# Patient Record
Sex: Female | Born: 1960 | Race: Black or African American | Hispanic: No | Marital: Married | Smoking: Never smoker
Health system: Southern US, Community
[De-identification: ages and names within clinical notes are randomized; demographics above are authoritative.]

## PROBLEM LIST (undated history)

## (undated) ENCOUNTER — Ambulatory Visit: Admission: EM | Payer: BC Managed Care – PPO | Source: Home / Self Care

## (undated) DIAGNOSIS — M25562 Pain in left knee: Secondary | ICD-10-CM

## (undated) DIAGNOSIS — J189 Pneumonia, unspecified organism: Secondary | ICD-10-CM

## (undated) DIAGNOSIS — M2550 Pain in unspecified joint: Secondary | ICD-10-CM

## (undated) DIAGNOSIS — J302 Other seasonal allergic rhinitis: Secondary | ICD-10-CM

## (undated) DIAGNOSIS — I10 Essential (primary) hypertension: Secondary | ICD-10-CM

## (undated) DIAGNOSIS — M1990 Unspecified osteoarthritis, unspecified site: Secondary | ICD-10-CM

## (undated) HISTORY — DX: Pain in unspecified joint: M25.50

## (undated) HISTORY — DX: Essential (primary) hypertension: I10

## (undated) HISTORY — DX: Pain in left knee: M25.562

---
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
## 2001-10-08 ENCOUNTER — Other Ambulatory Visit: Admission: RE | Admit: 2001-10-08 | Discharge: 2001-10-08 | Payer: Self-pay | Admitting: Obstetrics and Gynecology

## 2001-10-28 ENCOUNTER — Encounter: Payer: Self-pay | Admitting: Obstetrics and Gynecology

## 2001-10-28 ENCOUNTER — Ambulatory Visit (HOSPITAL_COMMUNITY): Admission: RE | Admit: 2001-10-28 | Discharge: 2001-10-28 | Payer: Self-pay | Admitting: Obstetrics and Gynecology

## 2002-09-23 ENCOUNTER — Other Ambulatory Visit: Admission: RE | Admit: 2002-09-23 | Discharge: 2002-09-23 | Payer: Self-pay | Admitting: Obstetrics and Gynecology

## 2002-11-17 ENCOUNTER — Ambulatory Visit (HOSPITAL_COMMUNITY): Admission: RE | Admit: 2002-11-17 | Discharge: 2002-11-17 | Payer: Self-pay | Admitting: Family Medicine

## 2002-11-17 ENCOUNTER — Encounter: Payer: Self-pay | Admitting: Family Medicine

## 2002-11-20 ENCOUNTER — Encounter: Payer: Self-pay | Admitting: Internal Medicine

## 2002-11-21 ENCOUNTER — Encounter: Payer: Self-pay | Admitting: Family Medicine

## 2002-11-21 ENCOUNTER — Inpatient Hospital Stay (HOSPITAL_COMMUNITY): Admission: EM | Admit: 2002-11-21 | Discharge: 2002-11-26 | Payer: Self-pay | Admitting: Emergency Medicine

## 2002-11-22 ENCOUNTER — Encounter: Payer: Self-pay | Admitting: Family Medicine

## 2002-11-23 ENCOUNTER — Encounter: Payer: Self-pay | Admitting: Family Medicine

## 2002-11-25 ENCOUNTER — Encounter: Payer: Self-pay | Admitting: Critical Care Medicine

## 2002-12-10 ENCOUNTER — Encounter: Payer: Self-pay | Admitting: Family Medicine

## 2002-12-10 ENCOUNTER — Ambulatory Visit (HOSPITAL_COMMUNITY): Admission: RE | Admit: 2002-12-10 | Discharge: 2002-12-10 | Payer: Self-pay | Admitting: Family Medicine

## 2003-09-15 ENCOUNTER — Ambulatory Visit (HOSPITAL_COMMUNITY): Admission: RE | Admit: 2003-09-15 | Discharge: 2003-09-15 | Payer: Self-pay | Admitting: Family Medicine

## 2003-09-15 ENCOUNTER — Encounter: Payer: Self-pay | Admitting: Family Medicine

## 2003-12-08 ENCOUNTER — Other Ambulatory Visit: Admission: RE | Admit: 2003-12-08 | Discharge: 2003-12-08 | Payer: Self-pay | Admitting: Obstetrics and Gynecology

## 2003-12-12 ENCOUNTER — Ambulatory Visit (HOSPITAL_COMMUNITY): Admission: RE | Admit: 2003-12-12 | Discharge: 2003-12-12 | Payer: Self-pay | Admitting: Family Medicine

## 2004-01-23 ENCOUNTER — Ambulatory Visit (HOSPITAL_COMMUNITY): Admission: RE | Admit: 2004-01-23 | Discharge: 2004-01-23 | Payer: Self-pay | Admitting: Family Medicine

## 2004-04-30 ENCOUNTER — Ambulatory Visit (HOSPITAL_COMMUNITY): Admission: RE | Admit: 2004-04-30 | Discharge: 2004-04-30 | Payer: Self-pay | Admitting: Family Medicine

## 2004-04-30 ENCOUNTER — Ambulatory Visit (HOSPITAL_COMMUNITY): Admission: RE | Admit: 2004-04-30 | Discharge: 2004-04-30 | Payer: Self-pay | Admitting: Otolaryngology

## 2004-05-25 ENCOUNTER — Ambulatory Visit (HOSPITAL_COMMUNITY): Admission: RE | Admit: 2004-05-25 | Discharge: 2004-05-25 | Payer: Self-pay | Admitting: Neurology

## 2004-12-11 ENCOUNTER — Ambulatory Visit: Payer: Self-pay | Admitting: Family Medicine

## 2004-12-11 ENCOUNTER — Ambulatory Visit (HOSPITAL_COMMUNITY): Admission: RE | Admit: 2004-12-11 | Discharge: 2004-12-11 | Payer: Self-pay | Admitting: Family Medicine

## 2004-12-24 ENCOUNTER — Ambulatory Visit (HOSPITAL_COMMUNITY): Admission: RE | Admit: 2004-12-24 | Discharge: 2004-12-24 | Payer: Self-pay | Admitting: Family Medicine

## 2005-03-07 ENCOUNTER — Ambulatory Visit (HOSPITAL_COMMUNITY): Admission: RE | Admit: 2005-03-07 | Discharge: 2005-03-07 | Payer: Self-pay | Admitting: Obstetrics and Gynecology

## 2005-03-07 ENCOUNTER — Encounter (INDEPENDENT_AMBULATORY_CARE_PROVIDER_SITE_OTHER): Payer: Self-pay | Admitting: *Deleted

## 2005-03-14 ENCOUNTER — Ambulatory Visit: Payer: Self-pay | Admitting: Family Medicine

## 2005-03-18 ENCOUNTER — Ambulatory Visit (HOSPITAL_COMMUNITY): Admission: RE | Admit: 2005-03-18 | Discharge: 2005-03-18 | Payer: Self-pay | Admitting: Family Medicine

## 2005-04-24 ENCOUNTER — Other Ambulatory Visit: Admission: RE | Admit: 2005-04-24 | Discharge: 2005-04-24 | Payer: Self-pay | Admitting: Obstetrics and Gynecology

## 2005-07-05 ENCOUNTER — Ambulatory Visit (HOSPITAL_COMMUNITY): Admission: RE | Admit: 2005-07-05 | Discharge: 2005-07-05 | Payer: Self-pay | Admitting: Family Medicine

## 2005-07-05 ENCOUNTER — Ambulatory Visit: Payer: Self-pay | Admitting: Family Medicine

## 2005-10-23 ENCOUNTER — Ambulatory Visit: Payer: Self-pay | Admitting: Family Medicine

## 2006-02-25 ENCOUNTER — Ambulatory Visit: Payer: Self-pay | Admitting: Family Medicine

## 2006-03-03 ENCOUNTER — Ambulatory Visit (HOSPITAL_COMMUNITY): Admission: RE | Admit: 2006-03-03 | Discharge: 2006-03-03 | Payer: Self-pay | Admitting: Family Medicine

## 2006-07-21 ENCOUNTER — Ambulatory Visit: Payer: Self-pay | Admitting: Family Medicine

## 2006-11-26 ENCOUNTER — Ambulatory Visit: Payer: Self-pay | Admitting: Family Medicine

## 2006-12-08 ENCOUNTER — Ambulatory Visit: Payer: Self-pay | Admitting: Orthopedic Surgery

## 2007-01-19 ENCOUNTER — Ambulatory Visit: Payer: Self-pay | Admitting: Orthopedic Surgery

## 2007-03-04 ENCOUNTER — Ambulatory Visit: Payer: Self-pay | Admitting: Family Medicine

## 2007-03-10 ENCOUNTER — Ambulatory Visit (HOSPITAL_COMMUNITY): Admission: RE | Admit: 2007-03-10 | Discharge: 2007-03-10 | Payer: Self-pay | Admitting: Family Medicine

## 2007-03-17 ENCOUNTER — Ambulatory Visit: Payer: Self-pay | Admitting: Orthopedic Surgery

## 2008-01-14 ENCOUNTER — Inpatient Hospital Stay (HOSPITAL_COMMUNITY): Admission: AD | Admit: 2008-01-14 | Discharge: 2008-01-14 | Payer: Self-pay | Admitting: Obstetrics and Gynecology

## 2008-01-17 ENCOUNTER — Inpatient Hospital Stay (HOSPITAL_COMMUNITY): Admission: AD | Admit: 2008-01-17 | Discharge: 2008-01-21 | Payer: Self-pay | Admitting: Obstetrics

## 2008-01-18 ENCOUNTER — Encounter (INDEPENDENT_AMBULATORY_CARE_PROVIDER_SITE_OTHER): Payer: Self-pay | Admitting: Obstetrics and Gynecology

## 2008-01-26 ENCOUNTER — Inpatient Hospital Stay (HOSPITAL_COMMUNITY): Admission: AD | Admit: 2008-01-26 | Discharge: 2008-01-28 | Payer: Self-pay | Admitting: Obstetrics

## 2008-02-01 ENCOUNTER — Ambulatory Visit: Payer: Self-pay | Admitting: Family Medicine

## 2008-04-06 DIAGNOSIS — K219 Gastro-esophageal reflux disease without esophagitis: Secondary | ICD-10-CM

## 2008-04-06 DIAGNOSIS — I10 Essential (primary) hypertension: Secondary | ICD-10-CM | POA: Insufficient documentation

## 2008-04-06 DIAGNOSIS — E669 Obesity, unspecified: Secondary | ICD-10-CM

## 2008-04-07 ENCOUNTER — Ambulatory Visit: Payer: Self-pay | Admitting: Family Medicine

## 2008-09-19 ENCOUNTER — Telehealth: Payer: Self-pay | Admitting: Family Medicine

## 2008-09-20 ENCOUNTER — Ambulatory Visit: Payer: Self-pay | Admitting: Family Medicine

## 2008-09-20 DIAGNOSIS — J111 Influenza due to unidentified influenza virus with other respiratory manifestations: Secondary | ICD-10-CM | POA: Insufficient documentation

## 2008-09-20 DIAGNOSIS — J209 Acute bronchitis, unspecified: Secondary | ICD-10-CM | POA: Insufficient documentation

## 2008-11-18 ENCOUNTER — Encounter: Payer: Self-pay | Admitting: Family Medicine

## 2008-11-21 ENCOUNTER — Telehealth: Payer: Self-pay | Admitting: Family Medicine

## 2008-12-20 ENCOUNTER — Ambulatory Visit: Payer: Self-pay | Admitting: Family Medicine

## 2008-12-20 DIAGNOSIS — E049 Nontoxic goiter, unspecified: Secondary | ICD-10-CM

## 2008-12-26 ENCOUNTER — Ambulatory Visit (HOSPITAL_COMMUNITY): Admission: RE | Admit: 2008-12-26 | Discharge: 2008-12-26 | Payer: Self-pay | Admitting: Family Medicine

## 2008-12-27 ENCOUNTER — Telehealth: Payer: Self-pay | Admitting: Family Medicine

## 2009-01-05 ENCOUNTER — Other Ambulatory Visit: Admission: RE | Admit: 2009-01-05 | Discharge: 2009-01-05 | Payer: Self-pay | Admitting: Otolaryngology

## 2009-01-05 ENCOUNTER — Encounter: Payer: Self-pay | Admitting: Family Medicine

## 2009-02-16 ENCOUNTER — Telehealth: Payer: Self-pay | Admitting: Family Medicine

## 2009-09-08 ENCOUNTER — Ambulatory Visit (HOSPITAL_COMMUNITY): Admission: RE | Admit: 2009-09-08 | Discharge: 2009-09-08 | Payer: Self-pay | Admitting: Otolaryngology

## 2009-09-11 ENCOUNTER — Encounter: Payer: Self-pay | Admitting: Family Medicine

## 2009-09-14 ENCOUNTER — Ambulatory Visit: Payer: Self-pay | Admitting: Family Medicine

## 2009-09-14 DIAGNOSIS — J0110 Acute frontal sinusitis, unspecified: Secondary | ICD-10-CM | POA: Insufficient documentation

## 2009-09-14 DIAGNOSIS — J019 Acute sinusitis, unspecified: Secondary | ICD-10-CM | POA: Insufficient documentation

## 2009-09-14 LAB — CONVERTED CEMR LAB
BUN: 21 mg/dL (ref 6–23)
CO2: 24 meq/L (ref 19–32)
Chloride: 107 meq/L (ref 96–112)
Eosinophils Relative: 2 % (ref 0–5)
Glucose, Bld: 93 mg/dL (ref 70–99)
HCT: 37.9 % (ref 36.0–46.0)
Hemoglobin: 11.8 g/dL — ABNORMAL LOW (ref 12.0–15.0)
LDL Cholesterol: 113 mg/dL — ABNORMAL HIGH (ref 0–99)
Lymphocytes Relative: 39 % (ref 12–46)
Lymphs Abs: 1.7 10*3/uL (ref 0.7–4.0)
Monocytes Absolute: 0.6 10*3/uL (ref 0.1–1.0)
Monocytes Relative: 13 % — ABNORMAL HIGH (ref 3–12)
Neutro Abs: 1.9 10*3/uL (ref 1.7–7.7)
Potassium: 4.5 meq/L (ref 3.5–5.3)
RBC: 3.91 M/uL (ref 3.87–5.11)
RDW: 13.1 % (ref 11.5–15.5)
Sodium: 144 meq/L (ref 135–145)
Total CHOL/HDL Ratio: 3.2
VLDL: 10 mg/dL (ref 0–40)
WBC: 4.3 10*3/uL (ref 4.0–10.5)

## 2009-09-15 LAB — CONVERTED CEMR LAB: Retic Ct Pct: 0.8 % (ref 0.4–3.1)

## 2009-09-18 ENCOUNTER — Encounter: Payer: Self-pay | Admitting: Family Medicine

## 2009-11-28 ENCOUNTER — Ambulatory Visit: Payer: Self-pay | Admitting: Family Medicine

## 2010-01-10 ENCOUNTER — Telehealth: Payer: Self-pay | Admitting: Family Medicine

## 2010-01-30 ENCOUNTER — Ambulatory Visit: Payer: Self-pay | Admitting: Family Medicine

## 2010-01-30 DIAGNOSIS — R51 Headache: Secondary | ICD-10-CM | POA: Insufficient documentation

## 2010-01-30 DIAGNOSIS — R519 Headache, unspecified: Secondary | ICD-10-CM | POA: Insufficient documentation

## 2010-01-31 ENCOUNTER — Telehealth: Payer: Self-pay | Admitting: Family Medicine

## 2010-02-01 ENCOUNTER — Telehealth: Payer: Self-pay | Admitting: Family Medicine

## 2010-02-01 ENCOUNTER — Telehealth (INDEPENDENT_AMBULATORY_CARE_PROVIDER_SITE_OTHER): Payer: Self-pay | Admitting: *Deleted

## 2010-02-02 DIAGNOSIS — J309 Allergic rhinitis, unspecified: Secondary | ICD-10-CM | POA: Insufficient documentation

## 2010-02-05 ENCOUNTER — Ambulatory Visit (HOSPITAL_COMMUNITY): Admission: RE | Admit: 2010-02-05 | Discharge: 2010-02-05 | Payer: Self-pay | Admitting: Family Medicine

## 2010-02-19 ENCOUNTER — Ambulatory Visit (HOSPITAL_COMMUNITY): Admission: RE | Admit: 2010-02-19 | Discharge: 2010-02-19 | Payer: Self-pay | Admitting: Family Medicine

## 2010-03-01 ENCOUNTER — Telehealth: Payer: Self-pay | Admitting: Family Medicine

## 2010-05-04 ENCOUNTER — Telehealth: Payer: Self-pay | Admitting: Family Medicine

## 2010-06-27 ENCOUNTER — Ambulatory Visit: Payer: Self-pay | Admitting: Family Medicine

## 2010-06-27 DIAGNOSIS — G4730 Sleep apnea, unspecified: Secondary | ICD-10-CM | POA: Insufficient documentation

## 2010-06-27 DIAGNOSIS — R238 Other skin changes: Secondary | ICD-10-CM | POA: Insufficient documentation

## 2010-06-29 ENCOUNTER — Encounter: Payer: Self-pay | Admitting: Family Medicine

## 2010-06-29 LAB — CONVERTED CEMR LAB
Basophils Absolute: 0 10*3/uL (ref 0.0–0.1)
CO2: 23 meq/L (ref 19–32)
Calcium: 9.7 mg/dL (ref 8.4–10.5)
Creatinine, Ser: 0.89 mg/dL (ref 0.40–1.20)
Eosinophils Absolute: 0.1 10*3/uL (ref 0.0–0.7)
Eosinophils Relative: 2 % (ref 0–5)
HDL: 47 mg/dL (ref >39)
Lymphocytes Relative: 45 % (ref 12–46)
Lymphs Abs: 1.9 10*3/uL (ref 0.7–4.0)
Neutrophils Relative %: 42 % — ABNORMAL LOW (ref 43–77)
Platelets: 345 10*3/uL (ref 150–400)
RDW: 13 % (ref 11.5–15.5)
Sodium: 142 meq/L (ref 135–145)
TSH: 2.718 microintl units/mL (ref 0.350–4.500)
Total CHOL/HDL Ratio: 3.7
Triglycerides: 55 mg/dL (ref <150)
WBC: 4.1 10*3/uL (ref 4.0–10.5)
aPTT: 29 s (ref 24–37)

## 2010-10-11 ENCOUNTER — Ambulatory Visit (HOSPITAL_COMMUNITY): Admission: RE | Admit: 2010-10-11 | Discharge: 2010-10-11 | Payer: Self-pay | Admitting: Otolaryngology

## 2010-10-29 ENCOUNTER — Ambulatory Visit: Payer: Self-pay | Admitting: Family Medicine

## 2010-10-29 DIAGNOSIS — J040 Acute laryngitis: Secondary | ICD-10-CM | POA: Insufficient documentation

## 2010-11-20 ENCOUNTER — Ambulatory Visit: Payer: Self-pay | Admitting: Family Medicine

## 2010-11-20 ENCOUNTER — Encounter: Payer: Self-pay | Admitting: Family Medicine

## 2010-11-27 ENCOUNTER — Ambulatory Visit: Payer: Self-pay | Admitting: Family Medicine

## 2010-12-30 ENCOUNTER — Encounter: Payer: Self-pay | Admitting: Neurology

## 2010-12-30 ENCOUNTER — Encounter: Payer: Self-pay | Admitting: Family Medicine

## 2011-01-08 NOTE — Progress Notes (Signed)
Summary: referral  Phone Note Call from Patient   Summary of Call: pt had to have percert for MRI of brain and number was a [phone] Initial call taken by: Rudene Anda,  February 01, 2010 10:09 AM

## 2011-01-08 NOTE — Letter (Signed)
Summary: Out of Work  [facility]  [address]   [place], [place] [address]   Phone: [phone]  Fax: [phone]    October 29, 2010   Employee:  ATLANTIS DELONG    To Whom It May Concern:   For Medical reasons, please excuse the above named employee from work for the following dates:  Start:   10/29/10  End:   10/31/10 to return with no restrictions  If you need additional information, please feel free to contact our office.         Sincerely,    Milus Mallick. Lodema Hong, MD

## 2011-01-08 NOTE — Progress Notes (Signed)
Summary: MEDS  Phone Note Call from Patient   Summary of Call: IS GOING OUT OF AND SHE FEELS HOT, COLD, CHILLS, AND ACHY WOULD YOU CALL IN SOME TAMFLU OR SOMETHING SO SHE WILL FEEL BETTER ON HER TRIP  SHE LEFT MESSAGE   CALL BACK AT [phone] Initial call taken by: Lind Guest,  January 10, 2010 10:17 AM  Follow-up for Phone Call        returned call, no answer Follow-up by: Worthy Keeler LPN,  January 10, 2010 10:46 AM  Additional Follow-up for Phone Call Additional follow up Details #1::        chills and body aches, not sure if having fever off and on cough is going out of town, needs somthing called in Additional Follow-up by: Worthy Keeler LPN,  January 10, 2010 10:52 AM    Additional Follow-up for Phone Call Additional follow up Details #2::    How long has she had these symptoms?  Is her cough productive or any sinus congestion, etc?   Follow-up by: Esperanza Sheets PA,  January 10, 2010 11:25 AM  Additional Follow-up for Phone Call Additional follow up Details #3:: Details for Additional Follow-up Action Taken: returned call, left message Additional Follow-up by: Worthy Keeler LPN,  January 11, 2010 10:05 AM  New/Updated Medications: ZITHROMAX Z-PAK 250 MG TABS (AZITHROMYCIN) uad Prescriptions: ZITHROMAX Z-PAK 250 MG TABS (AZITHROMYCIN) uad  #1 x 0   Entered by:   Worthy Keeler LPN   Authorized by:   Syliva Overman MD   Signed by:   Worthy Keeler LPN on 84/13/2440   Method used:   Electronically to        [facility] [address] [facility])       [address]       [place], [place]  [address]       Ph: [phone]       Fax: [phone]   RxID:   [id number]  symptoms since monday, clear nasal drainage, non productive cough, no sinus congestion  Advise pt that Tamiflu is for Influenza & has to be started within the 1st 48 hrs of symptoms.  I recommend that she use an OTC cough medication, increase fluids, & may use Tylenol or Ibuprofen for aches.   If she is already doing these things & needs something more for cough please advise me & I will Rx cough med for her.  Or if symptoms are worsening then she should have an appt.  Esperanza Sheets PA  January 11, 2010 11:04 AM since going out of town and hasa past h/o pneumonia requiring iCU admission erx z pack x 1 , let pt know if she actually starts tasking this and begins to worsen she needs to see an MD wherever she is   called patient, left message, Worthy Keeler LPN  January 11, 2010 2:11 PM  called patient, left message, Worthy Keeler LPN  January 12, 2010 11:27 AM  called patient, left message, Worthy Keeler LPN  January 15, 2010 8:37 AM

## 2011-01-08 NOTE — Assessment & Plan Note (Signed)
Summary: OV   Vital Signs:  Patient profile:   50 year old female Menstrual status:  regular Height:      66.5 inches Weight:      275 pounds BMI:     43.88 O2 Sat:      97 % Pulse rate:   94 / minute Pulse rhythm:   regular Resp:     16 per minute BP sitting:   120 / 80  (left arm) Cuff size:   xl  Vitals Entered By: Everitt Amber LPN (January 30, 2010 3:27 PM)  Nutrition Counseling: Patient's BMI is greater than 25 and therefore counseled on weight management options. CC: having pain,  starts in the top of her head, down into the bridge of her knows and into right ear. Lots of pain and pressure. Feels off balance   CC:  having pain, starts in the top of her head, and down into the bridge of her knows and into right ear. Lots of pain and pressure. Feels off balance.  History of Present Illness: pressure from thee crown of her head flowing down toward both sides of her nose, she has also had intermittent episodes of vertigo. symptoms started on the 10th feb returned on the 14th and has been internittent, mild sore throat, has had chills with this occasionally, no fever   Current Medications (verified): 1)  Flonase 50 Mcg/act Susp (Fluticasone Propionate) .... Two Puffs Each Nostril Bid 2)  Labetalol Hcl 200 Mg Tabs (Labetalol Hcl) .... Take 1 Tablet By Mouth Once A Day 3)  Fexofenadine Hcl 180 Mg Tabs (Fexofenadine Hcl) .... Take 1 Tablet By Mouth Two Times A Day 4)  Phentermine Hcl 37.5 Mg Tabs (Phentermine Hcl) .... Take 1 Tablet By Mouth Once A Day  Allergies (verified): No Known Drug Allergies  Review of Systems General:  Denies chills. Eyes:  Denies blurring and discharge. ENT:  Complains of nasal congestion, postnasal drainage, and sore throat; denies sinus pressure. CV:  Denies chest pain or discomfort, palpitations, and swelling of feet. Resp:  Denies cough and sputum productive. GI:  Denies abdominal pain, constipation, diarrhea, nausea, and vomiting. GU:  Denies  dysuria and urinary frequency. MS:  Denies joint pain, low back pain, mid back pain, and stiffness. Derm:  Denies itching and rash. Neuro:  Complains of headaches and sensation of room spinning; denies seizures and visual disturbances; new right sided headache in the past week, past h/o abn MRI brain ..question was raised in the past about possiblems , but tests were negative. Psych:  Denies anxiety and depression. Endo:  Denies cold intolerance, excessive hunger, excessive thirst, excessive urination, heat intolerance, polyuria, and weight change. Heme:  Denies abnormal bruising and bleeding. Allergy:  Complains of itching eyes, seasonal allergies, and sneezing; uncontrolled, pt non com[pliant with meds.  Physical Exam  General:  Well-developed,obese,in no acute distress; alert,appropriate and cooperative throughout examination HEENT: No facial asymmetry,  EOMI, No sinus tenderness, TM's Clear, oropharynx  pink and moist.erythema and edemaof nasal mucosa   Chest: Clear to auscultation bilaterally.  CVS: S1, S2, No murmurs, No S3.   Abd: Soft, Nontender.  MS: Adequate ROM spine, hips, shoulders and knees.  Ext: No edema.   CNS: CN 2-12 intact, power tone and sensation normal throughout.   Skin: Intact, no visible lesions or rashes.  Psych: Good eye contact, normal affect.  Memory intact, not anxious or depressed appearing.    Impression & Recommendations:  Problem # 1:  HEADACHE (ICD-784.0) Assessment Deteriorated  Her updated medication list for this problem includes:    Labetalol Hcl 200 Mg Tabs (Labetalol hcl) .Marland Kitchen... Take 1 tablet by mouth once a day  Future Orders: Radiology Referral (Radiology) ... 02/01/2010  Problem # 2:  OBESITY (ICD-278.00) Assessment: Improved  Ht: 66.5 (01/30/2010)   Wt: 275 (01/30/2010)   BMI: 43.88 (01/30/2010)  Problem # 3:  HYPERTENSION (ICD-401.9) Assessment: Unchanged  Her updated medication list for this problem includes:    Labetalol Hcl  200 Mg Tabs (Labetalol hcl) .Marland Kitchen... Take 1 tablet by mouth once a day  BP today: 120/80 Prior BP: 122/70 (11/28/2009)  Labs Reviewed: K+: 4.5 (09/11/2009) Creat: : 0.79 (09/11/2009)   Chol: 179 (09/11/2009)   HDL: 56 (09/11/2009)   LDL: 113 (09/11/2009)   TG: 52 (09/11/2009)  Problem # 4:  ALLERGIC RHINITIS (ICD-477.9) Assessment: Deteriorated  Her updated medication list for this problem includes:    Flonase 50 Mcg/act Susp (Fluticasone propionate) .Marland Kitchen..Marland Kitchen Two puffs each nostril bid    Fexofenadine Hcl 180 Mg Tabs (Fexofenadine hcl) .Marland Kitchen... Take 1 tablet by mouth once a day  Complete Medication List: 1)  Flonase 50 Mcg/act Susp (Fluticasone propionate) .... Two puffs each nostril bid 2)  Labetalol Hcl 200 Mg Tabs (Labetalol hcl) .... Take 1 tablet by mouth once a day 3)  Phentermine Hcl 37.5 Mg Tabs (Phentermine hcl) .... Take 1 tablet by mouth once a day 4)  Septra Ds 800-160 Mg Tabs (Sulfamethoxazole-trimethoprim) .... Take 1 tablet by mouth two times a day 5)  Fluconazole 150 Mg Tabs (Fluconazole) .... Take 1 tablet by mouth once a day 6)  Fexofenadine Hcl 180 Mg Tabs (Fexofenadine hcl) .... Take 1 tablet by mouth once a day  Other Orders: Future Orders: Radiology Referral (Radiology) ... 01/31/2010  Patient Instructions: 1)  Please schedule a follow-up appointment in 2 months. 2)  It is important that you exercise regularly at least 20 minutes 5 times a week. If you develop chest pain, have severe difficulty breathing, or feel very tired , stop exercising immediately and seek medical attention. 3)  You need to lose weight. Consider a lower calorie diet and regular exercise. Congrats on weight loss, keep it up pls 4)  you will  get a script of septra to use if needed  in the next week for sinuses. 5)  you will be referred for an mRI of the brain and mamo Prescriptions: FLUCONAZOLE 150 MG TABS (FLUCONAZOLE) Take 1 tablet by mouth once a day  #3 x 0   Entered by:   Everitt Amber LPN    Authorized by:   Syliva Overman MD   Signed by:   Adella Hare LPN on 32/44/0102   Method used:   Handwritten   RxID:   [id number] SEPTRA DS 800-160 MG TABS (SULFAMETHOXAZOLE-TRIMETHOPRIM) Take 1 tablet by mouth two times a day  #20 x 0   Entered by:   Everitt Amber LPN   Authorized by:   Syliva Overman MD   Signed by:   Adella Hare LPN on 56/38/7564   Method used:   Handwritten   RxID:   [id number] FLONASE 50 MCG/ACT SUSP (FLUTICASONE PROPIONATE) TWO PUFFS EACH NOSTRIL BID  #37MONTH x 0   Entered by:   Everitt Amber LPN   Authorized by:   Syliva Overman MD   Signed by:   Everitt Amber LPN on 16/12/930   Method used:   Electronically to        [facility]  [address]       [place], [place]  [address]       Ph: [phone]       Fax: [phone]   RxID:   [id number] PHENTERMINE HCL 37.5 MG TABS (PHENTERMINE HCL) Take 1 tablet by mouth once a day  #30 x 0   Entered and Authorized by:   Syliva Overman MD   Signed by:   Syliva Overman MD on 01/30/2010   Method used:   Printed then faxed to ...       [facility] [address] [facility] ([facility])       [address]       [place], [place]  [address]       Ph: [phone]       Fax: [phone]   RxID:   [id number] FEXOFENADINE HCL 180 MG TABS (FEXOFENADINE HCL) Take 1 tablet by mouth two times a day  #60 x 5   Entered and Authorized by:   Syliva Overman MD   Signed by:   Syliva Overman MD on 01/30/2010   Method used:   Printed then faxed to ...       [facility] [address] [facility] ([facility]ail)       [address]       [place], [place]  [address]       Ph: [phone]       Fax: [phone]   RxID:   [id number]

## 2011-01-08 NOTE — Progress Notes (Signed)
  Phone Note From Pharmacy   Caller: [facility]  [address] [id number] Summary of Call: fexofenidine dose of two times a day exceeds maximun dose, needs to be changed to once daily  Initial call taken by: Adella Hare LPN,  February 01, 2010 11:16 AM  Follow-up for Phone Call        dose changed to once daily. pls advise py Follow-up by: Syliva Overman MD,  February 01, 2010 11:46 AM  Additional Follow-up for Phone Call Additional follow up Details #1::        called patient, left message Additional Follow-up by: Adella Hare LPN,  February 01, 2010 1:47 PM    New/Updated Medications: FEXOFENADINE HCL 180 MG TABS (FEXOFENADINE HCL) Take 1 tablet by mouth once a day Prescriptions: FEXOFENADINE HCL 180 MG TABS (FEXOFENADINE HCL) Take 1 tablet by mouth once a day  #30 x 4   Entered and Authorized by:   Syliva Overman MD   Signed by:   Syliva Overman MD on 02/01/2010   Method used:   Electronically to        [facility] [address] [facility]       [address]       [place], [place]  [address]       Ph: [phone]       Fax: [phone]   RxID:   [id number]

## 2011-01-08 NOTE — Assessment & Plan Note (Signed)
Summary: F UP   Vital Signs:  Patient profile:   50 year old female Menstrual status:  regular Height:      66.5 inches Weight:      272.75 pounds BMI:     43.52 O2 Sat:      96 % on Room air Temp:     98.8 degrees F oral Pulse rate:   80 / minute Pulse rhythm:   regular Resp:     16 per minute BP sitting:   140 / 80  (left arm)  Vitals Entered By: Adella Hare LPN (October 29, 2010 4:37 PM)  Nutrition Counseling: Patient's BMI is greater than 25 and therefore counseled on weight management options.  O2 Flow:  Room air CC: follow up Is Patient Diabetic? No Pain Assessment Patient in pain? no      Comments complains of some sinus pressure   CC:  follow up.  History of Present Illness: 3 day h/o hoarseness and loss of voice , intermittent chill. Contact with sick inc walking pneumionia. Not experiencing any head congestion or nasal pressure. she has hadno fevr. she has not been invested in lifestyle cange to facilitate weight loss, and since running out of diet pills, she has gained weight which she had lost  Current Medications (verified): 1)  Labetalol Hcl 200 Mg Tabs (Labetalol Hcl) .... Take 1 Tablet By Mouth Once A Day 2)  Fexofenadine Hcl 180 Mg Tabs (Fexofenadine Hcl) .... Take 1 Tablet By Mouth Once A Day 3)  Flonase 50 Mcg/act Susp (Fluticasone Propionate) .... 2 Puffs in Each Nostril Two Times A Day  Allergies (verified): 1)  ! Septra   Impression & Recommendations:  Problem # 1:  LARYNGITIS, ACUTE (ICD-464.00) Assessment Comment Only voice rest , and fluids  Problem # 2:  ACUTE BRONCHITIS (ICD-466.0) Assessment: Comment Only  Her updated medication list for this problem includes:    Tessalon Perles 100 Mg Caps (Benzonatate) .Marland Kitchen... Take 1 capsule by mouth three times a day    Penicillin V Potassium 500 Mg Tabs (Penicillin v potassium) .Marland Kitchen... Take 1 tablet by mouth three times a day  Orders: Rocephin  250mg  (J5009) Admin of Therapeutic Inj   intramuscular or subcutaneous (38182)  Problem # 3:  OBESITY (ICD-278.00) Assessment: Unchanged  Ht: 66.5 (10/29/2010)   Wt: 272.75 (10/29/2010)   BMI: 43.52 (10/29/2010) therapeutic lifestyle change discussed and encouraged  pt to resume phentermine half daily  Problem # 4:  HYPERTENSION (ICD-401.9) Assessment: Deteriorated  Her updated medication list for this problem includes:    Labetalol Hcl 200 Mg Tabs (Labetalol hcl) .Marland Kitchen... Take 1 tablet by mouth once a day  BP today: 140/80 Prior BP: 120/82 (06/27/2010)  Labs Reviewed: K+: 4.5 (06/27/2010) Creat: : 0.89 (06/27/2010)   Chol: 173 (06/27/2010)   HDL: 47 (06/27/2010)   LDL: 115 (06/27/2010)   TG: 55 (06/27/2010)  Complete Medication List: 1)  Labetalol Hcl 200 Mg Tabs (Labetalol hcl) .... Take 1 tablet by mouth once a day 2)  Fexofenadine Hcl 180 Mg Tabs (Fexofenadine hcl) .... Take 1 tablet by mouth once a day 3)  Flonase 50 Mcg/act Susp (Fluticasone propionate) .... 2 puffs in each nostril two times a day 4)  Tessalon Perles 100 Mg Caps (Benzonatate) .... Take 1 capsule by mouth three times a day 5)  Penicillin V Potassium 500 Mg Tabs (Penicillin v potassium) .... Take 1 tablet by mouth three times a day 6)  Phentermine Hcl 37.5 Mg Tabs (Phentermine hcl) .... One  tab by mouth once daily  Patient Instructions: 1)  Please schedule a follow-up appointment in 4 months. 2)  It is important that you exercise regularly at least 20 minutes 5 times a week. If you develop chest pain, have severe difficulty breathing, or feel very tired , stop exercising immediately and seek medical attention. 3)  You need to lose weight. Consider a lower calorie diet and regular exercise. 4)  You are being treated for laryngitis and bronchitis, voice rest, salt water gargles 3 times daily, fluids and meds as discussed. 5)  injection today. 6)  Work excuse for tomorrow. 7)  Happy holidays Prescriptions: PHENTERMINE HCL 37.5 MG TABS (PHENTERMINE HCL)  one tab by mouth once daily  #30 x 1   Entered by:   Adella Hare LPN   Authorized by:   Syliva Overman MD   Signed by:   Adella Hare LPN on 16/09/9603   Method used:   Printed then faxed to ...       [facility] [address] [facility]       [address]       [place], [place]  [address]       Ph: [phone]       Fax: [phone]   RxID:   [id number] PENICILLIN V POTASSIUM 500 MG TABS (PENICILLIN V POTASSIUM) Take 1 tablet by mouth three times a day  #21 x 0   Entered and Authorized by:   Syliva Overman MD   Signed by:   Syliva Overman MD on 10/29/2010   Method used:   Electronically to        [facility] [address] [facility]       [address]       [place], [place]  [address]       Ph: [phone]       Fax: [phone]   RxID:   [id number] TESSALON PERLES 100 MG CAPS (BENZONATATE) Take 1 capsule by mouth three times a day  #21 x 0   Entered and Authorized by:   Syliva Overman MD   Signed by:   Syliva Overman MD on 10/29/2010   Method used:   Electronically to        [facility] [address] [facility]       [address]       [place], [place]  [address]       Ph: [phone]       Fax: [phone]   RxID:   [id number]    Medication Administration  Injection # 1:    Medication: Rocephin  250mg     Diagnosis: ACUTE BRONCHITIS (ICD-466.0)    Route: IM    Site: RUOQ gluteus    Exp Date: 05/14    Lot #: [id number]    Mfr: [email or web address]    Comments: rocephin 500mg  given    Patient tolerated injection without complications    Given by: Adella Hare LPN (October 29, 2010 5:08 PM)  Orders Added: 1)  Est. Patient Level IV [69485] 2)  Rocephin  250mg  [J0696] 3)  Admin of Therapeutic Inj  intramuscular or subcutaneous [96372]     Medication Administration  Injection # 1:    Medication: Rocephin  250mg     Diagnosis: ACUTE BRONCHITIS (ICD-466.0)    Route: IM    Site: RUOQ gluteus  Exp Date: 05/14    Lot  #: [id number]    Mfr: [email or web address]    Comments: rocephin 500mg  given    Patient tolerated injection without complications    Given by: Adella Hare LPN (October 29, 2010 5:08 PM)  Orders Added: 1)  Est. Patient Level IV [96295] 2)  Rocephin  250mg  [J0696] 3)  Admin of Therapeutic Inj  intramuscular or subcutaneous [28413]

## 2011-01-08 NOTE — Progress Notes (Signed)
Summary: BP MEDICINE  Phone Note Call from Patient   Summary of Call: NEEDS HER BP MEDICINE SENT TO [facility] [place] CALL BACK AT [phone] Initial call taken by: Lind Guest,  May 04, 2010 11:40 AM    Prescriptions: LABETALOL HCL 200 MG TABS (LABETALOL HCL) Take 1 tablet by mouth once a day  #30 Each x 3   Entered by:   Everitt Amber LPN   Authorized by:   Syliva Overman MD   Signed by:   Everitt Amber LPN on 19/14/7829   Method used:   Electronically to        [facility] [address] ([facility])       [address]       [place], [place]  [address]       Ph: [phone]       Fax: [phone]   RxID:   [id number]

## 2011-01-08 NOTE — Letter (Signed)
Summary: Letter  Letter   Imported By: Lind Guest 07/02/2010 12:55:15  _____________________________________________________________________  External Attachment:    Type:   Image     Comment:   External Document

## 2011-01-08 NOTE — Assessment & Plan Note (Signed)
Summary: office visit   Vital Signs:  Patient profile:   50 year old female Menstrual status:  regular Height:      66.5 inches Weight:      266.25 pounds BMI:     42.48 O2 Sat:      98 % Pulse rate:   84 / minute Pulse rhythm:   regular Resp:     16 per minute BP sitting:   120 / 82  (left arm)  Vitals Entered By: Everitt Amber LPN (June 27, 2010 9:04 AM)  Nutrition Counseling: Patient's BMI is greater than 25 and therefore counseled on weight management options. CC: Follow up chronic problems, has a bruise on left leg that she wants checked   CC:  Follow up chronic problems and has a bruise on left leg that she wants checked.  History of Present Illness: Reports  that she has been  doing well. she is now committed to regular exercise , which she states helps her weight more than anything else. She is also tolerating th phentermine well with good results.  Denies recent fever or chills. Denies sinus pressure, nasal congestion , ear pain or sore throat. Denies chest congestion, or cough productive of sputum. Denies chest pain, palpitations, PND, orthopnea or leg swelling. Denies abdominal pain, nausea, vomitting, diarrhea or constipation. Denies change in bowel movements or bloody stool. Denies dysuria , frequency, incontinence or hesitancy. Denies  joint pain, swelling, or reduced mobility. Denies headaches, vertigo, seizures. Denies depression, anxiety or insomnia. Denies  rash, lesions, or itch.     Current Medications (verified): 1)  Labetalol Hcl 200 Mg Tabs (Labetalol Hcl) .... Take 1 Tablet By Mouth Once A Day 2)  Phentermine Hcl 37.5 Mg Tabs (Phentermine Hcl) .... Take 1 Tablet By Mouth Once A Day 3)  Fexofenadine Hcl 180 Mg Tabs (Fexofenadine Hcl) .... Take 1 Tablet By Mouth Once A Day  Allergies (verified): 1)  ! Septra  Review of Systems      See HPI Eyes:  Denies blurring and discharge. Derm:  pt feels she has yeast infeection due to malodor post  exercise, under arm, and under the breasts, no rash has been seen. Endo:  Denies cold intolerance, excessive hunger, excessive thirst, excessive urination, heat intolerance, polyuria, and weight change. Heme:  Complains of abnormal bruising; left leg bruise x 3 weeks, a little discomfort initially, none at this tiome no fever or chills, no h/o direct trauma , but states she bruises easily. central area clear, my expressed opinion is that this represents an insect bite, eoith no infection currently. Allergy:  Complains of seasonal allergies; denies hives or rash and itching eyes.  Physical Exam  General:  Well-developed,obese,in no acute distress; alert,appropriate and cooperative throughout examination HEENT: No facial asymmetry,  EOMI, No sinus tenderness, TM's Clear, oropharynx  pink and moist.erythema and edemaof nasal mucosa   Chest: Clear to auscultation bilaterally.  CVS: S1, S2, No murmurs, No S3.   Abd: Soft, Nontender.  MS: Adequate ROM spine, hips, shoulders and knees.  Ext: No edema.   CNS: CN 2-12 intact, power tone and sensation normal throughout.   Skin: Intact, no  rashes. old bruise on leg, no sign of infection Psych: Good eye contact, normal affect.  Memory intact, not anxious or depressed appearing.    Impression & Recommendations:  Problem # 1:  ECCHYMOSES (ICD-782.9) Assessment Comment Only ptt to be checked  Problem # 2:  ALLERGIC RHINITIS (ICD-477.9) Assessment: Improved  The following medications were  removed from the medication list:    Flonase 50 Mcg/act Susp (Fluticasone propionate) .Marland Kitchen..Marland Kitchen Two puffs each nostril bid    Fexofenadine Hcl 180 Mg Tabs (Fexofenadine hcl) .Marland Kitchen... Take 1 tablet by mouth once a day Her updated medication list for this problem includes:    Fexofenadine Hcl 180 Mg Tabs (Fexofenadine hcl) .Marland Kitchen... Take 1 tablet by mouth once a day    Flonase 50 Mcg/act Susp (Fluticasone propionate) .Marland Kitchen... 2 puffs in each nostril two times a day  Problem #  3:  OBESITY (ICD-278.00) Assessment: Improved  Ht: 66.5 (06/27/2010)   Wt: 266.25 (06/27/2010)   BMI: 42.48 (06/27/2010)  Problem # 4:  HYPERTENSION (ICD-401.9) Assessment: Unchanged  Her updated medication list for this problem includes:    Labetalol Hcl 200 Mg Tabs (Labetalol hcl) .Marland Kitchen... Take 1 tablet by mouth once a day  Orders: T-Basic Metabolic Panel ([id number])  BP today: 120/82 Prior BP: 120/80 (01/30/2010)  Labs Reviewed: K+: 4.5 (09/11/2009) Creat: : 0.79 (09/11/2009)   Chol: 179 (09/11/2009)   HDL: 56 (09/11/2009)   LDL: 113 (09/11/2009)   TG: 52 (09/11/2009)  Problem # 5:  GERD (ICD-530.81)  Complete Medication List: 1)  Labetalol Hcl 200 Mg Tabs (Labetalol hcl) .... Take 1 tablet by mouth once a day 2)  Phentermine Hcl 37.5 Mg Tabs (Phentermine hcl) .... Take 1 tablet by mouth once a day 3)  Fexofenadine Hcl 180 Mg Tabs (Fexofenadine hcl) .... Take 1 tablet by mouth once a day 4)  Fluconazole 150 Mg Tabs (Fluconazole) .... Take 1 tablet by mouth once a day 5)  Flonase 50 Mcg/act Susp (Fluticasone propionate) .... 2 puffs in each nostril two times a day  Other Orders: T-Lipid Profile ([id number]) T-TSH [id number]) T-CBC w/Diff [phone] [id number]) T-Vitamin D (25-Hydroxy) (24401-02725) T-PTT ([id number])  Patient Instructions: 1)  Please schedule a follow-up appointment in 3.5 to 4 months. 2)  It is important that you exercise regularly at least 60 minutes 7 times a week. If you develop chest pain, have severe difficulty breathing, or feel very tired , stop exercising immediately and seek medical attention. 3)  You need to lose weight. Consider a lower calorie diet and regular exercise. goal is 12 pounds 4)  BMP prior to visit, ICD-9: 5)  Lipid Panel prior to visit, ICD-9: 6)  TSH prior to visit, ICD-9:   afsting today 7)  CBC w/ Diff prior to visit, ICD-9 8)  Vitamin D 9)  pt/pTT Prescriptions: FLONASE 50 MCG/ACT SUSP (FLUTICASONE PROPIONATE) 2 puffs in each  nostril two times a day  #14month x 4   Entered by:   Everitt Amber LPN   Authorized by:   Syliva Overman MD   Signed by:   Everitt Amber LPN on 25/95/6387   Method used:   Electronically to        [facility]  [address] (retail)       [address]       [place], [place]  [address]       Ph: [phone]       Fax: [phone]   RxID:   [id number] FEXOFENADINE HCL 180 MG TABS (FEXOFENADINE HCL) Take 1 tablet by mouth once a day  #30 x 3   Entered by:   Everitt Amber LPN   Authorized by:   Syliva Overman MD   Signed by:   Everitt Amber LPN on 62/37/6283   Method used:   Printed then faxed to .Marland KitchenMarland Kitchen  [facility]  [address] [address] (retail)       [address]       [place], [place]  [address]       Ph: [phone]       Fax: [phone]   RxID:   [id number] PHENTERMINE HCL 37.5 MG TABS (PHENTERMINE HCL) Take 1 tablet by mouth once a day  #30 x 1   Entered by:   Everitt Amber LPN   Authorized by:   Syliva Overman MD   Signed by:   Everitt Amber LPN on 44/12/270   Method used:   Printed then faxed to ...       [facility]  [address] [address] (retail)       [address]       [place], [place]  [address]       Ph: [phone]       Fax: [phone]   RxID:   [id number] FLUCONAZOLE 150 MG TABS (FLUCONAZOLE) Take 1 tablet by mouth once a day  #3 x 0   Entered and Authorized by:   Syliva Overman MD   Signed by:   Syliva Overman MD on 06/27/2010   Method used:   Electronically to        [facility]  [address] [address] (retail)       [address]       [place], [place]  [address]       Ph: [phone]       Fax: [phone]   RxID:   [id number]

## 2011-01-08 NOTE — Progress Notes (Signed)
Summary: antibotic not working  Phone Note Call from Patient   Summary of Call: pt is really congestioned and has a antibotic but not working. can dr. Lodema Hong call her in something else. [phone] Initial call taken by: Rudene Anda,  March 01, 2010 11:34 AM  Follow-up for Phone Call        states she started taking the septra prescribed last month, for sinus infection, septra doesnt agree with her, causes nausea, can you write somthing else?  Follow-up by: Adella Hare LPN,  March 01, 2010 1:15 PM  Additional Follow-up for Phone Call Additional follow up Details #1::        pls send in z pack x 1 no refills and let her know if this is not working needs ov, alsopadd to record adverse effect with septra...nausea Additional Follow-up by: Syliva Overman MD,  March 01, 2010 4:29 PM   New Allergies: ! SEPTRA Additional Follow-up for Phone Call Additional follow up Details #2::    patient aware Follow-up by: Adella Hare LPN,  March 01, 2010 4:41 PM  New/Updated Medications: ZITHROMAX Z-PAK 250 MG TABS (AZITHROMYCIN) uad New Allergies: ! SEPTRAPrescriptions: ZITHROMAX Z-PAK 250 MG TABS (AZITHROMYCIN) uad  #1 x 0   Entered by:   Adella Hare LPN   Authorized by:   Syliva Overman MD   Signed by:   Adella Hare LPN on 45/40/9811   Method used:   Electronically to        [facility]  [address] [place] [address] (retail)       [address]       [place], [place]  [address]       Ph: [phone]       Fax: [phone]   RxID:   [id number]

## 2011-01-08 NOTE — Progress Notes (Signed)
Summary: refill  Phone Note Call from Patient   Summary of Call: the pill for allergies she couldn't get. said they had to call doc back [phone] Initial call taken by: Rudene Anda,  January 31, 2010 10:05 AM  Follow-up for Phone Call        fexofenadine requires pa Follow-up by: Adella Hare LPN,  January 31, 2010 10:25 AM  Additional Follow-up for Phone Call Additional follow up Details #1::        let pt know and erx claritn or zyrtec if she agrees, explain she may have to 'fail thesebefore getting the fexofenadine Additional Follow-up by: Syliva Overman MD,  January 31, 2010 11:50 AM    Additional Follow-up for Phone Call Additional follow up Details #2::    called patient, left message Follow-up by: Everitt Amber LPN,  January 31, 2010 3:51 PM  Additional Follow-up for Phone Call Additional follow up Details #3:: Details for Additional Follow-up Action Taken: Patient states she will just pay out of pocket. RX sent to [facility]  Additional Follow-up by: Everitt Amber LPN,  January 31, 2010 4:00 PM  Prescriptions: FEXOFENADINE HCL 180 MG TABS (FEXOFENADINE HCL) Take 1 tablet by mouth two times a day  #60 x 5   Entered by:   Everitt Amber LPN   Authorized by:   Syliva Overman MD   Signed by:   Everitt Amber LPN on 56/21/3086   Method used:   Electronically to        [facility]  [address] [address] (retail)       [address]       [place], [place]  [address]       Ph: [phone]       Fax: [phone]   RxID:   [id number]

## 2011-01-10 NOTE — Assessment & Plan Note (Signed)
Summary: flu shot  Nurse Visit   Allergies: 1)  ! Septra  Immunizations Administered:  Influenza Vaccine # 1:    Vaccine Type: Fluvax Non-MCR    Site: right deltoid    Mfr: GlaxoSmithKline    Dose: 0.5 ml    Route: IM    Given by: Adella Hare LPN    Exp. Date: 06/08/2011    Lot #: [id number]    VIS given: 07/03/10 version given November 27, 2010.  Orders Added: 1)  Influenza Vaccine NON MCR [00028]

## 2011-01-10 NOTE — Assessment & Plan Note (Signed)
Summary: flu shot/slj  Nurse Visit   Allergies: 1)  ! Septra

## 2011-02-04 ENCOUNTER — Telehealth (INDEPENDENT_AMBULATORY_CARE_PROVIDER_SITE_OTHER): Payer: Self-pay | Admitting: *Deleted

## 2011-02-04 ENCOUNTER — Encounter: Payer: Self-pay | Admitting: Family Medicine

## 2011-02-04 ENCOUNTER — Ambulatory Visit (INDEPENDENT_AMBULATORY_CARE_PROVIDER_SITE_OTHER): Payer: BC Managed Care – PPO | Admitting: Family Medicine

## 2011-02-04 DIAGNOSIS — R7301 Impaired fasting glucose: Secondary | ICD-10-CM | POA: Insufficient documentation

## 2011-02-04 DIAGNOSIS — J019 Acute sinusitis, unspecified: Secondary | ICD-10-CM

## 2011-02-04 DIAGNOSIS — I10 Essential (primary) hypertension: Secondary | ICD-10-CM

## 2011-02-04 DIAGNOSIS — E669 Obesity, unspecified: Secondary | ICD-10-CM

## 2011-02-05 ENCOUNTER — Other Ambulatory Visit: Payer: Self-pay | Admitting: Family Medicine

## 2011-02-05 DIAGNOSIS — Z139 Encounter for screening, unspecified: Secondary | ICD-10-CM

## 2011-02-14 NOTE — Progress Notes (Signed)
Summary: sinus infection  Phone Note Call from Patient   Summary of Call: pt would like for doc to call her in a zpack. has a sinus infection and she feels bad and going out of town. [phone] Initial call taken by: Rudene Anda,  February 04, 2011 10:51 AM  Follow-up for Phone Call        needs to be seen,3;!5 TODAY IS OPEN PLS LET HER KNOW Follow-up by: Syliva Overman MD,  February 04, 2011 12:28 PM  Additional Follow-up for Phone Call Additional follow up Details #1::        left message on her cell # to come in at 3:15 Additional Follow-up by: Lind Guest,  February 04, 2011 1:33 PM    Additional Follow-up for Phone Call Additional follow up Details #2::    left  message Follow-up by: Lind Guest,  February 04, 2011 2:35 PM  Additional Follow-up for Phone Call Additional follow up Details #3:: Details for Additional Follow-up Action Taken: patient in the office now for a visit Additional Follow-up by: Lind Guest,  February 04, 2011 3:23 PM

## 2011-02-19 NOTE — Assessment & Plan Note (Signed)
Summary: per dr   Vital Signs:  Patient profile:   50 year old female Menstrual status:  regular Height:      66.5 inches Weight:      262 pounds BMI:     41.80 Temp:     98.7 degrees F Pulse rhythm:   regular Resp:     16 per minute BP sitting:   120 / 72  (left arm)  Vitals Entered By: Everitt Amber LPN (February 04, 2011 3:29 PM)  Nutrition Counseling: Patient's BMI is greater than 25 and therefore counseled on weight management options. CC: c/o sinus and chest congestion, chills, cough, not able to bring anything up x 4 days   CC:  c/o sinus and chest congestion, chills, cough, and not able to bring anything up x 4 days.  History of Present Illness: 1 week h/o heasd and chest congestion, nasal congestion, yellow , blooduy post nasal drainage.. body aches today, and chills, no documented fever. Reports  that prior to this she had been doing well. she is very busy between work and schookl, but has still managed to work at weight loss through lifestyle change, with some success, which is commendable.  Denies chest pain, palpitations, PND, orthopnea or leg swelling. Denies abdominal pain, nausea, vomitting, diarrhea or constipation. Denies change in bowel movements or bloody stool. Denies dysuria , frequency, incontinence or hesitancy. Denies  joint pain, swelling, or reduced mobility. Denies headaches, vertigo, seizures. Denies depression, anxiety or insomnia. Denies  rash, lesions, or itch.     Current Medications (verified): 1)  Labetalol Hcl 200 Mg Tabs (Labetalol Hcl) .... Take 1 Tablet By Mouth Once A Day 2)  Fexofenadine Hcl 180 Mg Tabs (Fexofenadine Hcl) .... Take 1 Tablet By Mouth Once A Day 3)  Phentermine Hcl 37.5 Mg Tabs (Phentermine Hcl) .... One Tab By Mouth Once Daily  Allergies (verified): 1)  ! Septra  Review of Systems      See HPI Eyes:  Denies blurring and discharge. Endo:  Denies excessive thirst and excessive urination. Heme:  Denies abnormal  bruising, bleeding, enlarge lymph nodes, and pallor. Allergy:  Complains of seasonal allergies; denies hives or rash and itching eyes.  Physical Exam  General:  Well-developedobese,in no acute distress; alert,appropriate and cooperative throughout examination HEENT: No facial asymmetry,  EOMI,right maxillary sinus tenderness, TM's Clear, oropharynx  pink and moist.   Chest: clear CVS: S1, S2, No murmurs, No S3.   Abd: Soft, Nontender.  MS: Adequate ROM spine, hips, shoulders and knees.  Ext: No edema.   CNS: CN 2-12 intact, power tone and sensation normal throughout.   Skin: Intact, no visible lesions or rashes.  Psych: Good eye contact, normal affect.  Memory intact, not anxious or depressed appearing.    Impression & Recommendations:  Problem # 1:  ACUTE SINUSITIS, UNSPECIFIED (ICD-461.9) Assessment Comment Only  The following medications were removed from the medication list:    Flonase 50 Mcg/act Susp (Fluticasone propionate) .Marland Kitchen... 2 puffs in each nostril two times a day    Tessalon Perles 100 Mg Caps (Benzonatate) .Marland Kitchen... Take 1 capsule by mouth three times a day    Penicillin V Potassium 500 Mg Tabs (Penicillin v potassium) .Marland Kitchen... Take 1 tablet by mouth three times a day Her updated medication list for this problem includes:    Zithromax 250 Mg Tabs (Azithromycin) .Marland Kitchen..Marland Kitchen Two tablets on day one , and one tablet daily for an additional 4 days    Tessalon Perles 100 Mg Caps (  Benzonatate) .Marland Kitchen... Take 1 capsule by mouth three times a day  Orders: Rocephin  250mg  (V4098) Admin of Therapeutic Inj  intramuscular or subcutaneous (11914)  Problem # 2:  HYPERTENSION (ICD-401.9) Assessment: Improved  BP today: 120/72 Prior BP: 140/80 (10/29/2010)  Labs Reviewed: K+: 4.5 (06/27/2010) Creat: : 0.89 (06/27/2010)   Chol: 173 (06/27/2010)   HDL: 47 (06/27/2010)   LDL: 115 (06/27/2010)   TG: 55 (06/27/2010)  Problem # 3:  OBESITY (ICD-278.00) Assessment: Improved  Ht: 66.5  (02/04/2011)   Wt: 262 (02/04/2011)   BMI: 41.80 (02/04/2011)  Complete Medication List: 1)  Labetalol Hcl 200 Mg Tabs (Labetalol hcl) .... Take 1 tablet by mouth once a day 2)  Fexofenadine Hcl 180 Mg Tabs (Fexofenadine hcl) .... Take 1 tablet by mouth once a day 3)  Zithromax 250 Mg Tabs (Azithromycin) .... Two tablets on day one , and one tablet daily for an additional 4 days 4)  Tessalon Perles 100 Mg Caps (Benzonatate) .... Take 1 capsule by mouth three times a day 5)  Phentermine Hcl 37.5 Mg Tabs (Phentermine hcl) .... Take 1 tablet by mouth once a day  Other Orders: T-Basic Metabolic Panel [id number]) T-Lipid Profile [phone]) T-CBC w/Diff [id number]) T- Hemoglobin A1C ([id number]) T-TSH ([id number]) Future Orders: Radiology Referral (Radiology) ... 02/05/2011  Patient Instructions: 1)  Please schedule a follow-up appointment in 4 months. 2)  It is important that you exercise regularly at least 20 minutes 5 times a week. If you develop chest pain, have severe difficulty breathing, or feel very tired , stop exercising immediately and seek medical attention. 3)  You need to lose weight. Consider a lower calorie diet and regular exercise.  4)  You are being treated for right maxillary sinusitis, you will get injection in the office Prescriptions: LABETALOL HCL 200 MG TABS (LABETALOL HCL) Take 1 tablet by mouth once a day  #30 Each x 4   Entered by:   Everitt Amber LPN   Authorized by:   Syliva Overman MD   Signed by:   Everitt Amber LPN on 56/38/7564   Method used:   Electronically to        [facility]  [address] (retail)       [address]       [place], [place]  [address]       Ph: [phone]       Fax: [phone]   RxID:   [id number] PHENTERMINE HCL 37.5 MG TABS (PHENTERMINE HCL) Take 1 tablet by mouth once a day  #30 x 3   Entered and Authorized by:   Syliva Overman MD   Signed by:   Syliva Overman MD on 02/04/2011   Method used:    Printed then faxed to ...       [facility]  [address] [address] (retail)       [address]       [place], [place]  [address]       Ph: [phone]       Fax: [phone]   RxID:   [id number] TESSALON PERLES 100 MG CAPS (BENZONATATE) Take 1 capsule by mouth three times a day  #30 x 0   Entered and Authorized by:   Syliva Overman MD   Signed by:   Syliva Overman MD on 02/04/2011   Method used:   Electronically to        [facility]  [address] [place] [address] (retail)       [address]       [place], [place]  [address]       Ph: [phone]       Fax: [phone]   RxID:   [id number] ZITHROMAX 250 MG TABS (AZITHROMYCIN) two tablets on day one , and one tablet daily for an additional 4 days  #6 x 0   Entered and Authorized by:   Syliva Overman MD   Signed by:   Syliva Overman MD on 02/04/2011   Method used:   Electronically to        [facility] [address] (retail)       [address]       [place], [place]  [address]       Ph: [phone]       Fax: [phone]   RxID:   [id number]    Medication Administration  Injection # 1:    Medication: Rocephin  250mg     Diagnosis: ACUTE SINUSITIS, UNSPECIFIED (ICD-461.9)    Route: IM    Site: LUOQ gluteus    Exp Date: 07/2013    Lot #: [id number]    Mfr: [email or web address]    Comments: 500mg  given     Patient tolerated injection without complications    Given by: Everitt Amber LPN (February 04, 2011 4:23 PM)  Orders Added: 1)  Est. Patient Level IV [99214] 2)  T-Basic Metabolic Panel [phone] 3)  T-Lipid Profile [80061-22930] 4)  T-CBC w/Diff [28315-17616] 5)  T- Hemoglobin A1C [83036-23375] 6)  T-TSH [[id number]] 7)  Rocephin  250mg  [J0696] 8)  Admin of Therapeutic Inj  intramuscular or subcutaneous [96372] 9)  Radiology Referral [Radiology]     Medication Administration  Injection # 1:    Medication: Rocephin  250mg     Diagnosis: ACUTE SINUSITIS, UNSPECIFIED (ICD-461.9)    Route:  IM    Site: LUOQ gluteus    Exp Date: 07/2013    Lot #: [id number]    Mfr: [email or web address]    Comments: 500mg  given     Patient tolerated injection without complications    Given by: Everitt Amber LPN (February 04, 2011 4:23 PM)  Orders Added: 1)  Est. Patient Level IV [99214] 2)  T-Basic Metabolic Panel [[id number]] 3)  T-Lipid Profile [80061-22930] 4)  T-CBC w/Diff [70350-09381] 5)  T- Hemoglobin A1C [83036-23375] 6)  T-TSH [82993-71696] 7)  Rocephin  250mg  [J0696] 8)  Admin of Therapeutic Inj  intramuscular or subcutaneous [96372] 9)  Radiology Referral [Radiology]

## 2011-02-25 ENCOUNTER — Ambulatory Visit (HOSPITAL_COMMUNITY): Payer: BC Managed Care – PPO

## 2011-03-04 ENCOUNTER — Ambulatory Visit: Payer: Self-pay | Admitting: Family Medicine

## 2011-04-23 NOTE — Op Note (Signed)
[name], [name]                ACCOUNT NO.:  [id number]   MEDICAL RECORD NO.:  [id number]          PATIENT TYPE:  INP   LOCATION:  9144                          FACILITY:  [facility]   PHYSICIAN:  Maxie Better, M.D.DATE OF BIRTH:  [birth year]   DATE OF PROCEDURE:  01/18/2008  DATE OF DISCHARGE:                               OPERATIVE REPORT   PREOPERATIVE DIAGNOSIS:  Arrest of dilatation, nonreassuring fetal  tracing, term gestation.   PROCEDURE:  Primary cesarean section, Kerr hysterotomy.   POSTOPERATIVE DIAGNOSIS:  Fibroid uterus, arrest of dilatation,  nonreassuring fetal heart tracing, term gestation, occiput posterior  presentation.   ANESTHESIA:  Epidural.   SURGEON:  Maxie Better, M.D.   ASSISTANT:  [name], C.N.M.   PROCEDURE IN DETAIL:  Under adequate epidural anesthesia, the patient is  placed in the supine position with a left lateral tilt.  An indwelling  Foley catheter was already in place and was draining concentrated urine.  The patient was prepped and draped in the usual fashion.  0.25% Marcaine  was injected in the planned Pfannenstiel skin incision site.  A  Pfannenstiel skin incision was then made and carried down to the rectus  fascia.  The rectus fascia was opened transversely.  The rectus fascia  was then bluntly and sharply dissected off the rectus muscle in a  superior inferior fashion. The rectus muscles were split in the midline,  the parietal peritoneum was entered bluntly and extended.  Copious fluid  was initially encountered.  The vesicouterine peritoneum was opened  transversely. The bladder was then bluntly dissected off the lower  uterine segment and displaced inferiorly.  A curvilinear low transverse  uterine incision was then made and extended with bandage scissors. On  entering the uterine cavity, the fetal head was encountered.  A live  female was delivered from right occiput posterior presentation.  The baby  was bulb  suctioned on the abdomen, the cord was clamped, the baby was  transferred to the awaiting pediatrician who assigned Apgars 6 and 9 at  1 and 5 minutes.  The placenta was removed and sent to pathology intact.  It had some calcification.  The uterine cavity was cleaned of debris.  The uterine incision had no extension.  It was closed in two layers, the  first layer 0 Monocryl running locked stitch, the second layer was  imbricated using 0 Monocryl suture.  Intermittent bleeding sites were  then hemostased with 0 Monocryl figure-of-eight sutures.  The abdomen  was then copiously irrigated and suctioned of debris.  Normal tubes and  ovaries were noted bilaterally.  There was a pedunculated about 3 cm  right anterior fundal fibroid noted and a  smaller subserosal fibroid  seen anteriorly.  There was a 6 cm posterior serosal fibroid in the mid  body of the uterus.  The paracolic gutters were cleaned of debris.  Good  hemostasis was noted along the incision site.  Small bleeders were  cauterized.  The parietoperitoneum was closed with 2-0 Vicryl in a  running stitch.  The rectus fascia was closed with 0 Vicryl x2.  The  subcutaneous was irrigated and small bleeders cauterized.  The skin was  approximated using Ethicon staples.   SPECIMENS:  Placenta sent to pathology.   ESTIMATED BLOOD LOSS:  1200 mL.   INTRAOPERATIVE FLUIDS:  2600 mL.   URINE OUTPUT:  100 mL.   COUNTS:  Sponge and instrument counts x2 was correct.   COMPLICATIONS:  None.   Weight of the baby was 6 pounds 11 ounces.  The patient tolerated the  procedure well and was transferred to recovery in stable condition.      Maxie Better, M.D.  Electronically Signed     [place]/MEDQ  D:  01/18/2008  T:  01/19/2008  Job:  [id number]

## 2011-04-26 NOTE — Group Therapy Note (Signed)
   [name], [name]                          ACCOUNT NO.:  [id number]   MEDICAL RECORD NO.:  [id number]                   PATIENT TYPE:  INP   LOCATION:  A219                                 FACILITY:  [facility]   PHYSICIAN:  Edward L. Juanetta Gosling, M.D.             DATE OF BIRTH:  10/21/1961   DATE OF PROCEDURE:  DATE OF DISCHARGE:                                   PROGRESS NOTE   SUBJECTIVE:  The patient says she is feeling better.  She has no new  complaints and says that she had a pretty good night last night.  She is  coughing up some sputum now.   OBJECTIVE:  Her examination today shows that her temperature is 99.5, pulse  85, respirations 20, blood pressure 120/59, O2 saturation 93%; this on mask  O2.  Her I&O +837.  Her chest shows bilateral rhonchi that is no worse than  it was.  Her heart is regular. Her abdomen is soft.   ASSESSMENT:  She does seem to be doing a little bit better.   PLAN:  To continue with her treatments.  She has lab work pending.                                               Edward L. Juanetta Gosling, M.D.    [facility]/MEDQ  D:  11/22/2002  T:  11/23/2002  Job:  [id number]

## 2011-04-26 NOTE — H&P (Signed)
[name], [name]                          ACCOUNT NO.:  [id number]   MEDICAL RECORD NO.:  [id number]                   PATIENT TYPE:  INP   LOCATION:  A219                                 FACILITY:  [facility]   PHYSICIAN:  Milus Mallick. Lodema Hong, M.D.           DATE OF BIRTH:  [birth year]   DATE OF ADMISSION:  11/20/2002  DATE OF DISCHARGE:                                HISTORY & PHYSICAL   CHIEF COMPLAINT:  This patient is a 50 year old African-American female who  presents with a 2-week history of generalized malaise, postnasal drainage,  neck pain, headache, and multiple aches. The patient reports having received  a flu shot earlier in this flu season. She was initially seen by her family  M.D., Dr. Lodema Hong.  Since her admission, she was started on Folex-D, as well  as Septra, one twice daily for sinus infection.  She again returned to the  office one week later, stating that she felt progressively worse with  periods of weakness, chills, and low-grade fever.  At that time, it was  decided to do x-rays of her chest and sinuses and some basic lab work.  Significant lab work showed a white cell count of 1.9.  This was thought to  be attributed to the Septra at that time, and she was started on amantidine  100 mg twice daily for presumed viral infection.  Of note, her chest x-ray  on December 10 showed clear lung fields, and her sinus films initially  showed chronic sinusitis.   PAST MEDICAL HISTORY:  The patient's past medical history is  noncontributory. She is an otherwise healthy female with no chronic medical  illnesses.   PAST SURGICAL HISTORY:  Significant for excision of a pilonidal sinus.   ALLERGIES:  No known drug allergies.   MEDICATIONS:  Folex-D one twice daily, Septra-DS one twice a day (took for  one week total, was then discontinued) and started on amantidine 100 mg  twice a daily.   SOCIAL HISTORY:  The patient is single.  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  She denies any tobacco or alcohol use.   PHYSICAL EXAMINATION:  GENERAL:  At the time of her admission, she was an  ill-appearing female, basically lying flat on a stretcher.  HEENT: Head is normocephalic.  Extraocular movements are intact.  Oropharynx  showed no cervical adenopathy.  CHEST:  Chest exam revealed bilateral crackles in the mid to lower lobes of  both lungs with __________  air entry in the bases.  CARDIOVASCULAR:  Heart sounds 1 and 2 are normal. No S3.  ABDOMEN:  Soft is no localized tenderness, guarding, or rebound.  EXTREMITIES:  Negative for significant edema or calf tenderness.   LABORATORY DATA:  On admission is as follows:  Urinalysis totally negative.  Her ABG on room air showed a pH of 7.42 with a CO2 of 31.6, pO2 of 51.2  with  87 percent sat,  On 3 liters her pH is 7.42, pCO2 of 43.2, pO2 of 67.8 with  92.8 percent saturation.  Lab data was, otherwise, within normal and is as  follows:  CBC: the patient's white cell count is within normal at 5.5 with a  hemoglobin of 12.3, platelet count of 212, and she has 78% neutrophils and  lymphocytes normal at 12.  Chemistries:  sodium 131, potassium 3.3, chloride  103, CO2 27, glucose 115, BUN 4, creatinine 0.8, total bilirubin 0.4,  alkaline phosphatase 55, SGOT mildly elevated at 46, SGPT mildly elevated at  50, total protein 7.2, albumin low at 3.1, and calcium 8.8.  A hepatitis  panel will be added though an elevation in her liver enzymes was likely due  to fatty liver.  Two sets of blood cultures have been sent, both of which  are negative.  A chest x-ray, which was done on presentation to the  emergency room, is still not read officially.  However, __________  patchy  infiltrates.   ASSESSMENT AND PLAN:  Acute hypoxia with symptoms of pneumonia in a patient  who has failed outpatient therapy.  She will have aggressive pulmonary  toilet with additional Mucomyst meds q.8h. for  72 hours and  albuterol and  Atrovent nebs q.8h.  She will require supplemental oxygen to maintain sats  within 92%.  She is being put on IV Unasyn 1.5 gm q.6h. and Zithromax 500 mg  daily, as well as Solu-Medrol IV 80 mg q.8h.  Use __________  for closure if  available.  She is to have a spiral CT of her chest to rule out a PE, and  she will be maintained on amantidine 100 mg twice daily. Dr. Juanetta Gosling has  been formally consulted, and he agrees with the management and recommends  that she have Legionella and mycoplasma titers added to her lab data.                                               Milus Mallick. Lodema Hong, M.D.    MES/MEDQ  D:  11/21/2002  T:  11/21/2002  Job:  [id number]

## 2011-04-26 NOTE — Consult Note (Signed)
NAMERYAN, PALERMO                          ACCOUNT NO.:  [id number]   MEDICAL RECORD NO.:  [id number]                   PATIENT TYPE:  INP   LOCATION:  A219                                 FACILITY:  [facility]   PHYSICIAN:  Edward L. Juanetta Gosling, M.D.             DATE OF BIRTH:  [birth year]   DATE OF CONSULTATION:  11/21/2002  DATE OF DISCHARGE:                                   CONSULTATION   INTERNAL MEDICINE CONSULTATION:   REASON FOR CONSULTATION:  Pneumonia.   SUBJECTIVE:  The patient is a 50 year old who had been previously healthy  and who had been feeling fairly well.  She had had the flu for about 2 weeks  and eventually had seen Dr. Lodema Hong, was treated initially for what appeared  to be a sinus infection, later for more of a flu-like syndrome.  She  developed fever and increasing shortness of breath over the next several  days and came to the emergency room where she was found to have bilateral  pneumonia and she was hypoxemic as well.  She says that she still feels  short of breath and just does not feel quite right.   PAST MEDICAL HISTORY:  Her past medical history is negative for any sort of  lung problems.  She has not had any problems with asthma, COPD, or any other  complaints.   CURRENT MEDICATIONS:  She takes no medications chronically.   FAMILY HISTORY:  Her family history is positive for diabetes and there is a  family member who has had severe COPD but was a long-standing cigarette  smoker.   REVIEW OF SYSTEMS:  Her review of systems except as mentioned is negative.  She has not had any myalgias.  She has had a severe headache.   PHYSICAL EXAMINATION:  CHEST:  Her chest has marked bilateral rhonchi and  some rales.  GENERAL:  She does not appear to be in acute distress but she does look  sick.  VITAL SIGNS:  Temperature to 101, blood pressure 100/70, pulse is 90.  HEENT:  Her mucous membranes are slightly dry.  Her tympanic membranes are  intact.  CARDIAC:  Her heart is regular without gallop.  ABDOMEN:  Her abdomen is soft.  EXTREMITIES:  She does not have any clubbing, cyanosis, or edema.   LABORATORY DATA:  Her laboratory work shows that she was hypoxemic with a  pO2 in the 50s on admission.  She is now not hypoxemic but is on  supplemental O2.  Chest x-ray shows diffuse bilateral infiltrates.   ASSESSMENT AND PLAN:  She is an otherwise healthy woman who now has diffuse  bilateral infiltrates and this is suggestive of one of the atypical  pneumonias.  Certainly, I would plan to use Zithromax for this.  I am going  to  go ahead and order new Mycoplasma and Legionella titers otherwise would plan  to  continue treatments as you have already done.  She is already on  __________  maximum medical therapy at this point.  Thank you very much for  allowing me to see her with you.                                               Edward L. Juanetta Gosling, M.D.    [facility]/MEDQ  D:  11/21/2002  T:  11/22/2002  Job:  [id number]

## 2011-04-26 NOTE — H&P (Signed)
[name], [name]                ACCOUNT NO.:  [id number]   MEDICAL RECORD NO.:  [id number]           PATIENT TYPE:   LOCATION:                                 FACILITY:   PHYSICIAN:  Naima A. Dillard, M.D. DATE OF BIRTH:  09-21-1961   DATE OF ADMISSION:  DATE OF DISCHARGE:                                HISTORY & PHYSICAL   CHIEF COMPLAINT:  First trimester bleeding.   HISTORY OF PRESENT ILLNESS:  The patient is a 50 year old African-American  female, gravida 1, para 0, whose last menstrual period was January 15, 2005,  who presented on February 25, 2005 with spotting.  The patient denied having  any severe abdominal pain and still denies it.  She thinks that she passed  some tissue on Saturday but still has spotting and desires a D&C.  On March  21 her beta was 2008.7.  On March 22 it was 1712.3, and on March 25 it was  1983.1.  She had an ultrasound which showed an irregular sac measuring five  weeks, two days but no fetal pole or yolk sac.  Uterus with multiple  fibroids and about a 3.1 cm right adnexal cyst versus a mass, could not rule  out ectopic pregnancy.  The patient denied having any severe abdominal pain.   PAST MEDICAL HISTORY:  Unremarkable.   PAST SURGICAL HISTORY:  Significant for a pilonidal cyst removed.   MEDICATIONS:  None.   SOCIAL HISTORY:  Negative for alcohol or tobacco or other illicit drug use.   ALLERGIES:  The patient has no known drug allergies.   PHYSICAL EXAMINATION:  VITAL SIGNS:  The patient's blood pressure is 126/66,  weight is 265.  HEENT:  Pupils are equal, hearing is normal, throat is clear.  NECK:  Thyroid is not enlarged.  HEART:  Regular rate and rhythm.  CHEST:  Clear.  BACK:  No CVA tenderness bilaterally.  ABDOMEN:  Nontender without any mass or organomegaly.  EXTREMITIES:  No cyanosis, clubbing, or edema.  NEUROLOGIC:  Within normal limits.  PELVIC:  Vaginal exam within normal limits.  Cervix was nontender, no  lesions, long  and closed.  Uterus was about 10 weeks' size and regular.  There is some scant blood in the vault.  Adnexa:  There are no masses,  nontender on exam.   LABORATORY DATA:  Pertinent laboratory data is as above.   REVIEW OF SYSTEMS:  GENITOURINARY:  As above.  Musculoskeletal, endocrine,  psychiatric, gastrointestinal, cardiovascular are all unremarkable.   ASSESSMENT:  Missed abortion versus ectopic pregnancy.  The patient  understands this is an abnormal pregnancy and she desires a D&C.  The  patient was told that there is a chance that she has an ectopic pregnancy.  She was given the options of another repeat beta quant with her ultrasound  and observation as the betas were falling.  The second option was a D&C and  if no villi, a laparoscopy or D&C if no villi, methotrexate if her labs are  normal.  The patient has agreed to proceed with a D&C and methotrexate.  Risk of the procedure, bleeding, infection, damage to internal organs such  as bowel, bladder, major blood vessels, perforation of the uterus,  Asherman's syndrome were all reviewed with the patient and her husband, and  they agree with the plan of D&C, if no villi, methotrexate.  The patient's  blood type is AB positive and her hemoglobin was 11.3.      NAD/MEDQ  D:  03/06/2005  T:  03/06/2005  Job:  [id number]

## 2011-04-26 NOTE — Discharge Summary (Signed)
NAMEALEXSYS, ESKIN                          ACCOUNT NO.:  [id number]   MEDICAL RECORD NO.:  [id number]                   PATIENT TYPE:  INP   LOCATION:  A219                                 FACILITY:  [facility]   PHYSICIAN:  Milus Mallick. Lodema Hong, M.D.           DATE OF BIRTH:  05-29-1961   DATE OF ADMISSION:  11/20/2002  DATE OF DISCHARGE:  11/23/2002                                 DISCHARGE SUMMARY   DISCHARGE DIAGNOSES:  1. Severe bilateral pneumonia.  2. Obesity.   HISTORY OF PRESENT ILLNESS:  The patient is a 50 year old African American  female who presented with a two week history of generalized malaise,  postnasal drainage, neck pain, headache and multiple aches.  The patient had  received a flu shot earlier in this flu season.  She was initially seen by  her family M.D., and started on Septra and Prolex D  for some complaints of  sinus symptoms one week prior to her admission.  The following week she  again returned to the office stating that instead of feeling worse, she felt  better but was still having feelings of weakness, chills and low grade  fever.  At that time, we had x-rays of her chest and sinuses done and basic  lab work.  There were no significant lab data.  She had a white cell count  of 1.9 which I felt may have been due to the Septra.  I called the patient  about that.  Otherwise, her CBC and chemistry were within normal.  Of  interest, her chest x-ray at that time was totally clear.  This was done on  November 17, 2002.  Her sinus films showed nonchronic sinusitis.  The  patient then presented to the emergency room on November 20, 2002, saying  that she felt significantly weak.  At that time, she still did not have much  of a cough and she denied any significant shortness of breath.  Lab data in  the emergency room showed diffuse bilateral pneumonia with room air oxygen  of 52, and she was admitted for management of acute pneumonia.   PAST MEDICAL HISTORY:   Negative.  The patient is otherwise healthy and only  suffers from morbid obesity.   PAST SURGICAL HISTORY:  Significant for excision of a pilonidal cyst.   ALLERGIES:  No known drug allergies.   MEDICATIONS:  1. Medications that she had taken recently were Prolex D one twice daily and     Septra DS one twice daily.  She took these for one week.  2. When she presented to the emergency room she had been on Amantidine 100     mg twice daily for three days.   SOCIAL HISTORY:  She is single.  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  She denies tobacco or alcohol use.   PHYSICAL EXAMINATION:  GENERAL:  At the time  of admission, she was an ill-  appearing female lying flat on the stretcher.  VITAL SIGNS:  Her vital signs were within normal limits.  HEENT:  Extraocular muscles are intact.  No facial asymmetry.  Oropharynx  moist.  NECK:  She had no cervical adenopathy.  CHEST:  The chest exam revealed bilateral crackles in the mid to lower  lobes, lung fields.  There was marked __________ air entry in the bases  bilaterally.  CARDIOVASCULAR:  Heart sounds S1 and S2 heard.  No S3  is heard.  ABDOMEN:  Soft.  There is no localized tenderness, guarding or rebound.  EXTREMITIES:  Negative for edema.   LABORATORY DATA:  On admission, urinalysis negative.  Blood gas on room air,  pH 7.42, CO2 31.6, pO2 51.2, with 87% sat. On three liters, her pH was 7.42,  pCO2 43.2, pO2 57.8 with 92.8% sat.  Lab data otherwise within normal  limits.  WBC 5.5, HB 12.3, platelet count 212, and 78% neutrophils.  Chemistries:  Sodium 131, potassium 3.3, chloride 103, CO2 27, BUN 4,  creatinine 0.8 and glucose 115.  Hepatic panel showed mild elevation of SGOT  of 46 and SGPT of 50.  Albumin was mildly decreased with a total of 3.1.  Presenting chest x-ray on the date of her admission showed extensive  bilateral pneumonia.  Spiral CT of the chest was done to rule out an embolus  and was  negative.   HOSPITAL COURSE:  The patient was admitted to the medical floor and she was  started on IV Unasyn and Zithromax. Continued on Amantidine orally.  She  also received steroids parenterally and aggressive pulmonary toilet with  albuterol, Atrovent, Mucomyst nebs three times daily.   Since her admission, her condition has deteriorated.  She is now requiring  oxygen at 50% face mask, and her blood gases on the 50% face mask revealed  pH 7.42, pCO2 39.4, pO2 67.4, with 93% saturation.  Her chemistry is sodium  140, potassium 3.8, chloride 103, CO2 26, BUN 13, creatinine 0.9, glucose  141.  CBC:  Her hemoglobin is 11.1 with neutrophils 86%.  White cell count  11.6, platelets 309,000.  Chest x-ray this morning shows progression of her  pneumonic process.   In summary, the patient is admitted with acute __________ bilateral  pneumonia which continues to worsen.  We have discussed with Dr. Delford Field at  [facility], and he has suggested that we add vancomycin to her medications  and at this time of this dictation a bed is being identified for her  transfer to [facility] as soon as possible on the pulmonary  service.   DISCHARGE MEDICATIONS:  1. Solu-Medrol 80 mg IV q.8h.  2. Protonix 40 mg p.o. q.d.  3. Robitussin 10 ml p.o. t.i.d.  4. Albuterol, Atrovent, Mucomyst nebulizers q.8h.  5. Amantidine 100 mg twice daily.  6. Ambien 5 mg at bedtime.  7. Xanax 1 mg at bedtime.  8. __________  500 mg daily.  9. Unasyn 1.5 g q.6h.  10.      She is on p.r.n. Tylenol and Phenergan.                                               Milus Mallick. Lodema Hong, M.D.    MES/MEDQ  D:  11/23/2002  T:  11/23/2002  Job:  [id number]  

## 2011-04-26 NOTE — Op Note (Signed)
NAMEJOHONNA, BINETTE                ACCOUNT NO.:  [id number]   MEDICAL RECORD NO.:  [id number]          PATIENT TYPE:  AMB   LOCATION:  SDC                           FACILITY:  [facility]   PHYSICIAN:  Naima A. Dillard, M.D. DATE OF BIRTH:  08-21-1961   DATE OF PROCEDURE:  03/07/2005  DATE OF DISCHARGE:                                 OPERATIVE REPORT   PREOPERATIVE DIAGNOSIS:  Missed abortion versus ectopic pregnancy.   POSTOPERATIVE DIAGNOSIS:  Missed abortion versus ectopic pregnancy.   OPERATION:  Dilation and evacuation.   SURGEON:  Naima A. Dillard, M.D.   ASSISTANT:  None.   ANESTHESIA:  MAC.   SPECIMENS:  Products of conception sent to pathology.   ESTIMATED BLOOD LOSS:  Minimal.   COMPLICATIONS:  None.   DISPOSITION:  Patient went to the PACU in stable condition.   DESCRIPTION OF PROCEDURE:  Before the procedure, in the PACU, the patient's  laboratory studies were reviewed and her beta quantitative was noted to be  2, which was a dramatic drop from her last beta quantitative, which was  1983.1.  Because of this, it was thought that she probably did pass tissue  over the weekend and that even if villi were not present, we would not give  methotrexate unless she plateaued her beta quantitative so she would have  another beta quantitative next week with me.  She would still be on pain and  bleeding precautions and come to the emergency room or call.  The patient  understands that she still may have an ectopic and there is still a risk of  rupture.  She still agreed to proceed with the D&E and if no villi,  methotrexate only of the beta quantitative plateaus or shows a dramatic  drop.   The patient was taken to the operating room, given MAC anesthesia, placed in  dorsal lithotomy position, prepped and draped in normal sterile fashion.  The bladder was drained.  The patient's uterus was about 10-week size and  irregular, no adnexal masses were palpated.  A bivalved speculum  was placed  into the vagina.  The anterior lip of the cervix was grasped with a single-  tooth tenaculum.  Then 20 mL of 1% lidocaine was used for cervical block.  The cervix was then dilated with Shawnie Pons dilators up to 21.  A size 7 suction  curette was placed into the uterine cavity.  It was noted to be irregular,  but suction was done until a gritty texture was noted.  Suction was removed  and a sharp curettage was done along the uterine cavity, all four walls.  There was some irregularity noted, which was probably consistent with  fibroids.  The sharp curette was removed.  The suction curette was placed to  remove any blood.  All  instruments were removed from the vagina.  The tenaculum site was noted to  have some bleeding, which was made hemostatic with silver nitrate.  Sponge,  lap, and needle counts were correct x 2.  The patient was returned to the  recovery room in stable condition.  NAD/MEDQ  D:  03/07/2005  T:  03/07/2005  Job:  [id number]

## 2011-04-26 NOTE — Discharge Summary (Signed)
[name], [name]                          ACCOUNT NO.:  [id number]   MEDICAL RECORD NO.:  [id number]                   PATIENT TYPE:  INP   LOCATION:  5742                                 FACILITY:  [facility]   PHYSICIAN:  Oley Balm. Sung Amabile, M.D. [facility]          DATE OF BIRTH:  1961-10-20   DATE OF ADMISSION:  11/23/2002  DATE OF DISCHARGE:  11/26/2002                                 DISCHARGE SUMMARY   DISCHARGE DIAGNOSIS:  Acute respiratory distress with suspected community-  acquired pneumonia, no organism specified, versus acute viral syndrome.   HISTORY OF PRESENT ILLNESS:  The patient is a 50 year old African American  who has no chronic health problem, who had never smoked, who complained of a  sinus infection in late November and was treated with antibiotics, but  questionable as to which one.  On November 20, 2002, she was admitted [facility] with fever and shortness of breath, with some new  bilateral infiltrates on chest x-ray and was presumed to have pneumonia on  November 17, 2002.  She proved refractory to treatment a [facility]  and was transferred to [facility] to the intensive care ward and to  the care of the pulmonary critical care specialists.   LABORATORY DATA:  Legionella ________ was negative.  WBC was 11.8,  hemoglobin 11.3, hematocrit 32.6, platelets 347,000.  ESR was 80.  LDH was  293.  B-type natriuretic peptide was 79.  Legionella direct was negative.   RADIOGRAPHIC DATA:  Chest x-ray shows bilateral pulmonary opacities  consistent with pneumonia.   CARDIOLOGY DATA:  None.   HOSPITAL COURSE:  Problem 1.  ACUTE RESPIRATORY DISTRESS:  The patient was  admitted to [facility] and was continued on pharmaceutical  intervention of IV antibiotics and IV steroids, along with nebulized  bronchodilators and supplemental oxygen.  She responded very well to  treatment and was oxygen-free with an O2 saturation of 93% by  day of  discharge on November 26, 2002.  Her antibiotics had been changed to p.o.  Tequin and her steroids had been changed to p.o. prednisone on a taper.  There was some speculation whether this was an acute viral syndrome as the  instigator of this acute respiratory distress, but she will be treated as  with a community-acquired pneumonia for safety.  She is being discharged  home in improved condition, to follow up with the pulmonary critical care  specialist at [facility] x1 and then go back to her primary care  physician, Dr. Milus Benson. Simpson.   DISCHARGE MEDICATIONS:  1. Tequin 400 mg a day for the next seven days.  2. Prednisone 20 mg tablets -- three tabs for three days, two days for two     days, one tab for two days, a half a tab for two days and then stop.   FOLLOWUP:  She has a followup  appointment with Dr. [name] Eke, November 26, 2002, at 1 p.m.   DIET:  Her diet is no concentrated sweets.    DISPOSITION/CONDITION ON DISCHARGE:  She is no longer hypoxic and her  pulmonary status has improved.  She is no longer requiring supplemental  oxygen.  She is being discharged home in improved condition.     Brett Canales Minor, A.C.N.P. [facility]                 Oley Balm. Sung Amabile, M.D. [facility]    SM/MEDQ  D:  11/26/2002  T:  11/26/2002  Job:  [id number]   cc:   [name]. [name], M.D.  [address]  [place]  [place] [address]  Fax: [phone]

## 2011-04-26 NOTE — Group Therapy Note (Signed)
   NAMESHAWNELL, DYKES                          ACCOUNT NO.:  [id number]   MEDICAL RECORD NO.:  [id number]                   PATIENT TYPE:  INP   LOCATION:                                       FACILITY:   PHYSICIAN:  Edward L. Juanetta Gosling, M.D.             DATE OF BIRTH:  [birth year]   DATE OF PROCEDURE:  DATE OF DISCHARGE:  11/23/2002                                   PROGRESS NOTE   PROBLEMS:  Pneumonia, bilateral stiffness.   SUBJECTIVE:  The patient says she is feeling fairly well.  However, her O2  saturation this morning was very low and she ended up having to be put on  50% O2 by mask.  Her chest x-ray this morning really shows no change.  I  have discussed her situation with Dr. Lodema Hong who is her primary care  physician.  Dr. Lodema Hong is very concerned that the patient may be developing  an ARDS picture which certainly is a possibility.  After discussion with Dr.  Lodema Hong I am going to go ahead and discuss with the intensivist team from  [facility] and see if they would be willing to accept the patient in transfer.  Otherwise, will hold the course.  Continue with antibiotics, oxygen, etc.   PHYSICAL EXAMINATION:  VITAL SIGNS:  Temperature 99, pulse 86, respirations  20.  They do not appear to be labored.  Blood pressure 114/66.   LABORATORIES:  Her I&O +350.  Her blood gas on 50% shows a pO2 of 67, pCO2  39, pH 7.42.  White count is 11,600 this morning.  Hemoglobin is 11.1.  Her  electrolytes with the exception of glucose is normal.   PLAN:  Continue treatments and follow.  Will discuss with the [facility]  Critical Care Team.                                               Oneal Deputy. Juanetta Gosling, M.D.    [facility]/MEDQ  D:  11/23/2002  T:  11/24/2002  Job:  [id number]

## 2011-04-26 NOTE — Discharge Summary (Signed)
[name], [name]                ACCOUNT NO.:  [id number]   MEDICAL RECORD NO.:  [id number]          PATIENT TYPE:  INP   LOCATION:  9144                          FACILITY:  [facility]   PHYSICIAN:  Maxie Better, M.D.DATE OF BIRTH:  03-26-1961   DATE OF ADMISSION:  01/17/2008  DATE OF DISCHARGE:  01/21/2008                               DISCHARGE SUMMARY   ADMISSION DIAGNOSES:  1. Term gestation.  2. Advanced maternal age.  3. Fibroid uterus.   DISCHARGE DIAGNOSES:  1. Term gestation, delivered.  2. Advance maternal age.  3. Arrested dilatation.  4. Fibroid uterus.  5. Non-reassuring fetal tracing.   PROCEDURE:  Primary cesarean section, total hysterectomy.   HISTORY OF PRESENT ILLNESS:  A 50 year old gravida 2, para 0-0-1-0  female at 39-6/7 weeks admitted for induction of labor secondary to  advanced maternal age.  The patient's history is notable for previous  twin gestation at the onset of this pregnancy with subsequently fetal  demise at the early stages of the pregnancy.  She had marked peripheral  edema.  Blood pressure is 159/84.  PIH labs on several occasions were  normal.   HOSPITAL COURSE:  The patient was admitted to cervix as 1, longer-manage-  3, to the estimated fetal weight of 6 pounds 10 ounces.  The patient  underwent Cervidil for ripening.  She has had a 24-urine collection  pending to determine whether or not she would need magnesium sulfate.  She had epidural placement, this progressed to 4 cm, 100%, -1, and the  artificial rupture of membranes was performed.  Clear fluid noted.  Internal scalp electrode and internal pressure catheter were both  placed.  Low-dose Pitocin was started.  The patient placed in  exaggerated Sims position.  She progressed to 5 cm, cervix became  edematous.  She has repetitive late decelerations and contractions every  2 minutes, not responsive to scalp.  Pitocin was discontinued.  Scalp  stimulation was done, accelerations  of Pitocin; however, no marked  change in her cervix in early that morning.  A decision was made to  proceed with a primary cesarean section for non-reassuring tracing and  arrested dilatation.  The patient had her surgery done, live female,  occiput posterior compound presentation, Apgars were 6 and 9, 6 pounds  11 ounces, posterior 6 cm subserosal fibroids and several anterior  pedunculated fibroids were noted.  Blood loss was about 1200 mL.  Postoperatively, the patient did well.  Though her blood pressures were  mildly elevated, her PIH labs remained stable.  She did not require  magnesium sulfate.  She, however, requested some assistance with her  marked edema and hydrochlorothiazide was given on postop day #2.  Her  CBC on postop day #1 showed a hemoglobin of 11, hematocrit of 32.8,  white count of 20.7, and platelet count 213,000.  On postop day #3, the  patient had no evidence of any infection.  She had 3-4+ edema.  Her  blood pressure was 142/86.  She was otherwise asymptomatic.  She had a  bowel movement, and she was ready for home.  DISPOSITION:  Home.   CONDITION:  Stable.   DISCHARGE FOLLOWUP:  Monday for blood pressure check and to recheck her  edema as well as her staple removal.   DISCHARGE INSTRUCTIONS:  Otherwise per the postpartum booklet as well as  for pregnancy-induced hypertension, preeclampsia warning signs.   DISCHARGE MEDICATIONS:  Motrin 600 mg every 8 hours p.r.n. pain,  Percocet 1-2 tablets every 4-6 hours p.r.n. pain, hydrochlorothiazide 25  mg p.o. daily, prenatal vitamins 1 p.o. daily.      Maxie Better, M.D.  Electronically Signed     [place]/MEDQ  D:  04/27/2008  T:  04/27/2008  Job:  [id number]

## 2011-04-26 NOTE — Discharge Summary (Signed)
NAMEKENLEA, [name]                ACCOUNT NO.:  [id number]   MEDICAL RECORD NO.:  [id number]          PATIENT TYPE:  INP   LOCATION:  9304                          FACILITY:  [facility]   PHYSICIAN:  Lendon Colonel, MD   DATE OF BIRTH:  [birth year]   DATE OF ADMISSION:  01/26/2008  DATE OF DISCHARGE:  01/28/2008                               DISCHARGE SUMMARY   CHIEF COMPLAINT:  Hypertension.   HISTORY OF PRESENT ILLNESS:  This is a 50 year old G2, P1, 8 days  postoperative from a primary C-section for arrest.  The patient was  noticed to have pregnancy-induced hypertension in the third trimester  with no evidence of preeclampsia immediately postpartum while the  patient was still in-house.  The patient had some mild increase in blood  pressure also with some increased swelling and was started on  hydrochlorothiazide.  The patient had a blood pressure check in the  office the day prior to readmission and was started on nifedipine 30 mg  XL daily.  The patient had had 1 dose of nifedipine and noted a headache  earlier today for which she checked her blood pressure at an automatic  blood pressure cuff at a pharmacy and noted her blood pressure was  200/108.  Before that, the patient came in and she reports no nausea and  vomiting, no right upper quadrant pain, no scotomata.  She does note  lower extremity edema, but no shortness of breath or chest pain.  The  patient is breast feeding.   On admission, her blood pressure was 181/99, repeat 196/106.  The  remainder of her past medical, surgical, and gynecological history is as  per her admission H and P.  Of note, her medicines include  hydrochlorothiazide 25 mg daily and Procardia XL 30 mg daily with Motrin  for pain.  Her exam was also remarkable for 4+ pitting edema, 3+ deep  tendon reflexes in the upper extremities and no lower extremity clonus.  Her incision was clean, dry, and intact and her fundus was firm and  below the umbilicus.   The patient was admitted for monitoring and  magnesium sulfate and blood pressure control.  On hospital day #2, the  patient noted some wooziness from her magnesium sulfate but no shortness  of breath, no chest pain, and was diuresing well.  Her weight had  decreased 1 pound from the day of admission.  Her preeclamptic labs were  within normal limits.  She now had 2+ DTRs and though she still had 3+  edema, her swellings in her lower extremities was less intense than day  of admission.  The patient was continued on labetalol 200 mg p.o. q.8  hours and nifedipine 30 XL daily.  Her magnesium was stopped at about 24  hours.  On hospital day #3, the patient was requesting to go home.  Her  lungs were clear.  Her exam was much improved and her blood pressures  were well controlled on labetalol and hydrochlorothiazide.  The patient  was discharged to home with followup in the office for blood pressure  check.   DISCHARGE INSTRUCTIONS:  Call with headaches, scotomata, increase in  pain, worsening blood pressure.  Followup in the office in 2 days for a  blood pressure check.   DISCHARGE DIAGNOSIS:  Postpartum pregnancy-induced hypertension.   DISCHARGE CONDITIONS:  Stable.   DISCHARGE DISPOSITION:  To home.      Lendon Colonel, MD  Electronically Signed     [facility]/MEDQ  D:  03/02/2008  T:  03/03/2008  Job:  [id number]

## 2011-06-04 ENCOUNTER — Encounter: Payer: Self-pay | Admitting: Family Medicine

## 2011-06-05 ENCOUNTER — Ambulatory Visit: Payer: BC Managed Care – PPO | Admitting: Family Medicine

## 2011-06-05 ENCOUNTER — Encounter: Payer: Self-pay | Admitting: Family Medicine

## 2011-06-10 ENCOUNTER — Other Ambulatory Visit: Payer: Self-pay | Admitting: Family Medicine

## 2011-06-10 DIAGNOSIS — Z139 Encounter for screening, unspecified: Secondary | ICD-10-CM

## 2011-06-17 ENCOUNTER — Ambulatory Visit (HOSPITAL_COMMUNITY)
Admission: RE | Admit: 2011-06-17 | Discharge: 2011-06-17 | Disposition: A | Discharge status: Home / Self Care | Payer: BC Managed Care – PPO | Source: Ambulatory Visit | Attending: Family Medicine | Admitting: Family Medicine

## 2011-06-17 DIAGNOSIS — Z1231 Encounter for screening mammogram for malignant neoplasm of breast: Secondary | ICD-10-CM | POA: Insufficient documentation

## 2011-06-17 DIAGNOSIS — Z139 Encounter for screening, unspecified: Secondary | ICD-10-CM

## 2011-08-30 LAB — COMPREHENSIVE METABOLIC PANEL
ALT: 29
ALT: 30
AST: 34
Albumin: 2.4 — ABNORMAL LOW
Albumin: 3.1 — ABNORMAL LOW
Alkaline Phosphatase: 152 — ABNORMAL HIGH
Alkaline Phosphatase: 84
Alkaline Phosphatase: 96
BUN: 7
BUN: 7
BUN: 9
CO2: 26
CO2: 28
Calcium: 8.8
Chloride: 107
Chloride: 102
Creatinine, Ser: 0.7
GFR calc Af Amer: >60
GFR calc non Af Amer: >60
GFR calc non Af Amer: >60
Glucose, Bld: 82
Glucose, Bld: 112 — ABNORMAL HIGH
Potassium: 3.8
Potassium: 3.4 — ABNORMAL LOW
Potassium: 3.4 — ABNORMAL LOW
Sodium: 138
Total Bilirubin: 0.4
Total Bilirubin: 0.5
Total Protein: 5.8 — ABNORMAL LOW
Total Protein: 6
Total Protein: 7.1

## 2011-08-30 LAB — CBC
HCT: 32.8 — ABNORMAL LOW
HCT: 35.5 — ABNORMAL LOW
HCT: 33.3 — ABNORMAL LOW
HCT: 38.1
Hemoglobin: 12
Hemoglobin: 11.4 — ABNORMAL LOW
MCHC: 33.7
MCHC: 33.8
MCV: 95
MCV: 96
Platelets: 213
Platelets: 412 — ABNORMAL HIGH
RBC: 3.52 — ABNORMAL LOW
RBC: 3.74 — ABNORMAL LOW
RDW: 13.5
RDW: 13.8
RDW: 13.4
RDW: 13.6
WBC: 8

## 2011-08-30 LAB — LACTATE DEHYDROGENASE
LDH: 173
LDH: 247

## 2011-08-30 LAB — URIC ACID
Uric Acid, Serum: 4.1
Uric Acid, Serum: 5.2

## 2011-08-30 LAB — MAGNESIUM: Magnesium: 5.6 — ABNORMAL HIGH

## 2011-08-30 LAB — RPR: RPR Ser Ql: NONREACTIVE

## 2011-09-06 ENCOUNTER — Telehealth: Payer: Self-pay | Admitting: Family Medicine

## 2011-09-06 NOTE — Telephone Encounter (Signed)
Patient aware.

## 2011-09-06 NOTE — Telephone Encounter (Signed)
Needs to be evaluated by an mD, no prescription will be sent in pls let her know she likely will need to go to urgent care

## 2011-09-09 ENCOUNTER — Ambulatory Visit (INDEPENDENT_AMBULATORY_CARE_PROVIDER_SITE_OTHER): Payer: BC Managed Care – PPO | Admitting: Family Medicine

## 2011-09-09 ENCOUNTER — Encounter: Payer: Self-pay | Admitting: Family Medicine

## 2011-09-09 VITALS — BP 134/80 | Temp 98.4°F | Ht 66.0 in | Wt 280.0 lb

## 2011-09-09 DIAGNOSIS — J019 Acute sinusitis, unspecified: Secondary | ICD-10-CM

## 2011-09-09 DIAGNOSIS — J040 Acute laryngitis: Secondary | ICD-10-CM

## 2011-09-09 MED ORDER — CEFTRIAXONE SODIUM 1 G IJ SOLR
500.0000 mg | Freq: Once | INTRAMUSCULAR | Status: AC
  Administered 2011-09-09: 500 mg via INTRAMUSCULAR

## 2011-09-09 MED ORDER — FLUTICASONE PROPIONATE 50 MCG/ACT NA SUSP: 1.0000 | Freq: Two times a day (BID) | NASAL | Status: DC

## 2011-09-09 MED ORDER — AMOXICILLIN-POT CLAVULANATE 875-125 MG PO TABS: 1.0000 | ORAL_TABLET | Freq: Two times a day (BID) | ORAL | Status: AC

## 2011-09-09 NOTE — Patient Instructions (Addendum)
Start the antibiotics tomorrow You have been given a shot of Rocephin  Use the flonase Plenty of fluids for your voice If you are not improved over the next week come back for a recheck, if you have high fever >101F that persist, difficulty breathing then call Schedule a follow-up with Dr. Lodema Hong for a physical exam  Get your flu shot

## 2011-09-09 NOTE — Progress Notes (Signed)
  Subjective:    Patient ID: [name], female    DOB: Apr 19, 1961, 50 y.o.   MRN: [id number]  HPI Nasal congestion and sinus pressure since last Monday, low grade fever this week, +post nasal drip, history of pneumonia, +chils,+fatigue, no body aches, toelarting food- appetite down   Review of Systems -per above     Objective:   Physical Exam GEN- NAD, alert and oriented x3 HEENT- PERRL, EOMI, non injected sclera, pink conjunctiva, MMM, oropharynx injected, TM clear bilat, enlarged turbinates, hoarse voice, rhinorrhea noted in nares, +maxillary sinus pressure Neck- Supple, no thryomegaly Nodes- no cervical or submandibular nodes CVS- RRR, no murmur RESP-CTAB EXT- No edema Pulses- Radial, DP- 2+        Assessment & Plan:

## 2011-09-11 NOTE — Assessment & Plan Note (Signed)
Plenty of fluids and voice rest, secondary to acute illness

## 2011-09-11 NOTE — Assessment & Plan Note (Signed)
Antibiotics and nasal spray

## 2011-11-15 ENCOUNTER — Other Ambulatory Visit: Payer: Self-pay | Admitting: Family Medicine

## 2011-11-18 ENCOUNTER — Encounter: Payer: Self-pay | Admitting: Family Medicine

## 2011-11-19 ENCOUNTER — Encounter: Payer: Self-pay | Admitting: Family Medicine

## 2011-11-19 ENCOUNTER — Ambulatory Visit (INDEPENDENT_AMBULATORY_CARE_PROVIDER_SITE_OTHER): Payer: BC Managed Care – PPO | Admitting: Family Medicine

## 2011-11-19 VITALS — BP 106/68 | HR 70 | Resp 18 | Ht 66.0 in | Wt 283.0 lb

## 2011-11-19 DIAGNOSIS — I10 Essential (primary) hypertension: Secondary | ICD-10-CM

## 2011-11-19 DIAGNOSIS — R5383 Other fatigue: Secondary | ICD-10-CM

## 2011-11-19 DIAGNOSIS — E669 Obesity, unspecified: Secondary | ICD-10-CM

## 2011-11-19 DIAGNOSIS — M899 Disorder of bone, unspecified: Secondary | ICD-10-CM

## 2011-11-19 DIAGNOSIS — J209 Acute bronchitis, unspecified: Secondary | ICD-10-CM

## 2011-11-19 DIAGNOSIS — J019 Acute sinusitis, unspecified: Secondary | ICD-10-CM

## 2011-11-19 DIAGNOSIS — R5381 Other malaise: Secondary | ICD-10-CM

## 2011-11-19 DIAGNOSIS — R7301 Impaired fasting glucose: Secondary | ICD-10-CM

## 2011-11-19 MED ORDER — BENZONATATE 100 MG PO CAPS: 100.0000 mg | ORAL_CAPSULE | Freq: Four times a day (QID) | ORAL | Status: DC | PRN

## 2011-11-19 MED ORDER — CEFTRIAXONE SODIUM 500 MG IJ SOLR
500.0000 mg | Freq: Once | INTRAMUSCULAR | Status: AC
  Administered 2011-11-19: 500 mg via INTRAMUSCULAR

## 2011-11-19 MED ORDER — METHYLPREDNISOLONE ACETATE PF 80 MG/ML IJ SUSP
80.0000 mg | Freq: Once | INTRAMUSCULAR | Status: AC
  Administered 2011-11-19: 80 mg via INTRAMUSCULAR

## 2011-11-19 MED ORDER — PHENTERMINE HCL 37.5 MG PO TABS: 37.5000 mg | ORAL_TABLET | Freq: Every day | ORAL | Status: DC

## 2011-11-19 MED ORDER — FLUCONAZOLE 150 MG PO TABS: ORAL_TABLET | ORAL | Status: DC

## 2011-11-19 MED ORDER — PREDNISONE (PAK) 5 MG PO TABS: 5.0000 mg | ORAL_TABLET | ORAL | Status: DC

## 2011-11-19 MED ORDER — PENICILLIN V POTASSIUM 500 MG PO TABS: 500.0000 mg | ORAL_TABLET | Freq: Three times a day (TID) | ORAL | Status: AC

## 2011-11-19 NOTE — Patient Instructions (Addendum)
F/u in 2 months.  You will get depomedrol 80mg  Im in the office for allergy symptoms, and Rocephin 500mg  im for bronchitis. Penicillin, tessalon perles, prednisone and fluconazole are prescribed.  Pls log in and use "My fitness pal": ,to help with control of calories.  We will provide sample 1500 cal diet sheet  CBC, hBA1C, chem 7 , lipids, TSH vit D fasting in 2 month  Weight loss goal of 4 to 6 pounds per month

## 2011-11-19 NOTE — Progress Notes (Signed)
  Subjective:    Patient ID: [name], female    DOB: [birth year], 50 y.o.   MRN: [id number]  HPI 1 week h/o increased sinus pressure and congestion, chest congestion with productive cough with yellow sputum. 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 No progress with weight loss, wants to resume phentermine, needs to commit to exercise and change in eating habits also, counselled for approx 10 mins again!   Review of Systems See HPI  Denies chest pains, palpitations and leg swelling Denies abdominal pain, nausea, vomiting,diarrhea or constipation.   Denies dysuria, frequency, hesitancy or incontinence. Denies joint pain, swelling and limitation in mobility. Denies headaches, seizures, numbness, or tingling. Denies depression, anxiety or insomnia. Denies skin break down or rash.        Objective:   Physical Exam  Patient alert and oriented and in no cardiopulmonary distress.  HEENT: No facial asymmetry, EOMI, maxillary sinus tenderness,  oropharynx pink and moist.  Neck supple no adenopathy.  Chest: Clear to auscultation decreased air entry scattered crackles, no wheezes.  CVS: S1, S2 no murmurs, no S3.  ABD: Soft non tender. Bowel sounds normal.  Ext: No edema  MS: Adequate ROM spine, shoulders, hips and knees.  Skin: Intact, no ulcerations or rash noted.  Psych: Good eye contact, normal affect. Memory intact not anxious or depressed appearing.  CNS: CN 2-12 intact, power, tone and sensation normal throughout.       Assessment & Plan:

## 2011-11-20 ENCOUNTER — Other Ambulatory Visit: Payer: Self-pay | Admitting: Family Medicine

## 2011-11-20 ENCOUNTER — Telehealth: Payer: Self-pay | Admitting: Family Medicine

## 2011-11-20 DIAGNOSIS — E669 Obesity, unspecified: Secondary | ICD-10-CM

## 2011-11-20 MED ORDER — PHENTERMINE HCL 37.5 MG PO TABS: 37.5000 mg | ORAL_TABLET | Freq: Every day | ORAL | Status: DC

## 2011-11-20 NOTE — Assessment & Plan Note (Signed)
Deteriorated. Patient re-educated about  the importance of commitment to a  minimum of 150 minutes of exercise per week. The importance of healthy food choices with portion control discussed. Encouraged to start a food diary, count calories and to consider  joining a support group. Sample diet sheets offered. Goals set by the patient for the next several months.    

## 2011-11-20 NOTE — Telephone Encounter (Signed)
Resent

## 2011-11-20 NOTE — Assessment & Plan Note (Signed)
Antibiotic prescribed and prednisone and depomedrol for allergy symptoms

## 2011-11-20 NOTE — Assessment & Plan Note (Signed)
Acute episode, antibiotic administered in office as well as prescribed

## 2012-01-20 ENCOUNTER — Ambulatory Visit: Payer: BC Managed Care – PPO | Admitting: Family Medicine

## 2012-01-27 ENCOUNTER — Other Ambulatory Visit: Payer: Self-pay | Admitting: Family Medicine

## 2012-01-31 ENCOUNTER — Telehealth: Payer: Self-pay | Admitting: Family Medicine

## 2012-01-31 NOTE — Telephone Encounter (Signed)
I called [facility]ice pharmacy and they told me at [facility] that they have had Janice labetolol for 4 days. I called [name] back and made her aware of what they told me and that I was sorry and I didn't know why they have been telling her all week that they didn't have it

## 2012-03-02 ENCOUNTER — Other Ambulatory Visit: Payer: Self-pay | Admitting: Family Medicine

## 2012-04-09 ENCOUNTER — Ambulatory Visit: Payer: BC Managed Care – PPO | Admitting: Family Medicine

## 2012-04-20 ENCOUNTER — Ambulatory Visit: Payer: BC Managed Care – PPO | Admitting: Family Medicine

## 2012-05-11 ENCOUNTER — Ambulatory Visit: Payer: BC Managed Care – PPO | Admitting: Family Medicine

## 2012-05-18 ENCOUNTER — Encounter: Payer: Self-pay | Admitting: Family Medicine

## 2012-05-18 ENCOUNTER — Ambulatory Visit (INDEPENDENT_AMBULATORY_CARE_PROVIDER_SITE_OTHER): Payer: BC Managed Care – PPO | Admitting: Family Medicine

## 2012-05-18 VITALS — BP 120/72 | HR 75 | Resp 18 | Ht 66.0 in | Wt 277.0 lb

## 2012-05-18 DIAGNOSIS — L259 Unspecified contact dermatitis, unspecified cause: Secondary | ICD-10-CM

## 2012-05-18 DIAGNOSIS — R0789 Other chest pain: Secondary | ICD-10-CM

## 2012-05-18 DIAGNOSIS — L309 Dermatitis, unspecified: Secondary | ICD-10-CM

## 2012-05-18 DIAGNOSIS — I10 Essential (primary) hypertension: Secondary | ICD-10-CM

## 2012-05-18 DIAGNOSIS — Z23 Encounter for immunization: Secondary | ICD-10-CM

## 2012-05-18 DIAGNOSIS — K219 Gastro-esophageal reflux disease without esophagitis: Secondary | ICD-10-CM

## 2012-05-18 DIAGNOSIS — J309 Allergic rhinitis, unspecified: Secondary | ICD-10-CM

## 2012-05-18 DIAGNOSIS — M62838 Other muscle spasm: Secondary | ICD-10-CM | POA: Insufficient documentation

## 2012-05-18 DIAGNOSIS — E669 Obesity, unspecified: Secondary | ICD-10-CM

## 2012-05-18 MED ORDER — CYCLOBENZAPRINE HCL 10 MG PO TABS: ORAL_TABLET | ORAL | Status: DC

## 2012-05-18 MED ORDER — FLUTICASONE PROPIONATE 50 MCG/ACT NA SUSP: 1.0000 | Freq: Two times a day (BID) | NASAL | Status: DC

## 2012-05-18 MED ORDER — BETAMETHASONE VALERATE 0.1 % EX OINT: TOPICAL_OINTMENT | Freq: Two times a day (BID) | CUTANEOUS | Status: AC

## 2012-05-18 NOTE — Patient Instructions (Addendum)
F/u in 4.5 month  Congrats on 6 pound weight loss.  Goal of 2 to 4 pounds per month of weight loss  Please commit to 30 minutes of exercise at least 5 days per week  Please try and restrict calories to 1500 calories daily.  Substituting 1 to 2 meals per day with low calorie counted entrees eg Lean cuisine, healthy choice or smart ones helps to keep a count on your calories  Cream is prescribed for the dark areas on your hands, use sparingly twice daily for 1 week then as needed   Call if you remain concerned for referral to a dermatologist.  Muscle relaxant is prescribed  For neck spasm   EKG is normal  No changes in current medication

## 2012-05-18 NOTE — Progress Notes (Signed)
  Subjective:    Patient ID: [name], female    DOB: Aug 08, 1961, 51 y.o.   MRN: [id number]  HPI The PT is here for follow up and re-evaluation of chronic medical conditions, medication management and review of any available recent lab and radiology data.  Preventive health is updated, specifically  Cancer screening and Immunization.   Questions or concerns regarding consultations or procedures which the PT has had in the interim are  addressed. The PT denies any adverse reactions to current medications since the last visit.  1 year h/o dark rough spots at bases of both index fingers. 2 dark areas on left upper arm x 3 month  5 day h/o numbness, warmth and tightness in left shoulder, under excessive stress, poor sleep . Concerned about the possibility of occult heart disease, has intermittent chest discomfort, not related to activity, position or food, no associated nausea , light headedness or diaphoresis. Has worked on altering her diet with modest weight loss success, intends to start walking regulalrly     Review of Systems See HPI Denies recent fever or chills. Denies sinus pressure, nasal congestion, ear pain or sore throat.Does have increased nasal drainage which is clear and increased sneezing, both expected at this season Denies chest congestion, productive cough or wheezing. Denies  palpitations and leg swelling Denies abdominal pain, nausea, vomiting,diarrhea or constipation.   Denies dysuria, frequency, hesitancy or incontinence.  Denies headaches, seizures, numbness, or tingling. Denies depression, anxiety or insomnia.      Objective:   Physical Exam Patient alert and oriented and in no cardiopulmonary distress.  HEENT: No facial asymmetry, EOMI, no sinus tenderness,  oropharynx pink and moist.  Neck decreased ROM left neck with spasm no adenopathy.erythema and edema of nasal mucosa  Chest: Clear to auscultation bilaterally.No chest wall tenderness  CVS: S1, S2  no murmurs, no S3. EKG: Normal sinus rythm, no cardiomegaly, no ischemia  ABD: Soft non tender. Bowel sounds normal.  Ext: No edema  MS: Adequate ROM spine, shoulders, hips and knees.  Skin: Intact,hyperpigmentation in areas of hands, skin appears dry and rough Psych: Good eye contact, normal affect. Memory intact not anxious or depressed appearing.  CNS: CN 2-12 intact, power, tone and sensation normal throughout.        Assessment & Plan:

## 2012-05-28 DIAGNOSIS — R0789 Other chest pain: Secondary | ICD-10-CM | POA: Insufficient documentation

## 2012-05-28 DIAGNOSIS — I10 Essential (primary) hypertension: Secondary | ICD-10-CM | POA: Insufficient documentation

## 2012-05-28 NOTE — Assessment & Plan Note (Signed)
Intermittent neck spasm, muscle relaxant to be used as needed

## 2012-05-28 NOTE — Assessment & Plan Note (Signed)
Increased symptoms during this season, nasal spray to be refilled

## 2012-05-28 NOTE — Assessment & Plan Note (Signed)
Improved. Pt applauded on succesful weight loss through lifestyle change, and encouraged to continue same. Weight loss goal set for the next several months.  

## 2012-05-28 NOTE — Assessment & Plan Note (Signed)
Office EKG is normal pt reassured no evidence of CV disease, she will work on behavior modification aggressively

## 2012-05-28 NOTE — Assessment & Plan Note (Signed)
Steroid cream to be applied sparingly to hyperpigmented areas as needed

## 2012-05-28 NOTE — Assessment & Plan Note (Signed)
Controlled, no change in medication  

## 2012-05-28 NOTE — Assessment & Plan Note (Signed)
Asymptomatic, requiring no chronic medication at this time

## 2012-06-08 ENCOUNTER — Encounter: Payer: Self-pay | Admitting: Family Medicine

## 2012-06-20 ENCOUNTER — Other Ambulatory Visit: Payer: Self-pay | Admitting: Family Medicine

## 2012-06-22 ENCOUNTER — Other Ambulatory Visit: Payer: Self-pay

## 2012-06-22 MED ORDER — LABETALOL HCL 200 MG PO TABS: 200.0000 mg | ORAL_TABLET | Freq: Every day | ORAL | Status: DC

## 2012-06-23 ENCOUNTER — Other Ambulatory Visit: Payer: Self-pay | Admitting: Family Medicine

## 2012-06-23 DIAGNOSIS — Z139 Encounter for screening, unspecified: Secondary | ICD-10-CM

## 2012-06-30 ENCOUNTER — Ambulatory Visit (HOSPITAL_COMMUNITY)
Admission: RE | Admit: 2012-06-30 | Discharge: 2012-06-30 | Disposition: A | Discharge status: Home / Self Care | Payer: BC Managed Care – PPO | Source: Ambulatory Visit | Attending: Family Medicine | Admitting: Family Medicine

## 2012-06-30 DIAGNOSIS — Z139 Encounter for screening, unspecified: Secondary | ICD-10-CM

## 2012-06-30 DIAGNOSIS — Z1231 Encounter for screening mammogram for malignant neoplasm of breast: Secondary | ICD-10-CM | POA: Insufficient documentation

## 2012-10-26 ENCOUNTER — Ambulatory Visit (INDEPENDENT_AMBULATORY_CARE_PROVIDER_SITE_OTHER): Payer: BC Managed Care – PPO | Admitting: Family Medicine

## 2012-10-26 ENCOUNTER — Encounter: Payer: Self-pay | Admitting: Family Medicine

## 2012-10-26 VITALS — BP 130/80 | HR 73 | Temp 99.2°F | Resp 15 | Ht 66.0 in | Wt 287.0 lb

## 2012-10-26 DIAGNOSIS — E669 Obesity, unspecified: Secondary | ICD-10-CM

## 2012-10-26 DIAGNOSIS — R5381 Other malaise: Secondary | ICD-10-CM

## 2012-10-26 DIAGNOSIS — R5383 Other fatigue: Secondary | ICD-10-CM

## 2012-10-26 DIAGNOSIS — R7301 Impaired fasting glucose: Secondary | ICD-10-CM

## 2012-10-26 DIAGNOSIS — I10 Essential (primary) hypertension: Secondary | ICD-10-CM

## 2012-10-26 DIAGNOSIS — J309 Allergic rhinitis, unspecified: Secondary | ICD-10-CM

## 2012-10-26 MED ORDER — PREDNISONE (PAK) 5 MG PO TABS: 5.0000 mg | ORAL_TABLET | ORAL | Status: DC

## 2012-10-26 MED ORDER — AZITHROMYCIN 250 MG PO TABS: ORAL_TABLET | ORAL | Status: AC

## 2012-10-26 MED ORDER — FLUTICASONE PROPIONATE 50 MCG/ACT NA SUSP: 1.0000 | Freq: Two times a day (BID) | NASAL | Status: DC

## 2012-10-26 MED ORDER — FLUCONAZOLE 150 MG PO TABS: ORAL_TABLET | ORAL | Status: AC

## 2012-10-26 NOTE — Assessment & Plan Note (Signed)
Controlled, no change in medication DASH diet and commitment to daily physical activity for a minimum of 30 minutes discussed and encouraged, as a part of hypertension management. The importance of attaining a healthy weight is also discussed.  

## 2012-10-26 NOTE — Assessment & Plan Note (Addendum)
Uncontrolled, and nasal congestion aggravated by excessive use of decongestant  Depo medrol and prednisone, also saline sprays flonase refilled also

## 2012-10-26 NOTE — Assessment & Plan Note (Signed)
Deteriorated. Patient re-educated about  the importance of commitment to a  minimum of 150 minutes of exercise per week. The importance of healthy food choices with portion control discussed. Encouraged to start a food diary, count calories and to consider  joining a support group. Sample diet sheets offered. Goals set by the patient for the next several months.    

## 2012-10-26 NOTE — Progress Notes (Signed)
  Subjective:    Patient ID: [name], female    DOB: 1961/02/15, 51 y.o.   MRN: [id number]  HPI 2 week h/o head congestion , nasal pressure and swelling, has been using oTC decongestant nasal spray continually. Has had some chills, denies frank nasal drainage, sore throat  Or productive cough. Concerned about ongoing weightgain and radily admits that hse is inconsistent in lifestyle changes to facilitate weight loss.    Review of Systems See HPI  Denies chest pains, palpitations and leg swelling Denies abdominal pain, nausea, vomiting,diarrhea or constipation.   Denies dysuria, frequency, hesitancy or incontinence. Denies joint pain, swelling and limitation in mobility. Denies headaches, seizures, numbness, or tingling. Denies depression, anxiety or insomnia. Denies skin break down or rash.        Objective:   Physical Exam Patient alert and oriented and in no cardiopulmonary distress.  HEENT: No facial asymmetry, EOMI, mild frontal sinus tenderness,  oropharynx pink and moist.  Neck supple no adenopathy.Nasal mucosa erythematous  And edematous  Chest: Clear to auscultation bilaterally.  CVS: S1, S2 no murmurs, no S3.  ABD: Soft non tender. Bowel sounds normal.  Ext: No edema  MS: Adequate ROM spine, shoulders, hips and knees.  Skin: Intact, no ulcerations or rash noted.  Psych: Good eye contact, normal affect. Memory intact not anxious or depressed appearing.  CNS: CN 2-12 intact, power, tone and sensation normal throughout.        Assessment & Plan:

## 2012-10-26 NOTE — Assessment & Plan Note (Signed)
Patient educated about the importance of limiting  Carbohydrate intake , the need to commit to daily physical activity for a minimum of 30 minutes , and to commit weight loss. The fact that changes in all these areas will reduce or eliminate all together the development of diabetes is stressed.   Updated lab needed 

## 2012-10-26 NOTE — Patient Instructions (Addendum)
F/u in end February.Call if you need me before please  Weight loss goal of 4 pounds per month  Fasting cbc, HBA1C, lipid, TSH end December  It is important that you exercise regularly at least 30 minutes 5 times a week. If you develop chest pain, have severe difficulty breathing, or feel very tired, stop exercising immediately and seek medical attention   A healthy diet is rich in fruit, vegetables and whole grains. Poultry fish, nuts and beans are a healthy choice for protein rather then red meat. A low sodium diet and drinking 64 ounces of water daily is generally recommended. Oils and sweet should be limited. Carbohydrates especially for those who are diabetic or overweight, should be limited to 30-45 gram per meal. It is important to eat on a regular schedule, at least 3 times daily. Snacks should be primarily fruits, vegetables or nuts.  Please stop OTC nasal spray, this should be used for no more than 3 days in a stretch, and this is why your nostril is remaining swollen Depo medrol an prednisone today.Use flonase every day, we are refilling, also netty pot with normal saline rinse is fine  You need to contact the nutritionist for appointment, and we will provide info for you to look into bariatric surgery in [place], we do not refer, you go to the first session which is free on your own  Blood pressure is excellent  Keep script for z pack and fluconazole in the event that you worsen, develop fever, chills and green drainge in the next 1 to 2 weeks. Call with questions or concerns

## 2012-10-27 MED ORDER — METHYLPREDNISOLONE ACETATE 80 MG/ML IJ SUSP
80.0000 mg | Freq: Once | INTRAMUSCULAR | Status: AC
  Administered 2012-10-27: 80 mg via INTRAMUSCULAR

## 2012-10-27 NOTE — Addendum Note (Signed)
Addended by: Abner Greenspan on: 10/27/2012 08:00 AM   Modules accepted: Orders

## 2012-11-25 ENCOUNTER — Ambulatory Visit (INDEPENDENT_AMBULATORY_CARE_PROVIDER_SITE_OTHER): Payer: BC Managed Care – PPO | Admitting: Family Medicine

## 2012-11-25 ENCOUNTER — Encounter: Payer: Self-pay | Admitting: Family Medicine

## 2012-11-25 VITALS — BP 130/84 | HR 67 | Resp 16 | Ht 66.0 in | Wt 286.4 lb

## 2012-11-25 DIAGNOSIS — R7301 Impaired fasting glucose: Secondary | ICD-10-CM

## 2012-11-25 DIAGNOSIS — I10 Essential (primary) hypertension: Secondary | ICD-10-CM

## 2012-11-25 DIAGNOSIS — R0789 Other chest pain: Secondary | ICD-10-CM

## 2012-11-25 DIAGNOSIS — M779 Enthesopathy, unspecified: Secondary | ICD-10-CM

## 2012-11-25 DIAGNOSIS — E669 Obesity, unspecified: Secondary | ICD-10-CM

## 2012-11-25 MED ORDER — METHYLPREDNISOLONE ACETATE 80 MG/ML IJ SUSP
80.0000 mg | Freq: Once | INTRAMUSCULAR | Status: AC
  Administered 2012-11-25: 80 mg via INTRAMUSCULAR

## 2012-11-25 MED ORDER — KETOROLAC TROMETHAMINE 60 MG/2ML IM SOLN
60.0000 mg | Freq: Once | INTRAMUSCULAR | Status: AC
  Administered 2012-11-25: 60 mg via INTRAMUSCULAR

## 2012-11-25 MED ORDER — IBUPROFEN 800 MG PO TABS: 800.0000 mg | ORAL_TABLET | Freq: Three times a day (TID) | ORAL | Status: DC | PRN

## 2012-11-25 NOTE — Patient Instructions (Addendum)
F/u as before. Your EKG is normal, no sign of heart damage  Pain in upper chest and back aggravated by upper body movement is from tendonitis, inflammation of tendons. Please limit upper extremity movement as well as stretching and weigtht lifting for the next 2 to 3 weeks  Toradol 60mg  and depo medrol 80mg  IM administered in office and ibuprofen sent to your pharmacy, please take entire course

## 2012-11-25 NOTE — Assessment & Plan Note (Signed)
Office EKG NSR, no ischemia

## 2012-11-25 NOTE — Progress Notes (Signed)
  Subjective:    Patient ID: [name], female    DOB: [birth year], 51 y.o.   MRN: [id number]  HPI C/o 1 week h/o right shoulder pain radiating to shoulder blade, aggravated by upper body movement. No direct trauma to area in recent times. Also c/o intermittent left chest discomfort x 1 week, not specifically related to activity, non radiating , and with no associated nausea, light headedness and diaphoresis, she is concerned about the possibility of heart disease. No consistent effort at weight loss as of yet , but still comited mentally to the cause. Denies cough, or chest pain with breathing, no recent chills or fever    Review of Systems See HPI Denies recent fever or chills. Denies sinus pressure, nasal congestion, ear pain or sore throat. Denies chest congestion, productive cough or wheezing. Denies pND, orthopnea, palpitations and leg swelling Denies abdominal pain, nausea, vomiting,diarrhea or constipation.   Denies dysuria, frequency, hesitancy or incontinence.  Denies headaches, seizures, numbness, or tingling. Denies depression, anxiety or insomnia. Denies skin break down or rash.        Objective:   Physical Exam Patient alert and oriented and in no cardiopulmonary distress.  HEENT: No facial asymmetry, EOMI, no sinus tenderness,  oropharynx pink and moist.  Neck supple no adenopathy.  Chest: Clear to auscultation bilaterally.No reproducible chest wall tenderness  CVS: S1, S2 no murmurs, no S3. EKG; NSR, no ischemia ABD: Soft non tender. Bowel sounds normal.  Ext: No edema  MS: Adequate ROM spine,  hips and knees.Decreased ROM right shoulder  Skin: Intact, no ulcerations or rash noted.  Psych: Good eye contact, normal affect. Memory intact not anxious or depressed appearing.  CNS: CN 2-12 intact, power, tone and sensation normal throughout.        Assessment & Plan:

## 2012-12-11 ENCOUNTER — Encounter: Payer: Self-pay | Admitting: Family Medicine

## 2012-12-13 NOTE — Assessment & Plan Note (Signed)
Patient educated about the importance of limiting  Carbohydrate intake , the need to commit to daily physical activity for a minimum of 30 minutes , and to commit weight loss. The fact that changes in all these areas will reduce or eliminate all together the development of diabetes is stressed.    

## 2012-12-13 NOTE — Assessment & Plan Note (Signed)
Controlled, no change in medication DASH diet and commitment to daily physical activity for a minimum of 30 minutes discussed and encouraged, as a part of hypertension management. The importance of attaining a healthy weight is also discussed.  

## 2012-12-13 NOTE — Assessment & Plan Note (Signed)
Anti inflammatories administered and prescribed

## 2012-12-13 NOTE — Assessment & Plan Note (Signed)
Deteriorated. Patient re-educated about  the importance of commitment to a  minimum of 150 minutes of exercise per week. The importance of healthy food choices with portion control discussed. Encouraged to start a food diary, count calories and to consider  joining a support group. Sample diet sheets offered. Goals set by the patient for the next several months.    

## 2013-02-04 ENCOUNTER — Other Ambulatory Visit: Payer: Self-pay | Admitting: Family Medicine

## 2013-02-04 ENCOUNTER — Ambulatory Visit (INDEPENDENT_AMBULATORY_CARE_PROVIDER_SITE_OTHER): Payer: BC Managed Care – PPO | Admitting: Family Medicine

## 2013-02-04 ENCOUNTER — Encounter: Payer: Self-pay | Admitting: Family Medicine

## 2013-02-04 VITALS — BP 118/80 | HR 85 | Resp 16 | Ht 66.0 in | Wt 291.0 lb

## 2013-02-04 DIAGNOSIS — I10 Essential (primary) hypertension: Secondary | ICD-10-CM

## 2013-02-04 DIAGNOSIS — R7301 Impaired fasting glucose: Secondary | ICD-10-CM

## 2013-02-04 DIAGNOSIS — R5381 Other malaise: Secondary | ICD-10-CM

## 2013-02-04 DIAGNOSIS — E669 Obesity, unspecified: Secondary | ICD-10-CM

## 2013-02-04 DIAGNOSIS — L02429 Furuncle of limb, unspecified: Secondary | ICD-10-CM | POA: Insufficient documentation

## 2013-02-04 DIAGNOSIS — L02439 Carbuncle of limb, unspecified: Secondary | ICD-10-CM

## 2013-02-04 DIAGNOSIS — E559 Vitamin D deficiency, unspecified: Secondary | ICD-10-CM

## 2013-02-04 MED ORDER — FLUCONAZOLE 150 MG PO TABS: ORAL_TABLET | ORAL | Status: AC

## 2013-02-04 MED ORDER — DOXYCYCLINE HYCLATE 50 MG PO CAPS: 50.0000 mg | ORAL_CAPSULE | Freq: Two times a day (BID) | ORAL | Status: DC

## 2013-02-04 NOTE — Progress Notes (Signed)
  Subjective:    Patient ID: [name], female    DOB: [birth year], 52 y.o.   MRN: [id number]  HPI 4 day h/o painful right axillary cyst, increasing in size, no drainage as yet, no fevr or chills   Review of Systems See HPI Denies recent fever or chills. Denies sinus pressure, nasal congestion, ear pain or sore throat. Denies chest congestion, productive cough or wheezing. Currently grieving recent unexpected loss of her Mom, hadling this well       Objective:   Physical Exam Patient alert and oriented and in no cardiopulmonary distress.  HEENT: No facial asymmetry, EOMI, no sinus tenderness,  oropharynx pink and moist.  Neck supple no adenopathy.  Chest: Clear to auscultation bilaterally.  CVS: S1, S2 no murmurs, no S3.  ABD: Soft non tender. Bowel sounds normal.  Ext: No edema  Skin: infected right axillary cyst, tender , red, warm, diameter max approx 4cm. Unable to extrude pus with pressure.        Assessment & Plan:

## 2013-02-04 NOTE — Patient Instructions (Addendum)
F/u in 4 month, OK to cancel sooner appointment  Please accept my condolence on the passing of  Your mother  Antibiotic and fluconazole are prescribed for the cyst under your right arm.  Call for surgical referral nnext Monday if still a problem.    Please do not shave under your arm, this increases the occurrence of this problem  Labs today, cbc, chem 7, hBa1C, tSH and Vit D

## 2013-02-05 LAB — CBC WITH DIFFERENTIAL/PLATELET
Eosinophils Absolute: 0.2 10*3/uL (ref 0.0–0.7)
Eosinophils Relative: 3 % (ref 0–5)
Hemoglobin: 11.6 g/dL — ABNORMAL LOW (ref 12.0–15.0)
Lymphocytes Relative: 34 % (ref 12–46)
Lymphs Abs: 1.9 10*3/uL (ref 0.7–4.0)
MCH: 29.7 pg (ref 26.0–34.0)
MCV: 88.5 fL (ref 78.0–100.0)
Monocytes Relative: 10 % (ref 3–12)
Neutrophils Relative %: 53 % (ref 43–77)
RBC: 3.9 MIL/uL (ref 3.87–5.11)
WBC: 5.7 10*3/uL (ref 4.0–10.5)

## 2013-02-05 LAB — VITAMIN D 25 HYDROXY (VIT D DEFICIENCY, FRACTURES): Vit D, 25-Hydroxy: 27 ng/mL — ABNORMAL LOW (ref 30–89)

## 2013-02-05 LAB — BASIC METABOLIC PANEL
CO2: 29 mEq/L (ref 19–32)
Calcium: 9.7 mg/dL (ref 8.4–10.5)
Chloride: 105 mEq/L (ref 96–112)
Glucose, Bld: 85 mg/dL (ref 70–99)
Sodium: 142 mEq/L (ref 135–145)

## 2013-02-07 NOTE — Assessment & Plan Note (Addendum)
Updated hBa1C needed Patient educated about the importance of limiting  Carbohydrate intake , the need to commit to daily physical activity for a minimum of 30 minutes , and to commit weight loss. The fact that changes in all these areas will reduce or eliminate all together the development of diabetes is stressed.

## 2013-02-07 NOTE — Assessment & Plan Note (Signed)
Controlled, no change in medication DASH diet and commitment to daily physical activity for a minimum of 30 minutes discussed and encouraged, as a part of hypertension management. The importance of attaining a healthy weight is also discussed.  

## 2013-02-07 NOTE — Assessment & Plan Note (Signed)
Deteriorated. Patient re-educated about  the importance of commitment to a  minimum of 150 minutes of exercise per week. The importance of healthy food choices with portion control discussed. Encouraged to start a food diary, count calories and to consider  joining a support group. Sample diet sheets offered. Goals set by the patient for the next several months.    

## 2013-02-07 NOTE — Assessment & Plan Note (Signed)
Acute infected cyst right axilla. Advised pt to not shave. Antibiotic course prescribed, she ios to call for surgical referral if worsens

## 2013-02-23 ENCOUNTER — Ambulatory Visit: Payer: BC Managed Care – PPO | Admitting: Family Medicine

## 2013-05-24 ENCOUNTER — Other Ambulatory Visit: Payer: Self-pay | Admitting: Family Medicine

## 2013-05-28 ENCOUNTER — Telehealth: Payer: Self-pay | Admitting: Family Medicine

## 2013-05-28 NOTE — Telephone Encounter (Signed)
pls let pt know that she is strongly encouraged to make appt to follow her chroinic health needs. I recently was aleed by her insurance company also, she has cancelled several appts Pls sched appt if she is willing to for July or August

## 2013-06-01 NOTE — Telephone Encounter (Signed)
Called and spoke with pt.  She is now scheduled for 7/14 @ 3:30 states that she may need to call back and change the date due to vacation.  Plans to call back on 6/25

## 2013-06-22 ENCOUNTER — Encounter: Payer: Self-pay | Admitting: Family Medicine

## 2013-06-22 ENCOUNTER — Ambulatory Visit: Payer: BC Managed Care – PPO | Admitting: Family Medicine

## 2013-06-28 ENCOUNTER — Ambulatory Visit (INDEPENDENT_AMBULATORY_CARE_PROVIDER_SITE_OTHER): Payer: BC Managed Care – PPO | Admitting: Family Medicine

## 2013-06-28 ENCOUNTER — Encounter: Payer: Self-pay | Admitting: Family Medicine

## 2013-06-28 VITALS — BP 120/74 | HR 74 | Resp 18 | Ht 66.0 in | Wt 283.0 lb

## 2013-06-28 DIAGNOSIS — J029 Acute pharyngitis, unspecified: Secondary | ICD-10-CM

## 2013-06-28 DIAGNOSIS — Z1211 Encounter for screening for malignant neoplasm of colon: Secondary | ICD-10-CM

## 2013-06-28 DIAGNOSIS — E8881 Metabolic syndrome: Secondary | ICD-10-CM

## 2013-06-28 DIAGNOSIS — E669 Obesity, unspecified: Secondary | ICD-10-CM | POA: Insufficient documentation

## 2013-06-28 DIAGNOSIS — R7301 Impaired fasting glucose: Secondary | ICD-10-CM

## 2013-06-28 DIAGNOSIS — E785 Hyperlipidemia, unspecified: Secondary | ICD-10-CM

## 2013-06-28 DIAGNOSIS — R5381 Other malaise: Secondary | ICD-10-CM

## 2013-06-28 DIAGNOSIS — I10 Essential (primary) hypertension: Secondary | ICD-10-CM

## 2013-06-28 LAB — POCT RAPID STREP A (OFFICE): Rapid Strep A Screen: NEGATIVE

## 2013-06-28 MED ORDER — PHENTERMINE HCL 37.5 MG PO TABS: 37.5000 mg | ORAL_TABLET | Freq: Every day | ORAL | Status: DC

## 2013-06-28 MED ORDER — FLUCONAZOLE 150 MG PO TABS: ORAL_TABLET | ORAL | Status: AC

## 2013-06-28 MED ORDER — PENICILLIN V POTASSIUM 500 MG PO TABS: 500.0000 mg | ORAL_TABLET | Freq: Three times a day (TID) | ORAL | Status: DC

## 2013-06-28 MED ORDER — CEFTRIAXONE SODIUM 500 MG IJ SOLR
500.0000 mg | Freq: Once | INTRAMUSCULAR | Status: AC
  Administered 2013-06-28: 500 mg via INTRAMUSCULAR

## 2013-06-28 NOTE — Patient Instructions (Addendum)
F/U in 4 month  You are being treated for acute pharyngitis, you do have pus on your right tonsil  Avoid close contact with people for 24 hours.  Rocephin 500mg  given in office and penicillin and fluconazole sent to your pharmacy  New is phentermine HALF daily, weight loss goal of 4 pounds per month   You are referred to Dr Karilyn Cota for screening colonoscopy, his office will call you  Fasting lipid, HBA1C  And chem 7 in 4 month

## 2013-06-28 NOTE — Progress Notes (Signed)
Patient in complaining of sore throat x 2 days.  Fever noted on 7/20 with chills.  Rash also noted to palms of hands.

## 2013-06-28 NOTE — Progress Notes (Signed)
  Subjective:    Patient ID: [name], female    DOB: 26-Dec-1960, 52 y.o.   MRN: [id number]  HPI 2 day h/o acute sore throat with fever and chills , no other symptoms. Specifically denies URI symptoms or productive cough or chest congestion. Has also noted a red palmar rash with symptom onsetPrior to this has been well, just no weight loss or conisitent lifestyle change   Review of Systems See HPI . Denies sinus pressure, nasal congestion, ear pain  Denies chest congestion, productive cough or wheezing. Denies chest pains, palpitations and leg swelling Denies abdominal pain, nausea, vomiting,diarrhea or constipation.   Denies dysuria, frequency, hesitancy or incontinence. Denies joint pain, swelling and limitation in mobility. Denies headaches, seizures, numbness, or tingling. Denies depression, anxiety or insomnia.         Objective:   Physical Exam  Patient alert and oriented and in no cardiopulmonary distress.  HEENT: No facial asymmetry, EOMI, no sinus tenderness,  oropharynx purulent exudate right oropharygeal area.  Neck supple tender right anterior cervical  adenopathy.  Chest: Clear to auscultation bilaterally.  CVS: S1, S2 no murmurs, no S3.  ABD: Soft non tender. Bowel sounds normal.  Ext: No edema  MS: Adequate ROM spine, shoulders, hips and knees.  Skin: Intact, no ulcerations or rash noted.  Psych: Good eye contact, normal affect. Memory intact not anxious or depressed appearing.  CNS: CN 2-12 intact, power, tone and sensation normal throughout.       Assessment & Plan:

## 2013-06-29 ENCOUNTER — Telehealth: Payer: Self-pay | Admitting: Family Medicine

## 2013-06-29 ENCOUNTER — Other Ambulatory Visit: Payer: Self-pay | Admitting: Family Medicine

## 2013-06-29 DIAGNOSIS — R21 Rash and other nonspecific skin eruption: Secondary | ICD-10-CM

## 2013-06-29 NOTE — Telephone Encounter (Signed)
Spoke with pt, says she has a lot of bumps on her fingers and palm which burn, more than at visit yesterday. Will arrange for derm eval asap, she agrees  Pls refer , this has been entered

## 2013-06-29 NOTE — Telephone Encounter (Signed)
Please advise 

## 2013-07-02 ENCOUNTER — Encounter (INDEPENDENT_AMBULATORY_CARE_PROVIDER_SITE_OTHER): Payer: Self-pay | Admitting: *Deleted

## 2013-07-05 ENCOUNTER — Encounter: Payer: Self-pay | Admitting: Family Medicine

## 2013-07-05 DIAGNOSIS — E785 Hyperlipidemia, unspecified: Secondary | ICD-10-CM | POA: Insufficient documentation

## 2013-07-05 DIAGNOSIS — E8881 Metabolic syndrome: Secondary | ICD-10-CM | POA: Insufficient documentation

## 2013-07-05 NOTE — Assessment & Plan Note (Signed)
Negative rapisd strep but history and exam are c/w strep infection, treated as such

## 2013-07-05 NOTE — Assessment & Plan Note (Signed)
Controlled, no change in medication DASH diet and commitment to daily physical activity for a minimum of 30 minutes discussed and encouraged, as a part of hypertension management. The importance of attaining a healthy weight is also discussed.  

## 2013-07-05 NOTE — Assessment & Plan Note (Signed)
Hyperlipidemia:Low fat diet discussed and encouraged.  Updated lab next visit 

## 2013-07-05 NOTE — Assessment & Plan Note (Signed)
Pt at increased CVD risk, importance of weight loss and healthy lifestyle stressed to reverse this

## 2013-07-05 NOTE — Assessment & Plan Note (Signed)
Improved. Pt applauded on succesful weight loss through lifestyle change, and encouraged to continue same. Weight loss goal set for the next several months. Start half [phentermine daily to help with appetite suppression

## 2013-07-06 ENCOUNTER — Other Ambulatory Visit: Payer: Self-pay | Admitting: Family Medicine

## 2013-07-06 DIAGNOSIS — Z139 Encounter for screening, unspecified: Secondary | ICD-10-CM

## 2013-07-08 ENCOUNTER — Telehealth: Payer: Self-pay | Admitting: Family Medicine

## 2013-07-08 ENCOUNTER — Ambulatory Visit (HOSPITAL_COMMUNITY)
Admission: RE | Admit: 2013-07-08 | Discharge: 2013-07-08 | Disposition: A | Discharge status: Home / Self Care | Payer: BC Managed Care – PPO | Source: Ambulatory Visit | Attending: Family Medicine | Admitting: Family Medicine

## 2013-07-08 DIAGNOSIS — Z1231 Encounter for screening mammogram for malignant neoplasm of breast: Secondary | ICD-10-CM | POA: Insufficient documentation

## 2013-07-08 DIAGNOSIS — Z139 Encounter for screening, unspecified: Secondary | ICD-10-CM

## 2013-07-08 NOTE — Telephone Encounter (Signed)
The tried several times to explain to patient and then the patient told Dewayne Hatch to call her when she has opening that time of day and Dewayne Hatch can not do that or remember that  Also Dewayne Hatch told her nicely that there were two other Drs in [place] that she could see maybe they could get her in before school starts and she stated to Dewayne Hatch that she would call Dr. Lodema Hong just heads up

## 2013-07-09 ENCOUNTER — Encounter (INDEPENDENT_AMBULATORY_CARE_PROVIDER_SITE_OTHER): Payer: Self-pay | Admitting: *Deleted

## 2013-07-09 ENCOUNTER — Telehealth (INDEPENDENT_AMBULATORY_CARE_PROVIDER_SITE_OTHER): Payer: Self-pay | Admitting: *Deleted

## 2013-07-09 ENCOUNTER — Other Ambulatory Visit (INDEPENDENT_AMBULATORY_CARE_PROVIDER_SITE_OTHER): Payer: Self-pay | Admitting: *Deleted

## 2013-07-09 DIAGNOSIS — Z1211 Encounter for screening for malignant neoplasm of colon: Secondary | ICD-10-CM

## 2013-07-09 NOTE — Telephone Encounter (Signed)
  Procedure: tcs  Reason/Indication:  screening  Has patient had this procedure before?  no  If so, when, by whom and where?    Is there a family history of colon cancer?  no  Who?  What age when diagnosed?    Is patient diabetic?   no      Does patient have prosthetic heart valve?  no  Do you have a pacemaker?  no  Has patient ever had endocarditis? no  Has patient had joint replacement within last 12 months?  no  Is patient on Coumadin, Plavix and/or Aspirin? yes  Medications: see EPIC  Allergies: see EPIC  Medication Adjustment: asa 2 days  Procedure date & time: 07/29/13 at 325

## 2013-07-09 NOTE — Telephone Encounter (Signed)
noted 

## 2013-07-12 ENCOUNTER — Encounter (HOSPITAL_COMMUNITY): Payer: Self-pay | Admitting: Pharmacy Technician

## 2013-07-12 MED ORDER — PEG 3350-KCL-NA BICARB-NACL 420 G PO SOLR: 4000.0000 mL | Freq: Once | ORAL | Status: DC

## 2013-07-12 NOTE — Telephone Encounter (Signed)
agree

## 2013-07-29 ENCOUNTER — Encounter (HOSPITAL_COMMUNITY): Admission: RE | Payer: Self-pay | Source: Ambulatory Visit

## 2013-07-29 ENCOUNTER — Ambulatory Visit (HOSPITAL_COMMUNITY)
Admission: RE | Admit: 2013-07-29 | Payer: BC Managed Care – PPO | Source: Ambulatory Visit | Admitting: Internal Medicine

## 2013-07-29 SURGERY — COLONOSCOPY
Anesthesia: Moderate Sedation

## 2013-10-14 ENCOUNTER — Other Ambulatory Visit: Payer: Self-pay

## 2013-10-15 ENCOUNTER — Other Ambulatory Visit: Payer: Self-pay | Admitting: Family Medicine

## 2013-10-27 ENCOUNTER — Ambulatory Visit: Payer: BC Managed Care – PPO | Admitting: Family Medicine

## 2013-11-22 ENCOUNTER — Other Ambulatory Visit: Payer: Self-pay | Admitting: Family Medicine

## 2013-12-08 ENCOUNTER — Ambulatory Visit (INDEPENDENT_AMBULATORY_CARE_PROVIDER_SITE_OTHER): Payer: BC Managed Care – PPO

## 2013-12-08 ENCOUNTER — Encounter: Payer: Self-pay | Admitting: Podiatry

## 2013-12-08 ENCOUNTER — Ambulatory Visit (INDEPENDENT_AMBULATORY_CARE_PROVIDER_SITE_OTHER): Payer: BC Managed Care – PPO | Admitting: Podiatry

## 2013-12-08 VITALS — BP 131/79 | HR 71 | Resp 16 | Ht 66.0 in | Wt 240.0 lb

## 2013-12-08 DIAGNOSIS — M7661 Achilles tendinitis, right leg: Secondary | ICD-10-CM

## 2013-12-08 DIAGNOSIS — M7660 Achilles tendinitis, unspecified leg: Secondary | ICD-10-CM

## 2013-12-08 DIAGNOSIS — M7731 Calcaneal spur, right foot: Secondary | ICD-10-CM

## 2013-12-08 DIAGNOSIS — M2010 Hallux valgus (acquired), unspecified foot: Secondary | ICD-10-CM

## 2013-12-08 DIAGNOSIS — M7730 Calcaneal spur, unspecified foot: Secondary | ICD-10-CM

## 2013-12-08 MED ORDER — TRIAMCINOLONE ACETONIDE 10 MG/ML IJ SUSP
10.0000 mg | Freq: Once | INTRAMUSCULAR | Status: AC
  Administered 2013-12-08: 10 mg

## 2013-12-08 MED ORDER — DICLOFENAC SODIUM 75 MG PO TBEC: 75.0000 mg | DELAYED_RELEASE_TABLET | Freq: Two times a day (BID) | ORAL | Status: DC

## 2013-12-08 NOTE — Progress Notes (Signed)
   Subjective:    Patient ID: [name], female    DOB: 1961-03-22, 52 y.o.   MRN: [id number]  HPI Comments: "I think I have a heel spur"  Pt states her posterior heel has been aching for about 1 month. It swells a lot and occasionally she has morning pain. She has tried elevating and asper cream rub, icing and Aleve. Some relief with treatment tried. Flat shoes makes it worse. Shoes with a heel actually makes it feel better.  Foot Pain Associated symptoms include arthralgias.      Review of Systems  Constitutional: Positive for unexpected weight change.  Musculoskeletal: Positive for arthralgias and gait problem.  All other systems reviewed and are negative.       Objective:   Physical Exam        Assessment & Plan:

## 2013-12-08 NOTE — Patient Instructions (Signed)

## 2013-12-10 NOTE — Progress Notes (Signed)
Subjective:     Patient ID: [name], female   DOB: 10/18/1961, 53 y.o.   MRN: [id number]  Foot Pain   patient points to the back of the right heel stating it has been bothering me for several months and making shoe gear ambulation and activity difficult. Does not remember specific injury or reason why this started   Review of Systems  All other systems reviewed and are negative.       Objective:   Physical Exam  Nursing note and vitals reviewed. Constitutional: She is oriented to person, place, and time.  Cardiovascular: Intact distal pulses.   Musculoskeletal: Normal range of motion.  Neurological: She is oriented to person, place, and time.  Skin: Skin is warm.   neurovascular status intact with health history unchanged and no muscle strength loss with mild equinus condition noted of both feet. Exquisite discomfort noted in the posterior heel region medial side and into the central area with the lateral side not tender at the current time. Enlargement of the bone structure noted posterior heel both feet     Assessment:     Achilles tendinitis right foot with acute inflammation and probable bone spur formation    Plan:     H&P and x-ray reviewed. Explained condition and at this time did a careful medial injection 3 mg dexamethasone Kenalog and Xylocaine 5 mg. Before initiating injection did explained risk associated with steroid injection around the Achilles tendon and patient was aware. Dispensed air fracture walker to immobilize the posterior heel and reappoint in 3 weeks earlier if any issues should occur

## 2014-01-03 ENCOUNTER — Other Ambulatory Visit: Payer: Self-pay | Admitting: Family Medicine

## 2014-01-06 ENCOUNTER — Ambulatory Visit: Payer: BC Managed Care – PPO | Admitting: Podiatry

## 2014-01-17 ENCOUNTER — Ambulatory Visit (INDEPENDENT_AMBULATORY_CARE_PROVIDER_SITE_OTHER): Payer: BC Managed Care – PPO | Admitting: Podiatry

## 2014-01-17 ENCOUNTER — Encounter: Payer: Self-pay | Admitting: Podiatry

## 2014-01-17 VITALS — BP 124/74 | HR 86 | Resp 12

## 2014-01-17 DIAGNOSIS — M7660 Achilles tendinitis, unspecified leg: Secondary | ICD-10-CM

## 2014-01-18 NOTE — Progress Notes (Signed)
Subjective:     Patient ID: [name], female   DOB: [birth year], 53 y.o.   MRN: [id number]  HPI patient points to the posterior aspect right heel stating it is improved but still tender it time   Review of Systems     Objective:   Physical Exam Neurovascular status intact with no health history changes noted and posterior aspect heel that has improved but is still tender when pressed    Assessment:     Achilles tendinitis which is showing improvement right    Plan:     Advised on physical therapy and dispensed heel lifts to wear in order to reduce the stress on the posterior heel region reappoint if symptoms continue

## 2014-02-14 ENCOUNTER — Ambulatory Visit: Payer: BC Managed Care – PPO | Admitting: Podiatry

## 2014-03-03 ENCOUNTER — Ambulatory Visit: Payer: BC Managed Care – PPO | Admitting: Podiatry

## 2014-04-12 ENCOUNTER — Ambulatory Visit (INDEPENDENT_AMBULATORY_CARE_PROVIDER_SITE_OTHER): Payer: BC Managed Care – PPO | Admitting: Family Medicine

## 2014-04-12 ENCOUNTER — Encounter: Payer: Self-pay | Admitting: Family Medicine

## 2014-04-12 ENCOUNTER — Encounter (INDEPENDENT_AMBULATORY_CARE_PROVIDER_SITE_OTHER): Payer: Self-pay

## 2014-04-12 ENCOUNTER — Ambulatory Visit (HOSPITAL_COMMUNITY)
Admission: RE | Admit: 2014-04-12 | Discharge: 2014-04-12 | Disposition: A | Discharge status: Home / Self Care | Payer: BC Managed Care – PPO | Source: Ambulatory Visit | Attending: Family Medicine | Admitting: Family Medicine

## 2014-04-12 VITALS — BP 130/82 | HR 88 | Temp 99.1°F | Resp 16 | Ht 66.0 in | Wt 280.1 lb

## 2014-04-12 DIAGNOSIS — E049 Nontoxic goiter, unspecified: Secondary | ICD-10-CM

## 2014-04-12 DIAGNOSIS — J209 Acute bronchitis, unspecified: Secondary | ICD-10-CM

## 2014-04-12 DIAGNOSIS — J0100 Acute maxillary sinusitis, unspecified: Secondary | ICD-10-CM | POA: Insufficient documentation

## 2014-04-12 DIAGNOSIS — E785 Hyperlipidemia, unspecified: Secondary | ICD-10-CM

## 2014-04-12 DIAGNOSIS — E8881 Metabolic syndrome: Secondary | ICD-10-CM

## 2014-04-12 DIAGNOSIS — J309 Allergic rhinitis, unspecified: Secondary | ICD-10-CM

## 2014-04-12 DIAGNOSIS — R05 Cough: Secondary | ICD-10-CM | POA: Insufficient documentation

## 2014-04-12 DIAGNOSIS — J019 Acute sinusitis, unspecified: Secondary | ICD-10-CM

## 2014-04-12 DIAGNOSIS — I10 Essential (primary) hypertension: Secondary | ICD-10-CM

## 2014-04-12 DIAGNOSIS — J029 Acute pharyngitis, unspecified: Secondary | ICD-10-CM

## 2014-04-12 DIAGNOSIS — J9819 Other pulmonary collapse: Secondary | ICD-10-CM | POA: Insufficient documentation

## 2014-04-12 DIAGNOSIS — R059 Cough, unspecified: Secondary | ICD-10-CM | POA: Insufficient documentation

## 2014-04-12 DIAGNOSIS — J3489 Other specified disorders of nose and nasal sinuses: Secondary | ICD-10-CM | POA: Insufficient documentation

## 2014-04-12 DIAGNOSIS — R0989 Other specified symptoms and signs involving the circulatory and respiratory systems: Secondary | ICD-10-CM | POA: Insufficient documentation

## 2014-04-12 LAB — POCT RAPID STREP A (OFFICE): RAPID STREP A SCREEN: NEGATIVE

## 2014-04-12 MED ORDER — METHYLPREDNISOLONE ACETATE 80 MG/ML IJ SUSP
80.0000 mg | Freq: Once | INTRAMUSCULAR | Status: AC
  Administered 2014-04-12: 80 mg via INTRAMUSCULAR

## 2014-04-12 MED ORDER — CEFTRIAXONE SODIUM 1 G IJ SOLR
500.0000 mg | Freq: Once | INTRAMUSCULAR | Status: AC
  Administered 2014-04-12: 500 mg via INTRAMUSCULAR

## 2014-04-12 MED ORDER — BENZONATATE 100 MG PO CAPS
100.0000 mg | ORAL_CAPSULE | Freq: Three times a day (TID) | ORAL | Status: DC
Start: 2014-04-12 — End: 2014-11-07

## 2014-04-12 MED ORDER — LEVOFLOXACIN 750 MG PO TABS: 750.0000 mg | ORAL_TABLET | Freq: Every day | ORAL | Status: DC

## 2014-04-12 MED ORDER — FLUCONAZOLE 150 MG PO TABS: ORAL_TABLET | ORAL | Status: DC

## 2014-04-12 MED ORDER — PREDNISONE 5 MG PO TABS: 5.0000 mg | ORAL_TABLET | Freq: Two times a day (BID) | ORAL | Status: AC

## 2014-04-12 NOTE — Progress Notes (Signed)
   Subjective:    Patient ID: [name], female    DOB: 1961-05-24, 53 y.o.   MRN: [id number]  HPI 1 week h/o worsening head and chest congestion, associated with fever and chills intermittently. Nasal drainage has thickened , and is yellowish green, and at times bloody. Sputum is thick and yellow. C/o bilateral ear pressure, denies hearing loss and she does c/o  sore throat. Increasing fatigue , poor appetitie and sleep disturbed by cough. No improvement with OTC medication as well as medication prescribed at Urgent care Recent labs, cancxer screening and immunization is reviewed and updated also    Review of Systems See HPI Denies chest pains, palpitations and leg swelling Denies abdominal pain, nausea, vomiting,diarrhea or constipation.   Denies dysuria, frequency, hesitancy or incontinence. Denies joint pain, swelling and limitation in mobility. Denies headaches, seizures, numbness, or tingling. Denies depression, anxiety or insomnia. Denies skin break down or rash.        Objective:   Physical Exam BP 130/82 mmHg  Pulse 88  Temp(Src) 99.1 F (37.3 C) (Oral)  Resp 16  Ht 5\' 6"  (1.676 m)  Wt 280 lb 1.9 oz (127.062 kg)  BMI 45.23 kg/m2  SpO2 97%  LMP 10/19/2011 Patient alert and oriented and in no cardiopulmonary distress.Ill appearing  HEENT: No facial asymmetry, EOMI,   oropharynx pink and moist.  Neck supple no JVD, anterior cerrvical adenitis. Maxillary sinuses tender, nasal mucosa erythematous and edematous  Chest: decreased though adequate air entry, scattered crackles , no wheezes  CVS: S1, S2 no murmurs, no S3.Regular rate.  ABD: Soft non tender.   Ext: No edema  MS: Adequate ROM spine, shoulders, hips and knees.  Skin: Intact, no ulcerations or rash noted.  Psych: Good eye contact, normal affect. Memory intact not anxious or depressed appearing.  CNS: CN 2-12 intact, power,  normal throughout.no focal deficits noted.        Assessment &  Plan:  Acute bronchitis Antibiotic and decongestants prescribed cXR  Today Depo medrol and rocephin administered at visit  Sinusitis, acute Antibiotic prescribed, imaging study obtained  Morbid obesity Deteriorated. Patient re-educated about  the importance of commitment to a  minimum of 150 minutes of exercise per week. The importance of healthy food choices with portion control discussed. Encouraged to start a food diary, count calories and to consider  joining a support group. Sample diet sheets offered. Goals set by the patient for the next several months.     Metabolic syndrome X The increased risk of cardiovascular disease associated with this diagnosis, and the need to consistently work on lifestyle to change this is discussed. Following  a  heart healthy diet ,commitment to 30 minutes of exercise at least 5 days per week, as well as control of blood sugar and cholesterol , and achieving a healthy weight are all the areas to be addressed .   Dyslipidemia Hyperlipidemia:Low fat diet discussed and encouraged.  Updated lab needed at/ before next visit.   HTN (hypertension) Controlled, no change in medication DASH diet and commitment to daily physical activity for a minimum of 30 minutes discussed and encouraged, as a part of hypertension management. The importance of attaining a healthy weight is also discussed.   Sore throat Rapid strep test at visit is negative for strep infection, no exudate present on exam and pt afebrile

## 2014-04-12 NOTE — Patient Instructions (Addendum)
F/u in 2 month, call if you need me before  You are treated for acute sinusitis and bronchitis, rocephin and depo medrol are administered inoffce Rapid strep test is negative  Xrays are ordered of your sinuses and chest  Take all penicillin prescribed, and new additional antibiotic is levaquin for 5 days  New is prednisone, tesssalon perles, continue daily n saline flushes and start using your flonase  daily also  Fluconazole is prescribed for as needed use if you start having vaginal iotch due to antibiotic use  CBc, fasting lipid, chem 7, hBA1C and TSH in 2 month, before next visit  You need a colonoscopy for colon ca screening please let me know who you wish to see about this

## 2014-04-20 ENCOUNTER — Other Ambulatory Visit: Payer: Self-pay | Admitting: Family Medicine

## 2014-06-28 ENCOUNTER — Other Ambulatory Visit: Payer: Self-pay | Admitting: Family Medicine

## 2014-06-28 DIAGNOSIS — Z1231 Encounter for screening mammogram for malignant neoplasm of breast: Secondary | ICD-10-CM

## 2014-06-30 ENCOUNTER — Ambulatory Visit (HOSPITAL_COMMUNITY): Payer: BC Managed Care – PPO

## 2014-07-04 ENCOUNTER — Other Ambulatory Visit (INDEPENDENT_AMBULATORY_CARE_PROVIDER_SITE_OTHER): Payer: Self-pay | Admitting: *Deleted

## 2014-07-04 DIAGNOSIS — Z1211 Encounter for screening for malignant neoplasm of colon: Secondary | ICD-10-CM

## 2014-07-18 ENCOUNTER — Ambulatory Visit (HOSPITAL_COMMUNITY)
Admission: RE | Admit: 2014-07-18 | Discharge: 2014-07-18 | Disposition: A | Discharge status: Home / Self Care | Payer: BC Managed Care – PPO | Source: Ambulatory Visit | Attending: Family Medicine | Admitting: Family Medicine

## 2014-07-18 DIAGNOSIS — Z1231 Encounter for screening mammogram for malignant neoplasm of breast: Secondary | ICD-10-CM | POA: Insufficient documentation

## 2014-08-10 ENCOUNTER — Telehealth (INDEPENDENT_AMBULATORY_CARE_PROVIDER_SITE_OTHER): Payer: Self-pay | Admitting: *Deleted

## 2014-08-10 DIAGNOSIS — Z1211 Encounter for screening for malignant neoplasm of colon: Secondary | ICD-10-CM

## 2014-08-10 NOTE — Telephone Encounter (Signed)
Patient needs movi prep 

## 2014-08-12 MED ORDER — PEG-KCL-NACL-NASULF-NA ASC-C 100 G PO SOLR: 1.0000 | Freq: Once | ORAL | Status: DC

## 2014-08-24 ENCOUNTER — Telehealth (INDEPENDENT_AMBULATORY_CARE_PROVIDER_SITE_OTHER): Payer: Self-pay | Admitting: *Deleted

## 2014-08-24 NOTE — Telephone Encounter (Signed)
Agree. Patient is on Diclofenac BID. Can cause bleeding.

## 2014-08-24 NOTE — Telephone Encounter (Signed)
  Procedure: tcs  Reason/Indication:  screening  Has patient had this procedure before?  no  If so, when, by whom and where?    Is there a family history of colon cancer?  no  Who?  What age when diagnosed?    Is patient diabetic?   no      Does patient have prosthetic heart valve?  no  Do you have a pacemaker?  no  Has patient ever had endocarditis? no  Has patient had joint replacement within last 12 months?  no  Does patient tend to be constipated or take laxatives? no  Is patient on Coumadin, Plavix and/or Aspirin? yes  Medications: see epic  Allergies: see epic  Medication Adjustment: asa 2 days  Procedure date & time: 09/15/14 at 830

## 2014-09-06 ENCOUNTER — Other Ambulatory Visit: Payer: Self-pay | Admitting: Family Medicine

## 2014-09-15 ENCOUNTER — Encounter (HOSPITAL_COMMUNITY): Payer: Self-pay | Admitting: *Deleted

## 2014-09-15 ENCOUNTER — Ambulatory Visit (HOSPITAL_COMMUNITY)
Admission: RE | Admit: 2014-09-15 | Discharge: 2014-09-15 | Disposition: A | Discharge status: Home / Self Care | Payer: BC Managed Care – PPO | Source: Ambulatory Visit | Attending: Internal Medicine | Admitting: Internal Medicine

## 2014-09-15 ENCOUNTER — Encounter (HOSPITAL_COMMUNITY)
Admission: RE | Disposition: A | Discharge status: Home / Self Care | Payer: Self-pay | Source: Ambulatory Visit | Attending: Internal Medicine

## 2014-09-15 DIAGNOSIS — Z1211 Encounter for screening for malignant neoplasm of colon: Secondary | ICD-10-CM

## 2014-09-15 DIAGNOSIS — I10 Essential (primary) hypertension: Secondary | ICD-10-CM | POA: Insufficient documentation

## 2014-09-15 DIAGNOSIS — D122 Benign neoplasm of ascending colon: Secondary | ICD-10-CM | POA: Diagnosis not present

## 2014-09-15 DIAGNOSIS — Z882 Allergy status to sulfonamides status: Secondary | ICD-10-CM | POA: Diagnosis not present

## 2014-09-15 DIAGNOSIS — Z79899 Other long term (current) drug therapy: Secondary | ICD-10-CM | POA: Insufficient documentation

## 2014-09-15 DIAGNOSIS — K644 Residual hemorrhoidal skin tags: Secondary | ICD-10-CM | POA: Insufficient documentation

## 2014-09-15 DIAGNOSIS — K649 Unspecified hemorrhoids: Secondary | ICD-10-CM

## 2014-09-15 HISTORY — PX: COLONOSCOPY: SHX5424

## 2014-09-15 HISTORY — DX: Other seasonal allergic rhinitis: J30.2

## 2014-09-15 SURGERY — COLONOSCOPY
Anesthesia: Moderate Sedation

## 2014-09-15 MED ORDER — SODIUM CHLORIDE 0.9 % IV SOLN
INTRAVENOUS | Status: DC
  Administered 2014-09-15: 08:00:00 via INTRAVENOUS

## 2014-09-15 MED ORDER — MIDAZOLAM HCL 5 MG/5ML IJ SOLN
INTRAMUSCULAR | Status: DC
Start: 2014-09-15 — End: 2014-09-15
  Filled 2014-09-15: qty 5

## 2014-09-15 MED ORDER — STERILE WATER FOR IRRIGATION IR SOLN
Status: DC | PRN
  Administered 2014-09-15: 09:00:00

## 2014-09-15 MED ORDER — MEPERIDINE HCL 50 MG/ML IJ SOLN
INTRAMUSCULAR | Status: DC | PRN
Start: 2014-09-15 — End: 2014-09-15
  Administered 2014-09-15 (×2): 25 mg via INTRAVENOUS

## 2014-09-15 MED ORDER — MIDAZOLAM HCL 5 MG/5ML IJ SOLN
INTRAMUSCULAR | Status: AC
  Filled 2014-09-15: qty 10

## 2014-09-15 MED ORDER — MIDAZOLAM HCL 5 MG/5ML IJ SOLN
INTRAMUSCULAR | Status: DC | PRN
  Administered 2014-09-15 (×6): 2 mg via INTRAVENOUS

## 2014-09-15 MED ORDER — MEPERIDINE HCL 50 MG/ML IJ SOLN
INTRAMUSCULAR | Status: AC
  Filled 2014-09-15: qty 1

## 2014-09-15 NOTE — H&P (Addendum)
[name] is an 53 y.o. female.   Chief Complaint: Patient is here for colonoscopy. HPI: Patient is  53 year old [place] American female who is here for screening colonoscopy. She denies abdominal pain change in bowel habits or rectal bleeding.  Family history is negative for CRC.  Past Medical History  Diagnosis Date  . Hypertension   . Seasonal allergies     Past Surgical History  Procedure Laterality Date  . Cyst removed from buttock  1980's    Family History  Problem Relation Age of Onset  . Colon cancer Neg Hx    Social History:  reports that she has never smoked. She has never used smokeless tobacco. She reports that she does not drink alcohol or use illicit drugs.  Allergies:  Allergies  Allergen Reactions  . Sulfamethoxazole-Trimethoprim     REACTION: nausea    Medications Prior to Admission  Medication Sig Dispense Refill  . labetalol (NORMODYNE) 200 MG tablet TAKE ONE TABLET BY MOUTH ONCE DAILY  30 tablet  1  . peg 3350 powder (MOVIPREP) 100 G SOLR Take 1 kit (200 g total) by mouth once.  1 kit  0  . benzonatate (TESSALON) 100 MG capsule Take 1 capsule (100 mg total) by mouth 3 (three) times daily.  30 capsule  0  . cholecalciferol (VITAMIN D) 1000 UNITS tablet Take 1,000 Units by mouth daily.      . diclofenac (VOLTAREN) 75 MG EC tablet Take 1 tablet (75 mg total) by mouth 2 (two) times daily.  60 tablet  1  . fish oil-omega-3 fatty acids 1000 MG capsule Take 1 g by mouth daily.      . fluconazole (DIFLUCAN) 150 MG tablet One tablet once daily, as needed, for vaginal itch, due to antibiotic  3 tablet  0  . HYDROcodone-homatropine (HYCODAN) 5-1.5 MG/5ML syrup       . levofloxacin (LEVAQUIN) 750 MG tablet Take 1 tablet (750 mg total) by mouth daily.  5 tablet  0  . Multiple Vitamin (MULTIVITAMIN) tablet Take 1 tablet by mouth daily.        . vitamin C (ASCORBIC ACID) 500 MG tablet Take 500 mg by mouth daily.        No results found for this or any previous  visit (from the past 48 hour(s)). No results found.  ROS  Blood pressure 143/77, pulse 58, temperature 97.9 F (36.6 C), temperature source Oral, resp. rate 18, height 5' 6"  (1.676 m), weight 270 lb (122.471 kg), last menstrual period 10/19/2011, SpO2 100.00%. Physical Exam  Constitutional: She appears well-developed and well-nourished.  HENT:  Mouth/Throat: Oropharynx is clear and moist.  Eyes: Conjunctivae are normal. No scleral icterus.  Neck: No thyromegaly present.  Cardiovascular: Normal rate, regular rhythm and normal heart sounds.   No murmur heard. Respiratory: Effort normal and breath sounds normal.  GI: Soft. She exhibits no distension and no mass. There is no tenderness.  Musculoskeletal: She exhibits no edema.  Lymphadenopathy:    She has no cervical adenopathy.  Neurological: She is alert.  Skin: Skin is warm and dry.     Assessment/Plan Average risk screening colonoscopy.  REHMAN,NAJEEB U 09/15/2014, 8:34 AM

## 2014-09-15 NOTE — Op Note (Signed)
[facility] [address] [place], [address]   COLONOSCOPY PROCEDURE REPORT     EXAM DATE: 09-16-2014  PATIENT NAME:      [name], [name]           MR #:      [id number]  BIRTHDATE:       [birth year]      VISIT #:     [phone]  ATTENDING:     Hildred Laser, MD     STATUS:     outpatient REFERRING MD:      [name], M.D. ASA CLASS:        Class I  INDICATIONS:  The patient is a 53 yr old female here for a colonoscopy due to average risk for colorectal cancer. PROCEDURE PERFORMED:     Colonoscopy, screening and Colonoscopy with biopsy MEDICATIONS:     Cetacaine spray for oral pharyngeal topical anesthesia, Meperidine (Demerol) 50 mg IV, and Versed 12 mg IV  ESTIMATED BLOOD LOSS:     None  CONSENT: The patient understands the risks and benefits of the procedure and understands that these risks include, but are not limited to: sedation, allergic reaction, infection, perforation and/or bleeding. Alternative means of evaluation and treatment include, among others: physical exam, x-rays, and/or surgical intervention. The patient elects to proceed with this endoscopic procedure.  DESCRIPTION OF PROCEDURE: During intra-op preparation period all mechanical & medical equipment was checked for proper function. Hand hygiene and appropriate measures for infection prevention was taken. After the risks, benefits and alternatives of the procedure were thoroughly explained, Informed consent was verified, confirmed and timeout was successfully executed by the treatment team. A digital exam revealed no abnormalities of the rectum.      The [id number] ([id number]) endoscope was introduced through the anus and advanced to the terminal ileum which was intubated for a short distance. No adverse events experienced. The prep was adequate.. The instrument was then slowly withdrawn as the colon was fully examined.   COLON FINDINGS: The examined terminal ileum appeared  to be normal. A single polyp measuring 3 mm in size was found in the ascending colon.  Multiple biopsies were performed using cold forceps. Sample was obtained and sent to histology.   Small external hemorrhoids were found. Retroflexion was performed.  The scope was then completely withdrawn from the patient and the procedure terminated. WITHDRAWAL TIME: 11 minutes 0 seconds    ADVERSE EVENTS:      There were no immediate complications.  IMPRESSIONS:     1.  The examined terminal ileum appeared to be normal 2.  Single polyp was found in the ascending colon; multiple biopsies were performed using cold forceps 3.  Small external hemorrhoids  RECOMMENDATIONS:     Await biopsy results RECALL:  Hildred Laser, MD eSigned:  Hildred Laser, MD 09/16/2014 9:15 AM   cc:  CPT CODES: ICD CODES:  The ICD and CPT codes recommended by this software are interpretations from the data that the clinical staff has captured with the software.  The verification of the translation of this report to the ICD and CPT codes and modifiers is the sole responsibility of the health care institution and practicing physician where this report was generated.  [employer] will not be held responsible for the validity of the ICD and CPT codes included on this report.  AMA assumes no liability for data contained or not contained herein. CPT is a Designer, television/film set of the [employer].

## 2014-09-15 NOTE — Discharge Instructions (Signed)
Resume usual medications and diet. °No driving for 24 hours. °Physician will call with biopsy results. ° °Colonoscopy, Care After °Refer to this sheet in the next few weeks. These instructions provide you with information on caring for yourself after your procedure. Your health care provider may also give you more specific instructions. Your treatment has been planned according to current medical practices, but problems sometimes occur. Call your health care provider if you have any problems or questions after your procedure. °WHAT TO EXPECT AFTER THE PROCEDURE  °After your procedure, it is typical to have the following: °· A small amount of blood in your stool. °· Moderate amounts of gas and mild abdominal cramping or bloating. °HOME CARE INSTRUCTIONS °· Do not drive, operate machinery, or sign important documents for 24 hours. °· You may shower and resume your regular physical activities, but move at a slower pace for the first 24 hours. °· Take frequent rest periods for the first 24 hours. °· Walk around or put a warm pack on your abdomen to help reduce abdominal cramping and bloating. °· Drink enough fluids to keep your urine clear or pale yellow. °· You may resume your normal diet as instructed by your health care provider. Avoid heavy or fried foods that are hard to digest. °· Avoid drinking alcohol for 24 hours or as instructed by your health care provider. °· Only take over-the-counter or prescription medicines as directed by your health care provider. °· If a tissue sample (biopsy) was taken during your procedure: °¨ Do not take aspirin or blood thinners for 7 days, or as instructed by your health care provider. °¨ Do not drink alcohol for 7 days, or as instructed by your health care provider. °¨ Eat soft foods for the first 24 hours. °SEEK MEDICAL CARE IF: °You have persistent spotting of blood in your stool 2-3 days after the procedure. °SEEK IMMEDIATE MEDICAL CARE IF: °· You have more than a small  spotting of blood in your stool. °· You pass large blood clots in your stool. °· Your abdomen is swollen (distended). °· You have nausea or vomiting. °· You have a fever. °· You have increasing abdominal pain that is not relieved with medicine. °Document Released: 07/09/2004 Document Revised: 09/15/2013 Document Reviewed: 08/02/2013 °ExitCare® Patient Information ©2015 [employer]. This information is not intended to replace advice given to you by your health care provider. Make sure you discuss any questions you have with your health care provider. ° °

## 2014-09-19 ENCOUNTER — Encounter (HOSPITAL_COMMUNITY): Payer: Self-pay | Admitting: Internal Medicine

## 2014-10-05 ENCOUNTER — Encounter (INDEPENDENT_AMBULATORY_CARE_PROVIDER_SITE_OTHER): Payer: Self-pay | Admitting: *Deleted

## 2014-10-16 DIAGNOSIS — J029 Acute pharyngitis, unspecified: Secondary | ICD-10-CM | POA: Insufficient documentation

## 2014-10-16 NOTE — Assessment & Plan Note (Signed)
Antibiotic prescribed, imaging study obtained

## 2014-10-16 NOTE — Assessment & Plan Note (Signed)
Hyperlipidemia:Low fat diet discussed and encouraged.  Updated lab needed at/ before next visit.  

## 2014-10-16 NOTE — Assessment & Plan Note (Signed)
The increased risk of cardiovascular disease associated with this diagnosis, and the need to consistently work on lifestyle to change this is discussed. Following  a  heart healthy diet ,commitment to 30 minutes of exercise at least 5 days per week, as well as control of blood sugar and cholesterol , and achieving a healthy weight are all the areas to be addressed .  

## 2014-10-16 NOTE — Assessment & Plan Note (Signed)
Deteriorated. Patient re-educated about  the importance of commitment to a  minimum of 150 minutes of exercise per week. The importance of healthy food choices with portion control discussed. Encouraged to start a food diary, count calories and to consider  joining a support group. Sample diet sheets offered. Goals set by the patient for the next several months.    

## 2014-10-16 NOTE — Assessment & Plan Note (Addendum)
Antibiotic and decongestants prescribed cXR  Today Depo medrol and rocephin administered at visit

## 2014-10-16 NOTE — Assessment & Plan Note (Signed)
Controlled, no change in medication DASH diet and commitment to daily physical activity for a minimum of 30 minutes discussed and encouraged, as a part of hypertension management. The importance of attaining a healthy weight is also discussed.  

## 2014-10-16 NOTE — Assessment & Plan Note (Signed)
Rapid strep test at visit is negative for strep infection, no exudate present on exam and pt afebrile

## 2014-10-17 ENCOUNTER — Telehealth: Payer: Self-pay

## 2014-10-17 DIAGNOSIS — E785 Hyperlipidemia, unspecified: Secondary | ICD-10-CM

## 2014-10-17 DIAGNOSIS — I10 Essential (primary) hypertension: Secondary | ICD-10-CM

## 2014-10-17 DIAGNOSIS — E049 Nontoxic goiter, unspecified: Secondary | ICD-10-CM

## 2014-10-17 DIAGNOSIS — R7302 Impaired glucose tolerance (oral): Secondary | ICD-10-CM

## 2014-10-17 NOTE — Telephone Encounter (Signed)
-----   Message from Fayrene Helper, MD sent at 10/16/2014  9:25 PM EST ----- Regarding: needs oV and fasting labs  pls attempt to contact and sched appt for pt for f/u, will need fasting labs before visit  pls order cbc, fasting lipid, cmp, HBa1C , TSH if pt sched appt

## 2014-10-17 NOTE — Telephone Encounter (Signed)
1st available is in January.   Patient given appt and lab order mailed to patient.

## 2014-10-17 NOTE — Telephone Encounter (Signed)
Noted, thanks!

## 2014-10-19 ENCOUNTER — Telehealth: Payer: Self-pay | Admitting: Family Medicine

## 2014-10-19 NOTE — Telephone Encounter (Signed)
Lab order mailed to patient.

## 2014-10-31 ENCOUNTER — Ambulatory Visit: Payer: BC Managed Care – PPO | Admitting: Family Medicine

## 2014-11-01 ENCOUNTER — Ambulatory Visit: Payer: BC Managed Care – PPO | Admitting: Family Medicine

## 2014-11-07 ENCOUNTER — Ambulatory Visit (INDEPENDENT_AMBULATORY_CARE_PROVIDER_SITE_OTHER): Payer: BC Managed Care – PPO | Admitting: Family Medicine

## 2014-11-07 ENCOUNTER — Encounter: Payer: Self-pay | Admitting: Family Medicine

## 2014-11-07 VITALS — BP 122/82 | HR 89 | Resp 16 | Ht 66.0 in | Wt 292.0 lb

## 2014-11-07 DIAGNOSIS — M545 Low back pain, unspecified: Secondary | ICD-10-CM

## 2014-11-07 DIAGNOSIS — E785 Hyperlipidemia, unspecified: Secondary | ICD-10-CM

## 2014-11-07 DIAGNOSIS — M546 Pain in thoracic spine: Secondary | ICD-10-CM

## 2014-11-07 DIAGNOSIS — E8881 Metabolic syndrome: Secondary | ICD-10-CM

## 2014-11-07 DIAGNOSIS — M25561 Pain in right knee: Secondary | ICD-10-CM

## 2014-11-07 DIAGNOSIS — I10 Essential (primary) hypertension: Secondary | ICD-10-CM

## 2014-11-07 DIAGNOSIS — R7302 Impaired glucose tolerance (oral): Secondary | ICD-10-CM

## 2014-11-07 MED ORDER — IBUPROFEN 800 MG PO TABS: 800.0000 mg | ORAL_TABLET | Freq: Three times a day (TID) | ORAL | Status: DC

## 2014-11-07 MED ORDER — PREDNISONE (PAK) 5 MG PO TABS: 5.0000 mg | ORAL_TABLET | ORAL | Status: DC

## 2014-11-07 MED ORDER — LORCASERIN HCL 10 MG PO TABS: 1.0000 | ORAL_TABLET | Freq: Two times a day (BID) | ORAL | Status: DC

## 2014-11-07 NOTE — Patient Instructions (Signed)
F/u in 3 month, call if you  need me befpore  Fasting labs this week  Xrays today  Meds sent to pharmacy  Weight loss goal of 5 pounds per month  You are referred to Pt for chronic back pain  Xray of right knee today

## 2014-11-07 NOTE — Progress Notes (Signed)
Subjective:    Patient ID: [name], female    DOB: 1961-01-23, 53 y.o.   MRN: [id number]  HPI The PT is here for follow up and re-evaluation of chronic medical conditions, medication management and review of any available recent lab and radiology data.  Preventive health is updated, specifically  Cancer screening and Immunization.   The PT denies any adverse reactions to current medications since the last visit.  3 week h/o increased back pain radiating to the  extremity, without any new  weakness and numbness in the extremity. Denies incontinence of stool or urine, and there is  no recent history of any  inciting trauma. 1 month h/o nincrease left knee pain, swelling stiffness and instability at times, denies falls No weight loss success due to inconsistent effort on her part, she reports, plans to change this      Review of Systems See HPI Denies recent fever or chills. Denies sinus pressure, nasal congestion, ear pain or sore throat. Denies chest congestion, productive cough or wheezing. Denies chest pains, palpitations and leg swelling Denies abdominal pain, nausea, vomiting,diarrhea or constipation.   Denies dysuria, frequency, hesitancy or incontinence. Denies headaches, seizures, numbness, or tingling. Denies depression, anxiety or insomnia. Denies skin break down or rash.         Objective:   Physical Exam BP 122/82 mmHg  Pulse 89  Resp 16  Ht 5\' 6"  (1.676 m)  Wt 292 lb (132.45 kg)  BMI 47.15 kg/m2  SpO2 98%  LMP 10/19/2011 Patient alert and oriented and in no cardiopulmonary distress.  HEENT: No facial asymmetry, EOMI,   oropharynx pink and moist.  Neck supple no JVD, no mass.  Chest: Clear to auscultation bilaterally.  CVS: S1, S2 no murmurs, no S3.Regular rate.  ABD: Soft non tender.   Ext: No edema  MS: decreased ROM thoracic spine, adequate in shoulders, hips and reduced in left knee which has swelling and crepitus  Skin: Intact, no  ulcerations or rash noted.  Psych: Good eye contact, normal affect. Memory intact not anxious or depressed appearing.  CNS: CN 2-12 intact, power,  normal throughout.no focal deficits noted.        Assessment & Plan:  Thoracic spine pain Acute onset , uncontrolled Oral course of anti inflammatories, xray and pT   Lumbar spine pain Increased and uncontrolled x 3 weeks, oral ant inflammatories , pT and xrays   Knee pain, right Progressive pain and swelling with instability, x ray to help eval the extent of osteoarthritis Weight loss encouraged   Morbid obesity Deteriorated. Patient re-educated about  the importance of commitment to a  minimum of 150 minutes of exercise per week. The importance of healthy food choices with portion control discussed. Encouraged to start a food diary, count calories and to consider  joining a support group. Sample diet sheets offered. Goals set by the patient for the next several months.      Metabolic syndrome X The increased risk of cardiovascular disease associated with this diagnosis, and the need to consistently work on lifestyle to change this is discussed. Following  a  heart healthy diet ,commitment to 30 minutes of exercise at least 5 days per week, as well as control of blood sugar and cholesterol , and achieving a healthy weight are all the areas to be addressed .    Dyslipidemia Hyperlipidemia:Low fat diet discussed and encouraged.  Updated lab needed at/ before next visit.    HTN (hypertension) Controlled, no change  in medication DASH diet and commitment to daily physical activity for a minimum of 30 minutes discussed and encouraged, as a part of hypertension management. The importance of attaining a healthy weight is also discussed.

## 2014-11-09 ENCOUNTER — Other Ambulatory Visit: Payer: Self-pay | Admitting: Family Medicine

## 2014-11-10 ENCOUNTER — Ambulatory Visit (HOSPITAL_COMMUNITY)
Admission: RE | Admit: 2014-11-10 | Discharge: 2014-11-10 | Disposition: A | Discharge status: Home / Self Care | Payer: BC Managed Care – PPO | Source: Ambulatory Visit | Attending: Family Medicine | Admitting: Family Medicine

## 2014-11-10 DIAGNOSIS — M545 Low back pain, unspecified: Secondary | ICD-10-CM

## 2014-11-10 DIAGNOSIS — M25561 Pain in right knee: Secondary | ICD-10-CM | POA: Diagnosis not present

## 2014-11-10 DIAGNOSIS — M546 Pain in thoracic spine: Secondary | ICD-10-CM | POA: Insufficient documentation

## 2014-11-10 DIAGNOSIS — G8929 Other chronic pain: Secondary | ICD-10-CM | POA: Diagnosis not present

## 2014-11-12 ENCOUNTER — Other Ambulatory Visit: Payer: Self-pay | Admitting: Family Medicine

## 2014-11-13 LAB — COMPREHENSIVE METABOLIC PANEL
ALK PHOS: 72 U/L (ref 39–117)
ALT: 13 U/L (ref 0–35)
AST: 13 U/L (ref 0–37)
Albumin: 3.7 g/dL (ref 3.5–5.2)
BUN: 25 mg/dL — AB (ref 6–23)
CALCIUM: 9.1 mg/dL (ref 8.4–10.5)
CO2: 28 mEq/L (ref 19–32)
CREATININE: 0.8 mg/dL (ref 0.50–1.10)
Chloride: 105 mEq/L (ref 96–112)
Glucose, Bld: 78 mg/dL (ref 70–99)
POTASSIUM: 4.2 meq/L (ref 3.5–5.3)
Sodium: 143 mEq/L (ref 135–145)
Total Bilirubin: 0.3 mg/dL (ref 0.2–1.2)
Total Protein: 6.5 g/dL (ref 6.0–8.3)

## 2014-11-13 LAB — LIPID PANEL
CHOL/HDL RATIO: 3 ratio
Cholesterol: 174 mg/dL (ref 0–200)
HDL: 58 mg/dL (ref >39)
LDL Cholesterol: 107 mg/dL — ABNORMAL HIGH (ref 0–99)
TRIGLYCERIDES: 47 mg/dL (ref <150)
VLDL: 9 mg/dL (ref 0–40)

## 2014-11-13 LAB — CBC
HEMATOCRIT: 35 % — AB (ref 36.0–46.0)
Hemoglobin: 11.4 g/dL — ABNORMAL LOW (ref 12.0–15.0)
MCH: 29.1 pg (ref 26.0–34.0)
MCHC: 32.6 g/dL (ref 30.0–36.0)
MCV: 89.3 fL (ref 78.0–100.0)
MPV: 11.4 fL (ref 9.4–12.4)
PLATELETS: 319 10*3/uL (ref 150–400)
RBC: 3.92 MIL/uL (ref 3.87–5.11)
RDW: 13.1 % (ref 11.5–15.5)
WBC: 8.1 10*3/uL (ref 4.0–10.5)

## 2014-11-13 LAB — HEMOGLOBIN A1C
Hgb A1c MFr Bld: 5 % (ref <5.7)
Mean Plasma Glucose: 97 mg/dL (ref <117)

## 2014-11-13 LAB — TSH: TSH: 3.799 u[IU]/mL (ref 0.350–4.500)

## 2014-11-14 LAB — IRON: IRON: 62 ug/dL (ref 42–145)

## 2014-11-14 LAB — FERRITIN: FERRITIN: 122 ng/mL (ref 10–291)

## 2014-11-15 ENCOUNTER — Other Ambulatory Visit: Payer: Self-pay

## 2014-11-15 ENCOUNTER — Telehealth: Payer: Self-pay | Admitting: *Deleted

## 2014-11-15 MED ORDER — BENZONATATE 100 MG PO CAPS: 100.0000 mg | ORAL_CAPSULE | Freq: Three times a day (TID) | ORAL | Status: DC | PRN

## 2014-11-15 MED ORDER — LEVOFLOXACIN 500 MG PO TABS: 500.0000 mg | ORAL_TABLET | Freq: Every day | ORAL | Status: DC

## 2014-11-15 NOTE — Telephone Encounter (Signed)
Was here for visit recently Coughing- started Saturday. Coughing up yellow phlegm. Did have sinus congestion but flonase helped that. Just feels very drained with chills and productive cough. Wants something sent to [facility]

## 2014-11-15 NOTE — Telephone Encounter (Signed)
pls erx and let her know levaquin 500 mg daily # 7 only, tessalon perle 100 mg 3 times daily as needed, #30 , advise if worsening call fin , I will order sputum c/s and CXR then

## 2014-11-15 NOTE — Telephone Encounter (Signed)
Pt called stating she seen Dr. Moshe Cipro last week and she now has a cold pt said nothing otc is helping, pt is requesting Dr. Moshe Cipro call something in to [facility]. Pt said she is coughing up yellow stuff, Please advise [phone] if no answer LMOM

## 2014-11-15 NOTE — Telephone Encounter (Signed)
Pt aware and meds sent  

## 2014-12-20 ENCOUNTER — Ambulatory Visit: Payer: BC Managed Care – PPO | Admitting: Family Medicine

## 2014-12-28 ENCOUNTER — Ambulatory Visit: Payer: BC Managed Care – PPO | Admitting: Family Medicine

## 2015-01-12 ENCOUNTER — Ambulatory Visit: Payer: BC Managed Care – PPO | Admitting: Family Medicine

## 2015-01-26 ENCOUNTER — Ambulatory Visit: Payer: BC Managed Care – PPO | Admitting: Family Medicine

## 2015-02-04 NOTE — Assessment & Plan Note (Signed)
Deteriorated. Patient re-educated about  the importance of commitment to a  minimum of 150 minutes of exercise per week. The importance of healthy food choices with portion control discussed. Encouraged to start a food diary, count calories and to consider  joining a support group. Sample diet sheets offered. Goals set by the patient for the next several months.    

## 2015-02-04 NOTE — Assessment & Plan Note (Signed)
Controlled, no change in medication DASH diet and commitment to daily physical activity for a minimum of 30 minutes discussed and encouraged, as a part of hypertension management. The importance of attaining a healthy weight is also discussed.  

## 2015-02-04 NOTE — Assessment & Plan Note (Signed)
Acute onset , uncontrolled Oral course of anti inflammatories, xray and pT

## 2015-02-04 NOTE — Assessment & Plan Note (Signed)
The increased risk of cardiovascular disease associated with this diagnosis, and the need to consistently work on lifestyle to change this is discussed. Following  a  heart healthy diet ,commitment to 30 minutes of exercise at least 5 days per week, as well as control of blood sugar and cholesterol , and achieving a healthy weight are all the areas to be addressed .  

## 2015-02-04 NOTE — Assessment & Plan Note (Signed)
Increased and uncontrolled x 3 weeks, oral ant inflammatories , pT and xrays

## 2015-02-04 NOTE — Assessment & Plan Note (Signed)
Hyperlipidemia:Low fat diet discussed and encouraged.  Updated lab needed at/ before next visit.  

## 2015-02-04 NOTE — Assessment & Plan Note (Signed)
Progressive pain and swelling with instability, x ray to help eval the extent of osteoarthritis Weight loss encouraged

## 2015-03-28 ENCOUNTER — Other Ambulatory Visit: Payer: Self-pay | Admitting: Family Medicine

## 2015-04-04 ENCOUNTER — Ambulatory Visit: Payer: BC Managed Care – PPO | Admitting: Family Medicine

## 2015-06-09 ENCOUNTER — Other Ambulatory Visit: Payer: Self-pay | Admitting: Family Medicine

## 2015-06-09 DIAGNOSIS — Z1231 Encounter for screening mammogram for malignant neoplasm of breast: Secondary | ICD-10-CM

## 2015-07-20 ENCOUNTER — Ambulatory Visit (HOSPITAL_COMMUNITY): Payer: BC Managed Care – PPO

## 2015-07-24 ENCOUNTER — Ambulatory Visit (HOSPITAL_COMMUNITY)
Admission: RE | Admit: 2015-07-24 | Discharge: 2015-07-24 | Disposition: A | Discharge status: Home / Self Care | Payer: BC Managed Care – PPO | Source: Ambulatory Visit | Attending: Family Medicine | Admitting: Family Medicine

## 2015-07-24 DIAGNOSIS — Z1231 Encounter for screening mammogram for malignant neoplasm of breast: Secondary | ICD-10-CM | POA: Diagnosis present

## 2015-08-02 ENCOUNTER — Other Ambulatory Visit: Payer: Self-pay | Admitting: Family Medicine

## 2015-08-05 ENCOUNTER — Other Ambulatory Visit: Payer: Self-pay | Admitting: Family Medicine

## 2015-08-07 ENCOUNTER — Telehealth: Payer: Self-pay | Admitting: *Deleted

## 2015-08-07 NOTE — Telephone Encounter (Signed)
Pt called LMOM stating she needs her rx to be refilled pt said she took her last one Saturday, pt did not state the name of the medication pt said the pharmacy sent a request twice.

## 2015-08-07 NOTE — Telephone Encounter (Signed)
Called patient to find out what med she is in need of a refill for.  Left message.

## 2015-08-07 NOTE — Telephone Encounter (Signed)
Med refilled from electronic refill list.

## 2015-08-22 ENCOUNTER — Ambulatory Visit: Payer: BC Managed Care – PPO | Admitting: Family Medicine

## 2015-11-17 ENCOUNTER — Other Ambulatory Visit: Payer: Self-pay | Admitting: Family Medicine

## 2015-11-20 ENCOUNTER — Other Ambulatory Visit: Payer: Self-pay | Admitting: Family Medicine

## 2015-11-22 ENCOUNTER — Telehealth: Payer: Self-pay | Admitting: Family Medicine

## 2015-11-22 MED ORDER — LABETALOL HCL 200 MG PO TABS
200.0000 mg | ORAL_TABLET | Freq: Every day | ORAL | Status: DC
Start: 2015-11-22 — End: 2015-12-26

## 2015-11-22 NOTE — Telephone Encounter (Signed)
Patient is scheduled Wed Dec 21 at 2:00

## 2015-11-22 NOTE — Telephone Encounter (Signed)
Pt scheduled apt for dec 21st. 1 appt only given to last until visit

## 2015-11-22 NOTE — Telephone Encounter (Signed)
Patient is stating that the pharmacy informed her that her Blood Pressure Medication cant be filled until she sees Dr. Moshe Cipro, she states that due to her new job she cant come in right now and she has been out of her Lorcaserin HCl 10 MG TABS  For 2 days, please advise?

## 2015-11-29 ENCOUNTER — Encounter: Payer: Self-pay | Admitting: Family Medicine

## 2015-11-29 ENCOUNTER — Ambulatory Visit (INDEPENDENT_AMBULATORY_CARE_PROVIDER_SITE_OTHER): Payer: BC Managed Care – PPO | Admitting: Family Medicine

## 2015-11-29 VITALS — BP 126/72 | HR 62 | Resp 18 | Ht 66.0 in | Wt 295.0 lb

## 2015-11-29 DIAGNOSIS — Z1159 Encounter for screening for other viral diseases: Secondary | ICD-10-CM

## 2015-11-29 DIAGNOSIS — G478 Other sleep disorders: Secondary | ICD-10-CM | POA: Diagnosis not present

## 2015-11-29 DIAGNOSIS — G4730 Sleep apnea, unspecified: Secondary | ICD-10-CM

## 2015-11-29 DIAGNOSIS — R5382 Chronic fatigue, unspecified: Secondary | ICD-10-CM

## 2015-11-29 DIAGNOSIS — J309 Allergic rhinitis, unspecified: Secondary | ICD-10-CM

## 2015-11-29 DIAGNOSIS — Z114 Encounter for screening for human immunodeficiency virus [HIV]: Secondary | ICD-10-CM

## 2015-11-29 DIAGNOSIS — R0683 Snoring: Secondary | ICD-10-CM | POA: Diagnosis not present

## 2015-11-29 DIAGNOSIS — E559 Vitamin D deficiency, unspecified: Secondary | ICD-10-CM

## 2015-11-29 DIAGNOSIS — D229 Melanocytic nevi, unspecified: Secondary | ICD-10-CM

## 2015-11-29 DIAGNOSIS — E8881 Metabolic syndrome: Secondary | ICD-10-CM

## 2015-11-29 DIAGNOSIS — E785 Hyperlipidemia, unspecified: Secondary | ICD-10-CM

## 2015-11-29 DIAGNOSIS — I10 Essential (primary) hypertension: Secondary | ICD-10-CM

## 2015-11-29 MED ORDER — PHENTERMINE HCL 37.5 MG PO TBDP: ORAL_TABLET | ORAL | Status: DC

## 2015-11-29 NOTE — Patient Instructions (Signed)
F/u in 4 month, call if you need me before  Fasting labs this week  Info on bariatric surgery  Start disciplined eating  HALF phentermine daily  Weight loss target of 3 to 4 pounds every 4 weeks   Thanks for choosing [facility], we consider it a privelige to serve you.   All the best for 2017!

## 2015-11-29 NOTE — Assessment & Plan Note (Addendum)
Hyperpigmented mole on left hairline increasing in size, wants this removed, referred to dermatology

## 2015-11-29 NOTE — Progress Notes (Signed)
Subjective:    Patient ID: [name], female    DOB: 02-12-1961, 54 y.o.   MRN: [id number]  HPI   [name]     MRN: [id number]      DOB: [birth year]   HPI [name] is here for follow up and re-evaluation of chronic medical conditions, medication management and review of any available recent lab and radiology data.  Preventive health is updated, specifically  Cancer screening and Immunization.   Questions or concerns regarding consultations or procedures which the PT has had in the interim are  addressed. The PT denies any adverse reactions to current medications since the last visit.  There are no new concerns. She is concerned about continued weight gain and wants to reverse this C/o enlarging mole on left hairline  ROS Denies recent fever or chills. Denies sinus pressure, nasal congestion, ear pain or sore throat. Denies chest congestion, productive cough or wheezing. Denies chest pains, palpitations and leg swelling Denies abdominal pain, nausea, vomiting,diarrhea or constipation.   Denies dysuria, frequency, hesitancy or incontinence. Denies joint pain, swelling and limitation in mobility. Denies headaches, seizures, numbness, or tingling. Denies depression, anxiety or insomnia.  PE  BP 126/72 mmHg  Pulse 62  Resp 18  Ht 5\' 6"  (1.676 m)  Wt 295 lb (133.811 kg)  BMI 47.64 kg/m2  SpO2 98%  LMP 10/19/2011  Patient alert and oriented and in no cardiopulmonary distress.  HEENT: No facial asymmetry, EOMI,   oropharynx pink and moist.  Neck supple no JVD, no mass.  Chest: Clear to auscultation bilaterally.  CVS: S1, S2 no murmurs, no S3.Regular rate.  ABD: Soft non tender.   Ext: No edema  MS: Adequate ROM spine, shoulders, hips and knees.  Skin: Intact, no ulcerations or rash noted.Hyperpigmented on left side of face in area of hairline  Psych: Good eye contact, normal affect. Memory intact not anxious or depressed appearing.  CNS: CN 2-12 intact,  power,  normal throughout.no focal deficits noted.   Assessment & Plan  Benign mole Hyperpigmented mole on left hairline increasing in size, wants this removed, referred to dermatology   HTN (hypertension) Controlled, no change in medication DASH diet and commitment to daily physical activity for a minimum of 30 minutes discussed and encouraged, as a part of hypertension management. The importance of attaining a healthy weight is also discussed.  BP/Weight 11/29/2015 11/07/2014 09/15/2014 04/12/2014 01/17/2014 12/08/2013 0000000  Systolic BP 123XX123 123XX123 [id number]  Diastolic BP 72 82 70 82 74 79 74  Wt. (Lbs) 295 292 270 280.12 - 240 283  BMI 47.64 47.15 43.6 45.23 - 38.76 45.7        Allergic rhinitis Controlled, no change in medication   Morbid obesity Deteriorated. Patient re-educated about  the importance of commitment to a  minimum of 150 minutes of exercise per week.  The importance of healthy food choices with portion control discussed. Encouraged to start a food diary, count calories and to consider  joining a support group. Sample diet sheets offered. Goals set by the patient for the next several months.   Weight /BMI 11/29/2015 11/07/2014 09/15/2014  WEIGHT 295 lb 292 lb 270 lb  HEIGHT 5\' 6"  5\' 6"  5\' 6"   BMI 47.64 kg/m2 47.15 kg/m2 43.6 kg/m2    Current exercise per week 60 minutes. Start half phentermine daily Weight watchers recommended strongly and info on bariatric surgery also provided   Sleep-disordered breathing Snoring , excessive fatigue , morbid  obesity, large neck , and hypertension, needs sleep study, likely has sleep apnea. Weight loss encouraged also      Review of Systems     Objective:   Physical Exam        Assessment & Plan:

## 2015-12-07 ENCOUNTER — Other Ambulatory Visit: Payer: Self-pay | Admitting: Family Medicine

## 2015-12-07 LAB — CBC
HEMATOCRIT: 35.2 % — AB (ref 36.0–46.0)
HEMOGLOBIN: 11.6 g/dL — AB (ref 12.0–15.0)
MCH: 29.4 pg (ref 26.0–34.0)
MCHC: 33 g/dL (ref 30.0–36.0)
MCV: 89.1 fL (ref 78.0–100.0)
MPV: 10.8 fL (ref 8.6–12.4)
Platelets: 298 10*3/uL (ref 150–400)
RBC: 3.95 MIL/uL (ref 3.87–5.11)
RDW: 13.3 % (ref 11.5–15.5)
WBC: 5 10*3/uL (ref 4.0–10.5)

## 2015-12-08 LAB — COMPREHENSIVE METABOLIC PANEL
ALBUMIN: 3.9 g/dL (ref 3.6–5.1)
ALK PHOS: 76 U/L (ref 33–130)
ALT: 16 U/L (ref 6–29)
AST: 17 U/L (ref 10–35)
BILIRUBIN TOTAL: 0.3 mg/dL (ref 0.2–1.2)
BUN: 18 mg/dL (ref 7–25)
CALCIUM: 9.3 mg/dL (ref 8.6–10.4)
CO2: 30 mmol/L (ref 20–31)
Chloride: 105 mmol/L (ref 98–110)
Creat: 0.75 mg/dL (ref 0.50–1.05)
Glucose, Bld: 88 mg/dL (ref 65–99)
POTASSIUM: 4.3 mmol/L (ref 3.5–5.3)
Sodium: 143 mmol/L (ref 135–146)
TOTAL PROTEIN: 6.7 g/dL (ref 6.1–8.1)

## 2015-12-08 LAB — LIPID PANEL
CHOLESTEROL: 190 mg/dL (ref 125–200)
HDL: 60 mg/dL (ref >=46)
LDL Cholesterol: 116 mg/dL (ref <130)
TRIGLYCERIDES: 69 mg/dL (ref <150)
Total CHOL/HDL Ratio: 3.2 Ratio (ref <=5.0)
VLDL: 14 mg/dL (ref <30)

## 2015-12-08 LAB — HEMOGLOBIN A1C
HEMOGLOBIN A1C: 5.4 % (ref <5.7)
Mean Plasma Glucose: 108 mg/dL (ref <117)

## 2015-12-08 LAB — TSH: TSH: 2.85 u[IU]/mL (ref 0.350–4.500)

## 2015-12-08 LAB — VITAMIN D 25 HYDROXY (VIT D DEFICIENCY, FRACTURES): VIT D 25 HYDROXY: 26 ng/mL — AB (ref 30–100)

## 2015-12-08 LAB — HEPATITIS C ANTIBODY: HCV Ab: NEGATIVE

## 2015-12-08 LAB — HIV ANTIBODY (ROUTINE TESTING W REFLEX): HIV 1&2 Ab, 4th Generation: NONREACTIVE

## 2015-12-09 LAB — FERRITIN: Ferritin: 145 ng/mL (ref 10–291)

## 2015-12-09 LAB — IRON: Iron: 68 ug/dL (ref 45–160)

## 2015-12-12 ENCOUNTER — Other Ambulatory Visit: Payer: Self-pay

## 2015-12-12 ENCOUNTER — Encounter: Payer: Self-pay | Admitting: Family Medicine

## 2015-12-12 ENCOUNTER — Ambulatory Visit (INDEPENDENT_AMBULATORY_CARE_PROVIDER_SITE_OTHER): Payer: BC Managed Care – PPO | Admitting: Family Medicine

## 2015-12-12 VITALS — BP 146/78 | HR 74 | Resp 18 | Ht 66.0 in | Wt 293.0 lb

## 2015-12-12 DIAGNOSIS — I10 Essential (primary) hypertension: Secondary | ICD-10-CM

## 2015-12-12 DIAGNOSIS — M546 Pain in thoracic spine: Secondary | ICD-10-CM

## 2015-12-12 DIAGNOSIS — R002 Palpitations: Secondary | ICD-10-CM

## 2015-12-12 DIAGNOSIS — R079 Chest pain, unspecified: Secondary | ICD-10-CM

## 2015-12-12 DIAGNOSIS — R55 Syncope and collapse: Secondary | ICD-10-CM | POA: Diagnosis not present

## 2015-12-12 DIAGNOSIS — J3089 Other allergic rhinitis: Secondary | ICD-10-CM

## 2015-12-12 DIAGNOSIS — R42 Dizziness and giddiness: Secondary | ICD-10-CM

## 2015-12-12 DIAGNOSIS — E785 Hyperlipidemia, unspecified: Secondary | ICD-10-CM

## 2015-12-12 MED ORDER — CYCLOBENZAPRINE HCL 10 MG PO TABS: ORAL_TABLET | ORAL | Status: DC

## 2015-12-12 NOTE — Assessment & Plan Note (Signed)
Hyperlipidemia:Low fat diet discussed and encouraged.   Lipid Panel  Lab Results  Component Value Date   CHOL 190 12/07/2015   HDL 60 12/07/2015   LDLCALC 116 12/07/2015   TRIG 69 12/07/2015   CHOLHDL 3.2 12/07/2015

## 2015-12-12 NOTE — Patient Instructions (Signed)
F/u as before, call if you need me sooner  Symptoms of light headedness , are, I believe due to not eating regularly and not eating enough to keep your blood sugar  Within a normal range, THIS IS VERY IMPORTANT    The EKG today shows no sign of heart Damage  Blood pressure today is elevated, please continue diet richnin fruit and vegetable and low in salt, canned foods and sodas  You are referred to cardiologist due to new concerns   You are referred for nutrition education, call re cost if needed, or check Blue Crosss and contact us    Weight watchers is a GREAT support program   NO PHENTERMINE  For upper back pain, goood posture, limit amount of weight that you lift asand use ciorrect teschnique, massages even at home , tylenol 500 mg one to twio daily as needed, and muscle relaxant sent for times when you have  Upper back spasm

## 2015-12-12 NOTE — Assessment & Plan Note (Signed)
Deteriorated. Patient re-educated about  the importance of commitment to a  minimum of 150 minutes of exercise per week.  The importance of healthy food choices with portion control discussed. Encouraged to start a food diary, count calories and to consider  joining a support group. Sample diet sheets offered. Goals set by the patient for the next several months.   Weight /BMI 11/29/2015 11/07/2014 09/15/2014  WEIGHT 295 lb 292 lb 270 lb  HEIGHT 5\' 6"  5\' 6"  5\' 6"   BMI 47.64 kg/m2 47.15 kg/m2 43.6 kg/m2    Current exercise per week 60 minutes. Start half phentermine daily Weight watchers recommended strongly and info on bariatric surgery also provided

## 2015-12-12 NOTE — Assessment & Plan Note (Signed)
Deteriorated. Patient re-educated about  the importance of commitment to a  minimum of 150 minutes of exercise per week.  The importance of healthy food choices with portion control discussed. Encouraged to start a food diary, count calories and to consider  joining a support group. Sample diet sheets offered. Goals set by the patient for the next several months.   Weight /BMI 12/12/2015 11/29/2015 11/07/2014  WEIGHT 293 lb 295 lb 292 lb  HEIGHT 5\' 6"  5\' 6"  5\' 6"   BMI 47.31 kg/m2 47.64 kg/m2 47.15 kg/m2    Recent change in diet may be responsible for light headed episodes as pt is eating much less intentionally

## 2015-12-12 NOTE — Assessment & Plan Note (Signed)
Controlled, no change in medication  

## 2015-12-12 NOTE — Assessment & Plan Note (Signed)
Intermittent generalized chest pain radiating to back associted at times with light headedness twice in i past 2 weeks. EKG in office : NSR, no ischemia or LVH  Doubt cardiac etiology , however, pt has long standing h/o HTN , and severity of symptoms when they do occur are sufficient to warrant cardiology evaluation, referral entered on as soon as possible basis

## 2015-12-12 NOTE — Progress Notes (Signed)
Subjective:    Patient ID: [name], female    DOB: 1961/10/20, 55 y.o.   MRN: [id number]  HPI 2 episodes of felling of light headedness a as though she would pass out, lasting between 30 minutes to 1 hour, first was on 12/02/2015, most recent was last night.Denies feeling of vertigo. 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 Denies sinus pressure, nasal congestion, ear pain or sore throat. Denies chest congestion, productive cough or wheezing. Denies PND, orthopnea, palpitations and leg swelling Denies abdominal pain, nausea, vomiting,diarrhea or constipation.   Denies dysuria, frequency, hesitancy or incontinence. C/O upper back pain, aggravated by movemnt Denies headaches, seizures, numbness, or tingling. Denies depression,  Does have anxiety and c/o poor sleep last night . Denies skin break down or rash.        Objective:   Physical Exam BP 146/78 mmHg  Pulse 74  Resp 18  Ht 5\' 6"  (1.676 m)  Wt 293 lb (132.904 kg)  BMI 47.31 kg/m2  SpO2 100%  LMP 10/19/2011  Patient alert and oriented and in no cardiopulmonary distress.  HEENT: No facial asymmetry, EOMI,   oropharynx pink and moist.  Neck supple no JVD, no mass.  Chest: Clear to auscultation bilaterally.No reproducible chest wall tenderness  CVS: S1, S2 no murmurs, no S3.Regular rate.  ABD: Soft non tender.    Ext: No edema  MS: Adequate ROM spine, shoulders, hips and knees.Tender over thoracic spine  Skin: Intact, no ulcerations or rash noted.  Psych: Good eye contact, normal affect. Memory intact mildly anxious or depressed appearing.  CNS: CN 2-12 intact, power,  normal throughout.no focal deficits noted.         Assessment & Plan:  Light headedness C/o intermittent  light headedness on 2 separate occasions, associated with chest discomfort, and palpitations.  Was recently prescribed phentermine , but did not take this fortunately, and has been advised not to take the medication  Thoracic spine pain Unchanged, pt to use flexeril at bedtime as needed, also tylenol for upper back pain. X ray shows arthritis of the spine. Appropriate back hygiene is also discussed and info provided  Chest pain Intermittent generalized chest pain radiating to back associted at times with light headedness twice in i past 2 weeks. EKG in office : NSR, no ischemia or LVH  Doubt cardiac etiology , however, pt has long standing h/o HTN , and severity of symptoms when they do occur are sufficient to warrant cardiology evaluation, referral entered on as soon as possible basis  Morbid obesity Deteriorated. Patient re-educated about  the importance of commitment to a  minimum of 150 minutes of exercise per week.  The importance of healthy food choices with portion control discussed. Encouraged to start a food diary, count calories and to consider  joining a support group. Sample diet sheets offered. Goals set  by the patient for the next several months.   Weight /BMI 12/12/2015 11/29/2015 11/07/2014  WEIGHT 293 lb 295 lb 292 lb  HEIGHT 5\' 6"  5\' 6"  5\' 6"   BMI 47.31 kg/m2 47.64 kg/m2 47.15 kg/m2    Recent change in diet may be responsible for light headed episodes as pt is eating much less intentionally   HTN (hypertension) UnControlled, no change in medication DASH diet and commitment to daily  physical activity for a minimum of 30 minutes discussed and encouraged, as a part of hypertension management. The importance of attaining a healthy weight is also discussed.  BP/Weight 12/12/2015 11/29/2015 11/07/2014 09/15/2014 04/12/2014 01/17/2014 [id number]  Systolic BP 123456 [id number]  Diastolic BP 78 72 82 70 82 74 79  Wt. (Lbs) 293 295 292 270 280.12 - 240  BMI 47.31 47.64 47.15 43.6 45.23 - 38.76        Dyslipidemia Hyperlipidemia:Low fat diet discussed and encouraged.   Lipid Panel  Lab Results  Component Value Date   CHOL 190 12/07/2015   HDL 60 12/07/2015   LDLCALC 116 12/07/2015   TRIG 69 12/07/2015   CHOLHDL 3.2 12/07/2015         Allergic rhinitis Controlled, no change in medication

## 2015-12-12 NOTE — Assessment & Plan Note (Signed)
Snoring , excessive fatigue , morbid obesity, large neck , and hypertension, needs sleep study, likely has sleep apnea. Weight loss encouraged also

## 2015-12-12 NOTE — Assessment & Plan Note (Signed)
Controlled, no change in medication DASH diet and commitment to daily physical activity for a minimum of 30 minutes discussed and encouraged, as a part of hypertension management. The importance of attaining a healthy weight is also discussed.  BP/Weight 11/29/2015 11/07/2014 09/15/2014 04/12/2014 01/17/2014 12/08/2013 0000000  Systolic BP 123XX123 [id number] 123456  Diastolic BP 72 82 70 82 74 79 74  Wt. (Lbs) 295 292 270 280.12 - 240 283  BMI 47.64 47.15 43.6 45.23 - 38.76 45.7

## 2015-12-12 NOTE — Assessment & Plan Note (Addendum)
C/o intermittent  light headedness on 2 separate occasions, associated with chest discomfort, and palpitations.  Was recently prescribed phentermine , but did not take this fortunately, and has been advised not to take the medication

## 2015-12-12 NOTE — Assessment & Plan Note (Signed)
Unchanged, pt to use flexeril at bedtime as needed, also tylenol for upper back pain. X ray shows arthritis of the spine. Appropriate back hygiene is also discussed and info provided

## 2015-12-12 NOTE — Assessment & Plan Note (Addendum)
UnControlled, no change in medication DASH diet and commitment to daily physical activity for a minimum of 30 minutes discussed and encouraged, as a part of hypertension management. The importance of attaining a healthy weight is also discussed.  BP/Weight 12/12/2015 11/29/2015 11/07/2014 09/15/2014 04/12/2014 01/17/2014 [id number]  Systolic BP 123456 [id number]  Diastolic BP 78 72 82 70 82 74 79  Wt. (Lbs) 293 295 292 270 280.12 - 240  BMI 47.31 47.64 47.15 43.6 45.23 - 38.76

## 2015-12-26 ENCOUNTER — Other Ambulatory Visit: Payer: Self-pay | Admitting: Family Medicine

## 2015-12-27 ENCOUNTER — Encounter: Payer: BC Managed Care – PPO | Admitting: Cardiovascular Disease

## 2015-12-27 NOTE — Progress Notes (Signed)
This encounter was created in error - please disregard.

## 2015-12-28 ENCOUNTER — Encounter: Payer: Self-pay | Admitting: *Deleted

## 2016-02-26 ENCOUNTER — Encounter: Payer: Self-pay | Admitting: Family Medicine

## 2016-02-26 ENCOUNTER — Ambulatory Visit (INDEPENDENT_AMBULATORY_CARE_PROVIDER_SITE_OTHER): Payer: BC Managed Care – PPO | Admitting: Family Medicine

## 2016-02-26 VITALS — BP 144/88 | HR 76 | Resp 16 | Ht 66.0 in | Wt 296.0 lb

## 2016-02-26 DIAGNOSIS — I10 Essential (primary) hypertension: Secondary | ICD-10-CM | POA: Diagnosis not present

## 2016-02-26 DIAGNOSIS — J3089 Other allergic rhinitis: Secondary | ICD-10-CM | POA: Diagnosis not present

## 2016-02-26 DIAGNOSIS — J208 Acute bronchitis due to other specified organisms: Secondary | ICD-10-CM | POA: Diagnosis not present

## 2016-02-26 DIAGNOSIS — J209 Acute bronchitis, unspecified: Secondary | ICD-10-CM | POA: Insufficient documentation

## 2016-02-26 MED ORDER — METHYLPREDNISOLONE ACETATE 80 MG/ML IJ SUSP
80.0000 mg | Freq: Once | INTRAMUSCULAR | Status: AC
  Administered 2016-02-26: 80 mg via INTRAMUSCULAR

## 2016-02-26 MED ORDER — LEVOFLOXACIN 500 MG PO TABS
500.0000 mg | ORAL_TABLET | Freq: Every day | ORAL | Status: DC
Start: 2016-02-26 — End: 2016-07-26

## 2016-02-26 MED ORDER — CEFTRIAXONE SODIUM 500 MG IJ SOLR
500.0000 mg | Freq: Once | INTRAMUSCULAR | Status: AC
  Administered 2016-02-26: 500 mg via INTRAMUSCULAR

## 2016-02-26 MED ORDER — PREDNISONE 5 MG PO TABS: 5.0000 mg | ORAL_TABLET | Freq: Two times a day (BID) | ORAL | Status: AC

## 2016-02-26 NOTE — Assessment & Plan Note (Signed)
Rocephin in office followed by 1 week of levaquin

## 2016-02-26 NOTE — Assessment & Plan Note (Signed)
Uncontrolled, depo medrol in office followed by 5 days of prednisone Commit to daily steroid nasal spray and oral tab, netty pot as needed

## 2016-02-26 NOTE — Patient Instructions (Signed)
F/u in May as before, call if you need me before  You are treated for acute bronchitis and uncontrolled allergy symptoms  Rocephin and depo medrol administered in the office  Levaquin and prednisone sent to your pharmacy  Ensure you take your allergy tablet and use your allergy nasal spray every day also

## 2016-02-27 NOTE — Assessment & Plan Note (Signed)
Uncontrolled and elevated at [facility] visit, has been using an excessive amt of oTC products over past several days No med change DASH diet and commitment to daily physical activity for a minimum of 30 minutes discussed and encouraged, as a part of hypertension management. The importance of attaining a healthy weight is also discussed.  BP/Weight 02/26/2016 12/12/2015 11/29/2015 11/07/2014 09/15/2014 123XX123 [id number]  Systolic BP 123456 123456 [id number]  Diastolic BP 88 78 72 82 70 82 74  Wt. (Lbs) 296 293 295 292 270 280.12 -  BMI 47.8 47.31 47.64 47.15 43.6 45.23 -

## 2016-02-27 NOTE — Progress Notes (Signed)
   Subjective:    Patient ID: [name], female    DOB: Jul 08, 1961, 55 y.o.   MRN: [id number]  HPI  2 week h/o worsening head and chest congestion, associated with fever and chills intermittently. Sputum is thick, head congestion and sinus pressure have decreasd, and symptoms have "settled in her chest" C/o bilateral ear pressure, denies hearing loss and sore throat. Increasing fatigue , poor appetitie and sleep disturbed by cough. No improvement with OTC medication.   Review of Systems See HPI  Denies chest pains, palpitations and leg swelling Denies abdominal pain, nausea, vomiting,diarrhea or constipation.   Denies dysuria, frequency, hesitancy or incontinence. Denies joint pain, swelling and limitation in mobility. Denies headaches, seizures, numbness, or tingling. Denies depression, anxiety or insomnia. Denies skin break down or rash.        Objective:   Physical Exam  BP 144/88 mmHg  Pulse 76  Resp 16  Ht 5\' 6"  (1.676 m)  Wt 296 lb (134.265 kg)  BMI 47.80 kg/m2  SpO2 98%  LMP 10/19/2011 Patient alert and oriented and in no cardiopulmonary distress.  HEENT: No facial asymmetry, EOMI,   oropharynx pink and moist.  Neck supple no JVD, no mass. TM clear bilaterally, eryhtema and edema of nasal mucosa, no sinus tenderness Chest: adequate air entry, scattered crackles, no wheezesCVS: S1, S2 no murmurs, no S3.Regular rate.  ABD: Soft non tender.   Ext: No edema  MS: Adequate ROM spine, shoulders, hips and knees.  Skin: Intact, no ulcerations or rash noted.  Psych: Good eye contact, normal affect. Memory intact not anxious or depressed appearing.  CNS: CN 2-12 intact, power,  normal throughout.no focal deficits noted.       Assessment & Plan:  Acute bronchitis Rocephin in office followed by 1 week of levaquin  Allergic rhinitis Uncontrolled, depo medrol in office followed by 5 days of prednisone Commit to daily steroid nasal spray and oral tab, netty  pot as needed  HTN (hypertension) Uncontrolled and elevated at thsi visit, has been using an excessive amt of oTC products over past several days No med change DASH diet and commitment to daily physical activity for a minimum of 30 minutes discussed and encouraged, as a part of hypertension management. The importance of attaining a healthy weight is also discussed.  BP/Weight 02/26/2016 12/12/2015 11/29/2015 11/07/2014 09/15/2014 123XX123 [id number]  Systolic BP 123456 123456 [id number]  Diastolic BP 88 78 72 82 70 82 74  Wt. (Lbs) 296 293 295 292 270 280.12 -  BMI 47.8 47.31 47.64 47.15 43.6 45.23 -        Morbid obesity Deteriorated. Patient re-educated about  the importance of commitment to a  minimum of 150 minutes of exercise per week.  The importance of healthy food choices with portion control discussed. Encouraged to start a food diary, count calories and to consider  joining a support group. Sample diet sheets offered. Goals set by the patient for the next several months.   Weight /BMI 02/26/2016 12/12/2015 11/29/2015  WEIGHT 296 lb 293 lb 295 lb  HEIGHT 5\' 6"  5\' 6"  5\' 6"   BMI 47.8 kg/m2 47.31 kg/m2 47.64 kg/m2

## 2016-02-27 NOTE — Assessment & Plan Note (Signed)
Deteriorated. Patient re-educated about  the importance of commitment to a  minimum of 150 minutes of exercise per week.  The importance of healthy food choices with portion control discussed. Encouraged to start a food diary, count calories and to consider  joining a support group. Sample diet sheets offered. Goals set by the patient for the next several months.   Weight /BMI 02/26/2016 12/12/2015 11/29/2015  WEIGHT 296 lb 293 lb 295 lb  HEIGHT 5\' 6"  5\' 6"  5\' 6"   BMI 47.8 kg/m2 47.31 kg/m2 47.64 kg/m2

## 2016-03-08 ENCOUNTER — Ambulatory Visit: Payer: BC Managed Care – PPO | Admitting: Cardiovascular Disease

## 2016-03-29 ENCOUNTER — Other Ambulatory Visit: Payer: Self-pay | Admitting: Family Medicine

## 2016-04-05 ENCOUNTER — Ambulatory Visit: Payer: BC Managed Care – PPO | Admitting: Cardiovascular Disease

## 2016-04-09 ENCOUNTER — Ambulatory Visit: Payer: BC Managed Care – PPO | Admitting: Family Medicine

## 2016-05-20 ENCOUNTER — Ambulatory Visit: Payer: BC Managed Care – PPO | Admitting: Cardiovascular Disease

## 2016-05-20 ENCOUNTER — Encounter: Payer: Self-pay | Admitting: *Deleted

## 2016-06-05 ENCOUNTER — Other Ambulatory Visit: Payer: Self-pay | Admitting: Family Medicine

## 2016-06-28 ENCOUNTER — Other Ambulatory Visit: Payer: Self-pay | Admitting: Family Medicine

## 2016-06-28 DIAGNOSIS — Z1231 Encounter for screening mammogram for malignant neoplasm of breast: Secondary | ICD-10-CM

## 2016-07-22 ENCOUNTER — Ambulatory Visit (HOSPITAL_COMMUNITY)
Admission: RE | Admit: 2016-07-22 | Discharge: 2016-07-22 | Disposition: A | Discharge status: Home / Self Care | Payer: BC Managed Care – PPO | Source: Ambulatory Visit | Attending: Family Medicine | Admitting: Family Medicine

## 2016-07-22 ENCOUNTER — Ambulatory Visit (HOSPITAL_COMMUNITY): Payer: BC Managed Care – PPO

## 2016-07-22 DIAGNOSIS — Z1231 Encounter for screening mammogram for malignant neoplasm of breast: Secondary | ICD-10-CM | POA: Diagnosis not present

## 2016-07-26 ENCOUNTER — Other Ambulatory Visit: Payer: Self-pay | Admitting: Family Medicine

## 2016-08-05 ENCOUNTER — Ambulatory Visit: Payer: BC Managed Care – PPO | Admitting: Cardiovascular Disease

## 2016-09-27 ENCOUNTER — Other Ambulatory Visit: Payer: Self-pay

## 2016-09-27 MED ORDER — LABETALOL HCL 200 MG PO TABS: 200.0000 mg | ORAL_TABLET | Freq: Every day | ORAL | 0 refills | Status: DC

## 2016-10-08 ENCOUNTER — Ambulatory Visit: Payer: BC Managed Care – PPO | Admitting: Family Medicine

## 2016-10-16 ENCOUNTER — Ambulatory Visit: Payer: BC Managed Care – PPO | Admitting: Family Medicine

## 2016-10-21 ENCOUNTER — Ambulatory Visit (INDEPENDENT_AMBULATORY_CARE_PROVIDER_SITE_OTHER): Payer: BC Managed Care – PPO | Admitting: Family Medicine

## 2016-10-21 ENCOUNTER — Encounter: Payer: Self-pay | Admitting: Family Medicine

## 2016-10-21 VITALS — BP 126/80 | HR 68 | Resp 18 | Ht 66.0 in | Wt 302.0 lb

## 2016-10-21 DIAGNOSIS — E785 Hyperlipidemia, unspecified: Secondary | ICD-10-CM | POA: Diagnosis not present

## 2016-10-21 DIAGNOSIS — I10 Essential (primary) hypertension: Secondary | ICD-10-CM | POA: Diagnosis not present

## 2016-10-21 NOTE — Patient Instructions (Signed)
F/u in 5 month, call if you need me before  Fasting labs as soon as possible  Please consider bariatric surgery for weigt management  I will let you know about the referral if accepted  Thank you  for choosing  [facility]. We consider it a privelige to serve you.  Delivering excellent health care in a caring and  compassionate way is our goal.  Partnering with you,  so that together we can achieve this goal is our strategy.

## 2016-10-21 NOTE — Assessment & Plan Note (Signed)
Controlled, no change in medication DASH diet and commitment to daily physical activity for a minimum of 30 minutes discussed and encouraged, as a part of hypertension management. The importance of attaining a healthy weight is also discussed.  BP/Weight 10/21/2016 02/26/2016 12/12/2015 11/29/2015 11/07/2014 [id number]  Systolic BP 123XX123 123456 123456 [id number]  Diastolic BP 80 88 78 72 82 70 82  Wt. (Lbs) 302 296 293 295 292 270 280.12  BMI 48.74 47.8 47.31 47.64 47.15 43.6 45.23

## 2016-10-21 NOTE — Progress Notes (Signed)
   [name]     MRN: [id number]      DOB: July 14, 1961   HPI [name] is here for follow up and re-evaluation of chronic medical conditions, medication management and review of any available recent lab and radiology data.  Preventive health is updated, specifically  Cancer screening and Immunization.   Questions or concerns regarding consultations or procedures which the PT has had in the interim are  addressed. The PT denies any adverse reactions to current medications since the last visit.  There are no new concerns.  There are no specific complaints   ROS Denies recent fever or chills. Denies sinus pressure, nasal congestion, ear pain or sore throat. Denies chest congestion, productive cough or wheezing. Denies chest pains, palpitations and leg swelling Denies abdominal pain, nausea, vomiting,diarrhea or constipation.   Denies dysuria, frequency, hesitancy or incontinence. Denies joint pain, swelling and limitation in mobility. Denies headaches, seizures, numbness, or tingling. Denies depression, anxiety or insomnia. Denies skin break down or rash.   PE  BP 126/80   Pulse 68   Resp 18   Ht 5\' 6"  (1.676 m)   Wt (!) 302 lb (137 kg)   LMP 10/19/2011   SpO2 97%   BMI 48.74 kg/m   Patient alert and oriented and in no cardiopulmonary distress.  HEENT: No facial asymmetry, EOMI,   oropharynx pink and moist.  Neck supple no JVD, no mass.  Chest: Clear to auscultation bilaterally.  CVS: S1, S2 no murmurs, no S3.Regular rate.  ABD: Soft non tender.   Ext: No edema  MS: Adequate ROM spine, shoulders, hips and knees.  Skin: Intact, no ulcerations or rash noted.  Psych: Good eye contact, normal affect. Memory intact not anxious or depressed appearing.  CNS: CN 2-12 intact, power,  normal throughout.no focal deficits noted.   Assessment & Plan  HTN (hypertension) Controlled, no change in medication DASH diet and commitment to daily physical activity for a minimum  of 30 minutes discussed and encouraged, as a part of hypertension management. The importance of attaining a healthy weight is also discussed.  BP/Weight 10/21/2016 02/26/2016 12/12/2015 11/29/2015 11/07/2014 [id number]  Systolic BP 123XX123 123456 123456 [id number] [id number]  Diastolic BP 80 88 78 72 82 70 82  Wt. (Lbs) 302 296 293 295 292 270 280.12  BMI 48.74 47.8 47.31 47.64 47.15 43.6 45.23       Morbid obesity Deteriorated. Patient re-educated about  the importance of commitment to a  minimum of 150 minutes of exercise per week.  The importance of healthy food choices with portion control discussed. Encouraged to start a food diary, count calories and to consider  joining a support group. Sample diet sheets offered. Goals set by the patient for the next several months.   Weight /BMI 10/21/2016 02/26/2016 12/12/2015  WEIGHT 302 lb 296 lb 293 lb  HEIGHT 5\' 6"  5\' 6"  5\' 6"   BMI 48.74 kg/m2 47.8 kg/m2 47.31 kg/m2    Considering endo referral  Dyslipidemia Hyperlipidemia:Low fat diet discussed and encouraged.   Lipid Panel  Lab Results  Component Value Date   CHOL 190 12/07/2015   HDL 60 12/07/2015   LDLCALC 116 12/07/2015   TRIG 69 12/07/2015   CHOLHDL 3.2 12/07/2015   Goal LDL of 100 or less, needs to follow low fat diet

## 2016-10-21 NOTE — Assessment & Plan Note (Signed)
Deteriorated. Patient re-educated about  the importance of commitment to a  minimum of 150 minutes of exercise per week.  The importance of healthy food choices with portion control discussed. Encouraged to start a food diary, count calories and to consider  joining a support group. Sample diet sheets offered. Goals set by the patient for the next several months.   Weight /BMI 10/21/2016 02/26/2016 12/12/2015  WEIGHT 302 lb 296 lb 293 lb  HEIGHT 5\' 6"  5\' 6"  5\' 6"   BMI 48.74 kg/m2 47.8 kg/m2 47.31 kg/m2    Considering endo referral

## 2016-10-21 NOTE — Assessment & Plan Note (Signed)
Hyperlipidemia:Low fat diet discussed and encouraged.   Lipid Panel  Lab Results  Component Value Date   CHOL 190 12/07/2015   HDL 60 12/07/2015   LDLCALC 116 12/07/2015   TRIG 69 12/07/2015   CHOLHDL 3.2 12/07/2015   Goal LDL of 100 or less, needs to follow low fat diet

## 2016-10-28 ENCOUNTER — Other Ambulatory Visit: Payer: Self-pay | Admitting: Family Medicine

## 2016-10-29 ENCOUNTER — Other Ambulatory Visit: Payer: Self-pay

## 2016-10-29 MED ORDER — LABETALOL HCL 200 MG PO TABS: 200.0000 mg | ORAL_TABLET | Freq: Every day | ORAL | 5 refills | Status: DC

## 2016-11-04 ENCOUNTER — Ambulatory Visit (INDEPENDENT_AMBULATORY_CARE_PROVIDER_SITE_OTHER): Payer: BC Managed Care – PPO | Admitting: Family Medicine

## 2016-11-04 ENCOUNTER — Encounter: Payer: Self-pay | Admitting: Family Medicine

## 2016-11-04 VITALS — BP 128/78 | HR 76 | Temp 98.8°F | Resp 20 | Ht 66.0 in | Wt 297.1 lb

## 2016-11-04 DIAGNOSIS — J0140 Acute pansinusitis, unspecified: Secondary | ICD-10-CM | POA: Diagnosis not present

## 2016-11-04 MED ORDER — CEFTRIAXONE SODIUM 500 MG IJ SOLR
500.0000 mg | Freq: Once | INTRAMUSCULAR | Status: AC
  Administered 2016-11-04: 500 mg via INTRAMUSCULAR

## 2016-11-04 MED ORDER — PREDNISONE 20 MG PO TABS: 20.0000 mg | ORAL_TABLET | Freq: Two times a day (BID) | ORAL | 0 refills | Status: DC

## 2016-11-04 MED ORDER — AMOXICILLIN 875 MG PO TABS: 875.0000 mg | ORAL_TABLET | Freq: Two times a day (BID) | ORAL | 0 refills | Status: DC

## 2016-11-04 NOTE — Patient Instructions (Signed)
DRINK PLENTY OF WATER CONTINUE THE NETI POT  TAKE THE AMOXIL FOR 10 DAYS TAKE THE PREDNISONE FOR 5 DAYS  CALL IF NOT IMPROVING IN  3 DAYS

## 2016-11-04 NOTE — Progress Notes (Signed)
Subjective:     [name] is a 55 y.o. female who presents for evaluation of symptoms of a URI, possible sinusitis. Symptoms include congestion, facial pain, nasal congestion, non productive cough, post nasal drip, sinus pressure and sore throat. Onset of symptoms was 5 days ago, and has been gradually worsening since that time. Treatment to date: none.  The following portions of the patient's history were reviewed and updated as appropriate: past family history, past medical history, past social history and past surgical history.  Review of Systems Pertinent items are noted in HPI.   Objective:    BP 128/78 (BP Location: Right Arm, Patient Position: Sitting, Cuff Size: Large)   Pulse 76   Temp 98.8 F (37.1 C) (Oral)   Resp 20   Ht 5\' 6"  (1.676 m)   Wt 297 lb 1.3 oz (134.8 kg)   LMP 10/19/2011   SpO2 99%   BMI 47.95 kg/m  General appearance: alert, appears stated age and mild distress Head: Normocephalic, without obvious abnormality, atraumatic Eyes: negative findings: conjunctivae and sclerae normal and corneas clear Ears: normal TM's and external ear canals both ears Nose: moderate congestion, sinus tenderness bilateral Throat: lips, mucosa, and tongue normal; teeth and gums normal Neck: no adenopathy, no carotid bruit and thyroid not enlarged, symmetric, no tenderness/mass/nodules Back: symmetric, no curvature. ROM normal. No CVA tenderness. Lungs: clear to auscultation bilaterally Heart: regular rate and rhythm, S1, S2 normal, no murmur, click, rub or gallop Abdomen: normal findings: bowel sounds normal and soft, non-tender Extremities: extremities normal, atraumatic, no cyanosis or edema   Assessment:    viral upper respiratory illness   Recurrent sinusitis  Plan:   Patient Instructions  DRINK PLENTY OF WATER CONTINUE THE NETI POT  TAKE THE AMOXIL FOR 10 DAYS TAKE THE PREDNISONE FOR 5 DAYS  CALL IF NOT IMPROVING IN  3 DAYS     Discussed diagnosis and  treatment of URI. Suggested symptomatic OTC remedies. Nasal saline spray for congestion. Follow up as needed.

## 2016-11-12 ENCOUNTER — Encounter: Payer: Self-pay | Admitting: Cardiology

## 2016-11-12 ENCOUNTER — Ambulatory Visit (INDEPENDENT_AMBULATORY_CARE_PROVIDER_SITE_OTHER): Payer: BC Managed Care – PPO | Admitting: Cardiology

## 2016-11-12 VITALS — BP 148/88 | HR 81 | Ht 66.0 in | Wt 306.4 lb

## 2016-11-12 DIAGNOSIS — R0789 Other chest pain: Secondary | ICD-10-CM | POA: Diagnosis not present

## 2016-11-12 DIAGNOSIS — R55 Syncope and collapse: Secondary | ICD-10-CM

## 2016-11-12 DIAGNOSIS — J22 Unspecified acute lower respiratory infection: Secondary | ICD-10-CM

## 2016-11-12 DIAGNOSIS — R000 Tachycardia, unspecified: Secondary | ICD-10-CM | POA: Diagnosis not present

## 2016-11-12 DIAGNOSIS — I10 Essential (primary) hypertension: Secondary | ICD-10-CM

## 2016-11-12 NOTE — Progress Notes (Signed)
Cardiology Office Note  NEW PATIENT VISIT   Date:  11/12/2016   ID:  [name], DOB 1961/07/14, MRN [id number]  PCP:  [name], MD  Cardiologist:  New Dr. Marlou Porch    Chief Complaint  Patient presents with  . Near Syncope    this was in Jan 2017      History of Present Illness: [name] is a 55 y.o. female who presents for near syncope from [place]. 2017 and chest pain since then as well.  She has a hx of HTN, morbid obesity.    LDL in 2016 was 116.  She did have a cold at that time.     Today she is completing therapy for URI and did receive steroids.  Last night she felt fluttering in her chest and felt she would pass out.  This is second episode one last year.  Also some upper back discomfort and does have arthritis.  Mild chest discomfort.  We discussed her weight issues.  She would like to lose wt but does not have a plan though she is thinking of gastric sleeve.    Past Medical History:  Diagnosis Date  . Hypertension   . Seasonal allergies     Past Surgical History:  Procedure Laterality Date  . COLONOSCOPY N/A 09/15/2014   Procedure: COLONOSCOPY;  Surgeon: Rogene Houston, MD;  Location: [facility];  Service: Endoscopy;  Laterality: N/A;  830  . Cyst removed from buttock  1980's     Current Outpatient Prescriptions  Medication Sig Dispense Refill  . fish oil-omega-3 fatty acids 1000 MG capsule Take 1 g by mouth daily.    Marland Kitchen labetalol (NORMODYNE) 200 MG tablet Take 1 tablet (200 mg total) by mouth daily. 30 tablet 5  . loratadine (CLARITIN) 10 MG tablet Take 10 mg by mouth daily.    . Multiple Vitamin (MULTIVITAMIN) tablet Take 1 tablet by mouth daily.      . vitamin C (ASCORBIC ACID) 500 MG tablet Take 500 mg by mouth daily.     No current facility-administered medications for this visit.     Allergies:   Sulfamethoxazole-trimethoprim    Social History:  The patient  reports that she has never smoked. She has never used smokeless tobacco.  She reports that she does not drink alcohol or use drugs.   Family History:  The patient's family history includes Diabetes in her mother.    ROS:  General:no colds or fevers, + weight increase  Skin:no rashes or ulcers HEENT:no blurred vision, no congestion CV:see HPI PUL:see HPI GI:no diarrhea constipation or melena, no indigestion GU:no hematuria, no dysuria MS:no joint pain, no claudication Neuro:no syncope, no lightheadedness Endo:no diabetes, no thyroid disease  Wt Readings from Last 3 Encounters:  11/12/16 (!) 306 lb 6.4 oz (139 kg)  11/04/16 297 lb 1.3 oz (134.8 kg)  10/21/16 (!) 302 lb (137 kg)     PHYSICAL EXAM: VS:  BP (!) 148/88   Pulse 81   Ht 5\' 6"  (1.676 m)   Wt (!) 306 lb 6.4 oz (139 kg)   LMP 10/19/2011   BMI 49.45 kg/m  , BMI Body mass index is 49.45 kg/m. General:Pleasant affect, NAD Skin:Warm and dry, brisk capillary refill HEENT:normocephalic, sclera clear, mucus membranes moist Neck:supple, no JVD, no bruits  Heart:S1S2 RRR without murmur, gallup, rub or click Lungs:clear without rales, rhonchi, or wheezes [id number], non tender, + BS, do not palpate liver spleen or masses Ext:no lower ext edema,  2+ pedal pulses, 2+ radial pulses Neuro:alert and oriented, MAE, follows commands, + facial symmetry    EKG:  EKG is ordered today. The ekg ordered today demonstrates SR normal EKG    Recent Labs: 12/07/2015: ALT 16; BUN 18; Creat 0.75; Hemoglobin 11.6; Platelets 298; Potassium 4.3; Sodium 143; TSH 2.850    Lipid Panel    Component Value Date/Time   CHOL 190 12/07/2015 0924   TRIG 69 12/07/2015 0924   HDL 60 12/07/2015 0924   CHOLHDL 3.2 12/07/2015 0924   VLDL 14 12/07/2015 0924   LDLCALC 116 12/07/2015 0924       Other studies Reviewed: Additional studies/ records that were reviewed today include: PCP OV notes   ASSESSMENT AND PLAN:  1.  Near syncope with fluttering, will ask her to wear 30 day event monitor.  Follow up 5-6 weeks for  results. Though if further problems to call.  2.  HTN will check echo.  All may be due to her URI infections with both episodes but wil evaluate.  The chest pain is most likely related to her cold symptoms.  Though is Echo has WMA would do stress test. EKG is normal.   Discussed with Dr. Marlou Porch she will follow up with me on a day Dr. Marlou Porch is here ro with Dr. Marlou Porch.     Current medicines are reviewed with the patient today.  The patient Has no concerns regarding medicines.  The following changes have been made:  See above Labs/ tests ordered today include:see above  Disposition:   FU:  see above  Signed, Cecilie Kicks, NP  11/12/2016 3:24 PM    [facility] [address], [place], [place], [place] Phone: [phone]; Fax: [phone]

## 2016-11-12 NOTE — Patient Instructions (Addendum)
Medication Instructions:  Your physician recommends that you continue on your current medications as directed. Please refer to the Current Medication list given to you today.  Labwork: NONE  Testing/Procedures: Your physician has requested that you have an echocardiogram. Echocardiography is a painless test that uses sound waves to create images of your heart. It provides your doctor with information about the size and shape of your heart and how well your heart's chambers and valves are working. This procedure takes approximately one hour. There are no restrictions for this procedure.  Your physician has recommended that you wear an 30 day event monitor. Event monitors are medical devices that record the heart's electrical activity. Doctors most often [place] these monitors to diagnose arrhythmias. Arrhythmias are problems with the speed or rhythm of the heartbeat. The monitor is a small, portable device. You can wear one while you do your normal daily activities. This is usually used to diagnose what is causing palpitations/syncope (passing out).  Follow-Up: Your physician wants you to follow-up in: 6 weeks with Cecilie Kicks NP when Dr. Marlou Porch is in the office.    If you need a refill on your cardiac medications before your next appointment, please call your pharmacy.

## 2016-11-14 ENCOUNTER — Ambulatory Visit (HOSPITAL_COMMUNITY)
Admission: RE | Admit: 2016-11-14 | Discharge: 2016-11-14 | Disposition: A | Discharge status: Home / Self Care | Payer: BC Managed Care – PPO | Source: Ambulatory Visit | Attending: Cardiology | Admitting: Cardiology

## 2016-11-14 DIAGNOSIS — E785 Hyperlipidemia, unspecified: Secondary | ICD-10-CM | POA: Diagnosis not present

## 2016-11-14 DIAGNOSIS — I10 Essential (primary) hypertension: Secondary | ICD-10-CM | POA: Insufficient documentation

## 2016-11-14 DIAGNOSIS — K219 Gastro-esophageal reflux disease without esophagitis: Secondary | ICD-10-CM | POA: Diagnosis not present

## 2016-11-14 DIAGNOSIS — R000 Tachycardia, unspecified: Secondary | ICD-10-CM | POA: Diagnosis not present

## 2016-11-14 DIAGNOSIS — I340 Nonrheumatic mitral (valve) insufficiency: Secondary | ICD-10-CM | POA: Insufficient documentation

## 2016-11-14 LAB — ECHOCARDIOGRAM COMPLETE
AO mean calculated velocity dopler: 117 cm/s
AOPV: 0.72 m/s
AOVTI: 43.8 cm
AV Area VTI index: 0.96 cm2/m2
AV Area VTI: 2.49 cm2
AV Mean grad: 7 mmHg
AV Peak grad: 14 mmHg
AV peak Index: 0.95
AV pk vel: 189 cm/s
AV vel: 2.52
AVAREAMEANV: 2.59 cm2
AVAREAMEANVIN: 0.99 cm2/m2
AVCELMEANRAT: 0.75
CHL CUP AV VALUE AREA INDEX: 0.96
CHL CUP STROKE VOLUME: 52 mL
E decel time: 246 msec
E/e' ratio: 11.41
FS: 40 % (ref 28–44)
IVS/LV PW RATIO, ED: 0.86
LA ID, A-P, ES: 45 mm
LA vol index: 29.2 mL/m2
LA vol: 76.7 mL
LADIAMINDEX: 1.71 cm/m2
LAVOLA4C: 91.8 mL
LDCA: 3.46 cm2
LEFT ATRIUM END SYS DIAM: 45 mm
LV E/e' medial: 11.41
LV E/e'average: 11.41
LV PW d: 9.61 mm — AB (ref 0.6–1.1)
LV SIMPSON'S DISK: 62
LV dias vol index: 32 mL/m2
LV e' LATERAL: 9.9 cm/s
LV sys vol: 33 mL (ref 14–42)
LVDIAVOL: 85 mL (ref 46–106)
LVOT SV: 110 mL
LVOT VTI: 31.9 cm
LVOT diameter: 21 mm
LVOT peak grad rest: 7 mmHg
LVOTPV: 136 cm/s
LVOTVTI: 0.73 cm
LVSYSVOLIN: 12 mL/m2
MV Dec: 246
MVPG: 5 mmHg
MVPKAVEL: 98.5 m/s
MVPKEVEL: 113 m/s
RV LATERAL S' VELOCITY: 17.2 cm/s
TAPSE: 32.3 mm
TDI e' lateral: 9.9
TDI e' medial: 9.57
Valve area: 2.52 cm2

## 2016-11-14 NOTE — Progress Notes (Signed)
*  PRELIMINARY RESULTS* Echocardiogram 2D Echocardiogram has been performed.  [name] 11/14/2016, 11:20 AM

## 2016-11-20 ENCOUNTER — Encounter: Payer: Self-pay | Admitting: *Deleted

## 2016-11-20 NOTE — Progress Notes (Signed)
Patient ID: [name], female   DOB: 30-Aug-1961, 55 y.o.   MRN: [id number] Patient did not show up for 11/20/16, 4:00 PM, appointment to have a  30 day cardiac event monitor applied.  Patient has a follow up appointment schedule with Dr. Marlou Porch 12/27/16.

## 2016-12-18 ENCOUNTER — Ambulatory Visit (INDEPENDENT_AMBULATORY_CARE_PROVIDER_SITE_OTHER): Payer: BC Managed Care – PPO

## 2016-12-18 DIAGNOSIS — R000 Tachycardia, unspecified: Secondary | ICD-10-CM

## 2016-12-27 ENCOUNTER — Ambulatory Visit: Payer: BC Managed Care – PPO | Admitting: Cardiology

## 2016-12-30 ENCOUNTER — Telehealth: Payer: Self-pay | Admitting: Cardiology

## 2016-12-30 NOTE — Telephone Encounter (Signed)
Received tracing from [employer] that was actually  recorded on 12/24/16 at 12:57 PM (CT).- this has been reviewed by Dr Burt Knack : Short run of NSVT, asymptomatic, NL LV fxn, continue to monitor and keep FU. Recent K+ 4.3  I spoke with patient and she denies any symptoms at the time of this tracing on 12/24/16 and denies any symptoms at this time.  Pt advised to continue to monitor, activate monitor if any symptoms, keep follow up scheduled in our office. Pt states she has been told by [employer] that she [place] not [place] Darrick Penna has spoken with [employer] and they are reaching out to patient to help with monitor issues.

## 2016-12-30 NOTE — Telephone Encounter (Signed)
New Message    Critical EKG results

## 2016-12-30 NOTE — Telephone Encounter (Signed)
Patient was contacted by [employer] trouble shouting team.  Patient did not have the cell phone on which is why a light was flashing on the monitor.  If the patient does not have the phone on, does not charge the phone, or does not have the phone within 10 feet of her, her recording will not be transmitted timely to [employer].  The delay in transmission is more likely to to one of the above than being in  poor cell phone tower area. When telephoning patient, she stated, she was told by [employer], once she takes a monitor off the charger and snap it onto her chest, she does not need to push the button to start it.  This is not correct.  I attempted to explain this to patient but she was in the middle of class and could not continue conversation.  I contacted Evelena Asa, [employer] representative and asked him to have [employer] contact patient again (later today, since she is in class), and review how to correctly change out low battery monitor with newly charged monitor, which, is also in instruction manual and on instruction video available on accompanied smart phone.

## 2016-12-30 NOTE — Telephone Encounter (Signed)
I spoke with [name]. Auto detection received by [employer] at 8:17 Central Time, SR with 8 beats VT, rate 219, then back to SR, rate 74 BMP.  [name] states they attempted to contact pt but were not able to get in touch with pt, do not know if pt was symptomatic.

## 2016-12-30 NOTE — Telephone Encounter (Signed)
Pt states since she changed battery around 7AM this morning she does not feel the monitor has been working, pt states the pause button continues to blink.  Pt denies any symptoms around 9:17 our time and states she does not think monitor was working at that time.

## 2017-01-16 ENCOUNTER — Ambulatory Visit (INDEPENDENT_AMBULATORY_CARE_PROVIDER_SITE_OTHER): Payer: BC Managed Care – PPO | Admitting: Family Medicine

## 2017-01-16 ENCOUNTER — Encounter: Payer: Self-pay | Admitting: Family Medicine

## 2017-01-16 VITALS — BP 122/80 | HR 80 | Temp 98.8°F | Resp 16 | Ht 66.0 in | Wt 301.0 lb

## 2017-01-16 DIAGNOSIS — J209 Acute bronchitis, unspecified: Secondary | ICD-10-CM

## 2017-01-16 DIAGNOSIS — J0100 Acute maxillary sinusitis, unspecified: Secondary | ICD-10-CM | POA: Diagnosis not present

## 2017-01-16 DIAGNOSIS — I10 Essential (primary) hypertension: Secondary | ICD-10-CM

## 2017-01-16 MED ORDER — BENZONATATE 100 MG PO CAPS: 100.0000 mg | ORAL_CAPSULE | Freq: Three times a day (TID) | ORAL | 0 refills | Status: DC | PRN

## 2017-01-16 MED ORDER — ALBUTEROL SULFATE HFA 108 (90 BASE) MCG/ACT IN AERS: 2.0000 | INHALATION_SPRAY | Freq: Four times a day (QID) | RESPIRATORY_TRACT | 0 refills | Status: DC | PRN

## 2017-01-16 MED ORDER — LEVOFLOXACIN 500 MG PO TABS: 500.0000 mg | ORAL_TABLET | Freq: Every day | ORAL | 0 refills | Status: DC

## 2017-01-16 MED ORDER — PREDNISONE 5 MG PO TABS: 5.0000 mg | ORAL_TABLET | Freq: Two times a day (BID) | ORAL | 0 refills | Status: AC

## 2017-01-16 MED ORDER — FLUCONAZOLE 150 MG PO TABS: ORAL_TABLET | ORAL | 0 refills | Status: DC

## 2017-01-16 NOTE — Patient Instructions (Addendum)
F/u in 6 months, call if you need me before  You are treated for acute sinusitis, and bronchitis, please get  fasting labs first week in March  It is important that you exercise regularly at least 30 minutes 5 times a week. If you develop chest pain, have severe difficulty breathing, or feel very tired, stop exercising immediately and seek medical attention    Please work on good  health habits so that your health will improve. 1. Commitment to daily physical activity for 30 to 60  minutes, if you are able to do this.  2. Commitment to wise food choices. Aim for half of your  food intake to be vegetable and fruit, one quarter starchy foods, and one quarter protein. Try to eat on a regular schedule  3 meals per day, snacking between meals should be limited to vegetables or fruits or small portions of nuts. 64 ounces of water per day is generally recommended, unless you have specific health conditions, like heart failure or kidney failure where you will need to limit fluid intake.  3. Commitment to sufficient and a  good quality of physical and mental rest daily, generally between 6 to 8 hours per day.  WITH PERSISTANCE AND PERSEVERANCE, THE IMPOSSIBLE , BECOMES THE NORM!

## 2017-01-18 ENCOUNTER — Encounter: Payer: Self-pay | Admitting: Family Medicine

## 2017-01-18 NOTE — Assessment & Plan Note (Signed)
Controlled, no change in medication DASH diet and commitment to daily physical activity for a minimum of 30 minutes discussed and encouraged, as a part of hypertension management. The importance of attaining a healthy weight is also discussed.  BP/Weight 01/16/2017 11/12/2016 11/04/2016 10/21/2016 02/26/2016 12/12/2015 [id number]  Systolic BP 123XX123 123456 0000000 123XX123 123456 123456 [id number]  Diastolic BP 80 88 78 80 88 78 72  Wt. (Lbs) 301 306.4 297.08 302 296 293 295  BMI 48.58 49.45 47.95 48.74 47.8 47.31 47.64

## 2017-01-18 NOTE — Assessment & Plan Note (Addendum)
Decongestant , antibiotic and proventil prescribed, pt to call back if symptoms persist beyond 3 weeks or worsen Short course of prednisone prescribed also

## 2017-01-18 NOTE — Assessment & Plan Note (Signed)
Use of daily anti histamine encouraged esp with current symptoms

## 2017-01-18 NOTE — Assessment & Plan Note (Signed)
Antibiotic prescribed, and advised saline nasal flushes

## 2017-01-18 NOTE — Assessment & Plan Note (Signed)
Deteriorated. Patient re-educated about  the importance of commitment to a  minimum of 150 minutes of exercise per week.  The importance of healthy food choices with portion control discussed. Encouraged to start a food diary, count calories and to consider  joining a support group. Sample diet sheets offered. Goals set by the patient for the next several months.   Weight /BMI 01/16/2017 11/12/2016 11/04/2016  WEIGHT 301 lb 306 lb 6.4 oz 297 lb 1.3 oz  HEIGHT 5\' 6"  5\' 6"  5\' 6"   BMI 48.58 kg/m2 49.45 kg/m2 47.95 kg/m2

## 2017-01-18 NOTE — Progress Notes (Signed)
[name]     MRN: [id number]      DOB: 1961/02/07   HPI [name] is here for follow up and re-evaluation of chronic medical conditions, medication management and review of any available recent lab and radiology data.  Preventive health is updated, specifically  Cancer screening and Immunization.   Questions or concerns regarding consultations or procedures which the PT has had in the interim are  addressed. 1 week h/o worsening head and chest congestion, associated with fever and chills intermittently. Nasal drainage has thickened , and is yellowish green, and at times bloody. Sputum is thick and yellow. C/o bilateral ear pressure, denies hearing loss and sore throat. Increasing fatigue , poor appetitie and sleep disturbed by cough. No improvement with OTC medication.    ROS See HPI  Denies chest pains, palpitations and leg swelling Denies abdominal pain, nausea, vomiting,diarrhea or constipation.   Denies dysuria, frequency, hesitancy or incontinence. Denies joint pain, swelling and limitation in mobility. Denies headaches, seizures, numbness, or tingling. Denies depression, anxiety or insomnia. Denies skin break down or rash.   PE  BP 122/80   Pulse 80   Temp 98.8 F (37.1 C) (Oral)   Resp 16   Ht 5\' 6"  (1.676 m)   Wt (!) 301 lb (136.5 kg)   LMP 10/19/2011   SpO2 97%   BMI 48.58 kg/m   Patient alert and oriented and in no cardiopulmonary distress. Ill appearing HEENT: No facial asymmetry, EOMI,   oropharynx pink and moist.  Neck supple, anterior cervical adenitis, and maxillary sinus tenderness, bilateral, TM clear  Chest: bilateral wheezes and few scattered crackles, decreased air entry .  CVS: S1, S2 no murmurs, no S3.Regular rate.  ABD: Soft non tender.   Ext: No edema  MS: Adequate ROM spine, shoulders, hips and knees.  Skin: Intact, no ulcerations or rash noted.  Psych: Good eye contact, normal affect. Memory intact not anxious or depressed  appearing.  CNS: CN 2-12 intact, power,  normal throughout.no focal deficits noted.   Assessment & Plan  Acute maxillary sinusitis Antibiotic prescribed, and advised saline nasal flushes  Acute bronchitis Decongestant , antibiotic and proventil prescribed, pt to call back if symptoms persist beyond 3 weeks or worsen Short course of prednisone prescribed also  HTN (hypertension) Controlled, no change in medication DASH diet and commitment to daily physical activity for a minimum of 30 minutes discussed and encouraged, as a part of hypertension management. The importance of attaining a healthy weight is also discussed.  BP/Weight 01/16/2017 11/12/2016 11/04/2016 10/21/2016 02/26/2016 12/12/2015 [id number]  Systolic BP 123XX123 123456 0000000 123XX123 123456 123456 [id number]  Diastolic BP 80 88 78 80 88 78 72  Wt. (Lbs) 301 306.4 297.08 302 296 293 295  BMI 48.58 49.45 47.95 48.74 47.8 47.31 47.64       Morbid obesity Deteriorated. Patient re-educated about  the importance of commitment to a  minimum of 150 minutes of exercise per week.  The importance of healthy food choices with portion control discussed. Encouraged to start a food diary, count calories and to consider  joining a support group. Sample diet sheets offered. Goals set by the patient for the next several months.   Weight /BMI 01/16/2017 11/12/2016 11/04/2016  WEIGHT 301 lb 306 lb 6.4 oz 297 lb 1.3 oz  HEIGHT 5\' 6"  5\' 6"  5\' 6"   BMI 48.58 kg/m2 49.45 kg/m2 47.95 kg/m2      Allergic rhinitis Use of daily anti histamine encouraged esp with  current symptoms

## 2017-02-04 ENCOUNTER — Ambulatory Visit: Payer: BC Managed Care – PPO | Admitting: Cardiology

## 2017-02-04 ENCOUNTER — Ambulatory Visit: Payer: BC Managed Care – PPO | Admitting: Physician Assistant

## 2017-02-10 ENCOUNTER — Ambulatory Visit (INDEPENDENT_AMBULATORY_CARE_PROVIDER_SITE_OTHER): Payer: BC Managed Care – PPO | Admitting: Cardiology

## 2017-02-10 ENCOUNTER — Encounter: Payer: Self-pay | Admitting: Cardiology

## 2017-02-10 VITALS — BP 144/82 | HR 73 | Ht 66.0 in | Wt 306.0 lb

## 2017-02-10 DIAGNOSIS — R000 Tachycardia, unspecified: Secondary | ICD-10-CM | POA: Diagnosis not present

## 2017-02-10 DIAGNOSIS — I10 Essential (primary) hypertension: Secondary | ICD-10-CM | POA: Diagnosis not present

## 2017-02-10 DIAGNOSIS — E669 Obesity, unspecified: Secondary | ICD-10-CM

## 2017-02-10 NOTE — Patient Instructions (Signed)
Medication Instructions:  Your physician recommends that you continue on your current medications as directed. Please refer to the Current Medication list given to you today.   Labwork: None  Testing/Procedures: None  Follow-Up: Your physician recommends that you schedule a follow-up appointment AS NEEDED with Cecilie Kicks, NP.  Any Other Special Instructions Will Be Listed Below (If Applicable).     If you need a refill on your cardiac medications before your next appointment, please call your pharmacy.

## 2017-02-10 NOTE — Progress Notes (Signed)
Cardiology Office Note   Date:  02/10/2017   ID:  [name], DOB 1961-11-24, MRN [id number]  PCP:  Janice Nakayama, MD  Cardiologist:  Dr. Marlou Porch    Chief Complaint  Patient presents with  . Palpitations      History of Present Illness: [name] is a 56 y.o. female who presents for follow up after monitor.  She did have a short burst of NSVT but was asymptomatic.  Her echo with normal EF.   I do not have her completed report back but will call her when it is returned.   I saw her originally 11/12/16 for fluttering in her chest, she thought she may pass out.  It was her second episode.  We had her have an echo which was normal.  Pt is thinking of Gastric sleeve to lose wt.  If so she may need an stress tent.   Pt has had no further episodes and no chest pain or SOB.   Past Medical History:  Diagnosis Date  . Hypertension   . Seasonal allergies     Past Surgical History:  Procedure Laterality Date  . COLONOSCOPY N/A 09/15/2014   Procedure: COLONOSCOPY;  Surgeon: Janice Houston, MD;  Location: [facility];  Service: Endoscopy;  Laterality: N/A;  830  . Cyst removed from buttock  1980's     Current Outpatient Prescriptions  Medication Sig Dispense Refill  . fish oil-omega-3 fatty acids 1000 MG capsule Take 1 g by mouth daily.    . fluconazole (DIFLUCAN) 150 MG tablet One tablet once daily as needed for vaginal itch after antibiotics 2 tablet 0  . labetalol (NORMODYNE) 200 MG tablet Take 1 tablet (200 mg total) by mouth daily. 30 tablet 5  . loratadine (CLARITIN) 10 MG tablet Take 10 mg by mouth daily.    . Multiple Vitamin (MULTIVITAMIN) tablet Take 1 tablet by mouth daily.      . vitamin C (ASCORBIC ACID) 500 MG tablet Take 500 mg by mouth daily.     No current facility-administered medications for this visit.     Allergies:   Sulfamethoxazole-trimethoprim    Social History:  The patient  reports that she has never smoked. She has never used smokeless  tobacco. She reports that she does not drink alcohol or use drugs.   Family History:  The patient's family history includes Diabetes in her mother.    ROS:  General:no colds or fevers, no weight changes HEENT:no blurred vision, no congestion CV:see HPI PUL:see HPI GI:no diarrhea constipation or melena, no indigestion Neuro:no syncope, no lightheadedness   Wt Readings from Last 3 Encounters:  02/10/17 (!) 306 lb (138.8 kg)  01/16/17 (!) 301 lb (136.5 kg)  11/12/16 (!) 306 lb 6.4 oz (139 kg)     PHYSICAL EXAM: VS:  BP (!) 144/82   Pulse 73   Ht 5\' 6"  (1.676 m)   Wt (!) 306 lb (138.8 kg)   LMP 10/19/2011   BMI 49.39 kg/m  , BMI Body mass index is 49.39 kg/m. General:Pleasant affect, NAD HEENT:normocephalic, sclera clear, mucus membranes moist Heart:S1S2 RRR without murmur, gallup, rub or click Lungs:clear without rales, rhonchi, or wheezes [id number], non tender, + BS, do not palpate liver spleen or masses Neuro:alert and oriented X 3, MAE, follows commands, + facial symmetry    EKG:  EKG is NOT ordered today.   Recent Labs: No results found for requested labs within last 8760 hours.    Lipid Panel  Component Value Date/Time   CHOL 190 12/07/2015 0924   TRIG 69 12/07/2015 0924   HDL 60 12/07/2015 0924   CHOLHDL 3.2 12/07/2015 0924   VLDL 14 12/07/2015 0924   LDLCALC 116 12/07/2015 0924       Other studies Reviewed: Additional studies/ records that were reviewed today include: ECHO:. Study Conclusions  - Left ventricle: The cavity size was normal. Wall thickness was   normal. Systolic function was normal. The estimated ejection   fraction was in the range of 55% to 60%. Wall motion was normal;   there were no regional wall motion abnormalities. Left   ventricular diastolic function parameters were normal. - Aortic valve: Valve area (VTI): 2.52 cm^2. Valve area (Vmax):   2.49 cm^2. Valve area (Vmean): 2.59 cm^2. - Mitral valve: There was mild  regurgitation. - Left atrium: The atrium was mildly dilated. - Technically adequate study.   ASSESSMENT AND PLAN:  1.  Fluttering /palpitations no further episodes  2. On monitor she had 8 beats of NSVT that she was not aware of.  Normal EF  3. Obesity plans for gastric surgery if she needs stress test in next few months I will be glad to write order without another visit though she would need to see Dr. Marlou Porch for results.    Current medicines are reviewed with the patient today.  The patient Has no concerns regarding medicines.  The following changes have been made:  See above Labs/ tests ordered today include:see above  Disposition:   FU:  see above  Signed, Cecilie Kicks, NP  02/10/2017 4:53 PM    [facility] [place], [place], [place], [place] Phone: [phone]; Fax: [phone]

## 2017-04-22 ENCOUNTER — Other Ambulatory Visit (HOSPITAL_COMMUNITY): Payer: Self-pay | Admitting: General Surgery

## 2017-04-22 ENCOUNTER — Other Ambulatory Visit: Payer: Self-pay | Admitting: *Deleted

## 2017-04-22 DIAGNOSIS — I10 Essential (primary) hypertension: Secondary | ICD-10-CM

## 2017-04-22 DIAGNOSIS — Z01810 Encounter for preprocedural cardiovascular examination: Secondary | ICD-10-CM

## 2017-04-28 ENCOUNTER — Telehealth (HOSPITAL_COMMUNITY): Payer: Self-pay | Admitting: Cardiology

## 2017-04-29 ENCOUNTER — Ambulatory Visit (HOSPITAL_COMMUNITY): Payer: BC Managed Care – PPO

## 2017-05-01 NOTE — Telephone Encounter (Signed)
User: Cherie Dark A Date/time: 04/29/17 1:46 PM  Comment: Called pt and lmsg for her to CB to get scheduled for myoview.   Context:  Outcome: Left Message  Phone number: [phone] Phone Type: Mobile  Comm. type: Telephone Call type: Outgoing  Contact: Precious Gilding D Relation to patient: Self    User: Cherie Dark A Date/time: 04/28/17 2:17 PM  Comment: Called pt and lmsg for her to CB to get scheduled for myoview.   Context:  Outcome: Left Message  Phone number: [phone] Phone Type: Mobile  Comm. type: Telephone Call type: Outgoing  Contact: Precious Gilding D Relation to patient: Self      User: Cherie Dark A Date/time: 04/30/17 11:02 AM  Comment: Called pt and lmsg for her to CB to get scheduled for myoview.   Context: [place] Schedule Orders/Appt Requests Outcome: Left Message  Phone number: [phone] Phone Type: Mobile  Comm. type: Telephone Call type: Outgoing  Contact: Precious Gilding D Relation to patient: Self

## 2017-05-06 ENCOUNTER — Encounter: Payer: Self-pay | Admitting: Cardiology

## 2017-05-21 ENCOUNTER — Encounter: Payer: Self-pay | Admitting: Skilled Nursing Facility1

## 2017-05-21 ENCOUNTER — Encounter: Payer: BC Managed Care – PPO | Attending: General Surgery | Admitting: Skilled Nursing Facility1

## 2017-05-21 DIAGNOSIS — Z713 Dietary counseling and surveillance: Secondary | ICD-10-CM | POA: Diagnosis present

## 2017-05-21 DIAGNOSIS — M1710 Unilateral primary osteoarthritis, unspecified knee: Secondary | ICD-10-CM | POA: Diagnosis not present

## 2017-05-21 DIAGNOSIS — Z6841 Body Mass Index (BMI) 40.0 and over, adult: Secondary | ICD-10-CM | POA: Diagnosis not present

## 2017-05-21 DIAGNOSIS — I10 Essential (primary) hypertension: Secondary | ICD-10-CM | POA: Diagnosis not present

## 2017-05-21 NOTE — Patient Instructions (Signed)
Follow Pre-Op Goals Try Protein Shakes Call [facility] at [phone] when surgery is scheduled to enroll in Pre-Op Class  Things to remember:  Please always be honest with [place]. We want to support you!  If you have any questions or concerns in between appointments, please call or email   The diet after surgery will be high protein and low in carbohydrate.  Vitamins and calcium need to be taken for the rest of your life.  Feel free to include support people in any classes or appointments.   Supplement recommendations:  Before Surgery   1 Complete Multivitamin with Iron  3000 IU Vitamin D3  After Surgery   2 Chewable Multivitamins  **Best Choice - Opurity: Bypass and Sleeve Optimized Chewable    Bariatric Advantage Advanced Multi EA      3 Chewable Calcium (500 mg each, total 1200-1500 mg per day)  **Best Choice - Celebrate, Bariatric Advantage, or Wellesse  Other Options:  2 Celebrate MultiComplete with 18 mg Iron (this provides 6000 IU of  Vitamin D3)  4 Celebrate Essential Multi 2 in 1 (has calcium) + 18-60 mg separate  iron  Vitamins and Calcium are available at:   [facility]   [place], [place], [place] [address]   [email or web address].com  www.celebratevitamins.com  www.amazon.com

## 2017-05-21 NOTE — Progress Notes (Signed)
Pre-Op Assessment Visit:  Pre-Operative Sleeve Gastrectomy Surgery  Medical Nutrition Therapy:  Appt start time: 10:16  End time:  11:01  Patient was seen on 05/21/2017 for Pre-Operative Nutrition Assessment. Assessment and letter of approval faxed to [facility] Surgery Bariatric Surgery Program coordinator on 05/21/2017.   Pt states due to a softball injury her knee bothers her.   Start weight at [facility]: 304 BMI: 49.07  24 hr Dietary Recall: First Meal: eggs and oatmeal----waffle and [place] bacon Snack: nuts Second Meal: sandwich, yogurt, chips----none Snack: yogrut Third Meal: fast food----bowl of cereal Snack: cookies and popcorn Beverages: sparkling soda, gingerale/lemonade mix, water  Encouraged to engage in 150 minutes of moderate physical activity including cardiovascular and weight baring weekly  Handouts given during visit include:  . Pre-Op Goals . Bariatric Surgery Protein Shakes During the appointment today the following Pre-Op Goals were reviewed with the patient: . Maintain or lose weight as instructed by your surgeon . Make healthy food choices . Begin to limit portion sizes . Limited concentrated sugars and fried foods . Keep fat/sugar in the single digits per serving on             food labels . Practice CHEWING your food  (aim for 30 chews per bite or until applesauce consistency) . Practice not drinking 15 minutes before, during, and 30 minutes after each meal/snack . Avoid all carbonated beverages  . Avoid/limit caffeinated beverages  . Avoid all sugar-sweetened beverages . Consume 3 meals per day; eat every 3-5 hours . Make a list of non-food related activities . Aim for 64-100 ounces of FLUID daily  . Aim for at least 60-80 grams of PROTEIN daily . Look for a liquid protein source that contain ?15 g protein and ?5 g carbohydrate  (ex: shakes, drinks, shots)  -Follow diet recommendations listed below   Energy and Macronutrient  Recomendations: Calories: 1600 Carbohydrate: 180 Protein: 120 Fat: 44  Demonstrated degree of understanding via:  Teach Back  Teaching Method Utilized:  Visual Auditory Hands on  Barriers to learning/adherence to lifestyle change: none identified   Patient to call the [facility] to enroll in Pre-Op and Post-Op Nutrition Education when surgery date is scheduled.

## 2017-05-30 ENCOUNTER — Other Ambulatory Visit: Payer: Self-pay

## 2017-05-30 ENCOUNTER — Telehealth: Payer: Self-pay | Admitting: Family Medicine

## 2017-05-30 MED ORDER — LABETALOL HCL 200 MG PO TABS
200.0000 mg | ORAL_TABLET | Freq: Every day | ORAL | 3 refills | Status: DC
Start: 2017-05-30 — End: 2017-10-09

## 2017-05-30 NOTE — Telephone Encounter (Signed)
Cb#: [phone] Pharmacy: [facility] in [place]    Patient is out of refills for labetalol She has an appt on 07/16/17, does she need to come in sooner to get a refill ?

## 2017-05-30 NOTE — Telephone Encounter (Signed)
Refills sent

## 2017-06-23 ENCOUNTER — Other Ambulatory Visit: Payer: Self-pay

## 2017-06-23 ENCOUNTER — Ambulatory Visit (HOSPITAL_COMMUNITY)
Admission: RE | Admit: 2017-06-23 | Discharge: 2017-06-23 | Disposition: A | Discharge status: Home / Self Care | Payer: BC Managed Care – PPO | Source: Ambulatory Visit | Attending: General Surgery | Admitting: General Surgery

## 2017-06-23 ENCOUNTER — Other Ambulatory Visit (HOSPITAL_COMMUNITY): Payer: Self-pay | Admitting: General Surgery

## 2017-06-23 DIAGNOSIS — K449 Diaphragmatic hernia without obstruction or gangrene: Secondary | ICD-10-CM | POA: Insufficient documentation

## 2017-06-23 DIAGNOSIS — Z01818 Encounter for other preprocedural examination: Secondary | ICD-10-CM | POA: Insufficient documentation

## 2017-06-23 DIAGNOSIS — J9811 Atelectasis: Secondary | ICD-10-CM | POA: Insufficient documentation

## 2017-06-30 ENCOUNTER — Ambulatory Visit: Payer: BC Managed Care – PPO

## 2017-07-07 ENCOUNTER — Encounter: Payer: BC Managed Care – PPO | Attending: General Surgery | Admitting: Registered"

## 2017-07-07 DIAGNOSIS — I10 Essential (primary) hypertension: Secondary | ICD-10-CM | POA: Insufficient documentation

## 2017-07-07 DIAGNOSIS — E669 Obesity, unspecified: Secondary | ICD-10-CM

## 2017-07-07 DIAGNOSIS — Z713 Dietary counseling and surveillance: Secondary | ICD-10-CM | POA: Diagnosis not present

## 2017-07-07 DIAGNOSIS — Z6841 Body Mass Index (BMI) 40.0 and over, adult: Secondary | ICD-10-CM | POA: Diagnosis not present

## 2017-07-07 DIAGNOSIS — M1710 Unilateral primary osteoarthritis, unspecified knee: Secondary | ICD-10-CM | POA: Insufficient documentation

## 2017-07-07 NOTE — Progress Notes (Signed)
  Pre-Operative Nutrition Class:  Appt start time: 8:15  End time:  9:15  Patient was seen on 07/07/2017 for Pre-Operative Bariatric Surgery Education at [facility].   Surgery date: TBD Surgery type: Sleeve gastrectomy [facility] weight at [facility]: 304.0 Weight today: 298.3   Samples given per MNT protocol. Patient educated on appropriate usage: Bariatric Advantage Multivitamin Lot #[id number] Exp: 05/2018  Bariatric Advantage Calcium Citrate Lot # [id number] Exp: 09/03/2017  Bariatric Fusion Probiotic Soft Chews Lot # [id number] Exp: 11/02/209  Premier Protein Clear Drink Lot # [id number] Exp: 01/01/2018   The following the learning objectives were met by the patient during this course:  Identify Pre-Op Dietary Goals and will begin 2 weeks pre-operatively  Identify appropriate sources of fluids and proteins   State protein recommendations and appropriate sources pre and post-operatively  Identify Post-Operative Dietary Goals and will follow for 2 weeks post-operatively  Identify appropriate multivitamin and calcium sources  Describe the need for physical activity post-operatively and will follow MD recommendations  State when to call healthcare provider regarding medication questions or post-operative complications  Handouts given during class include:  Pre-Op Bariatric Surgery Diet Handout  Protein Shake Handout  Post-Op Bariatric Surgery Nutrition Handout  [facility] Program Information Flyer  Support Group Information Flyer  [facility] Bariatric Supplements Price List  Follow-Up Plan: Patient will follow-up at [facility] 2 weeks post operatively for diet advancement per MD.

## 2017-07-16 ENCOUNTER — Ambulatory Visit: Payer: BC Managed Care – PPO | Admitting: Family Medicine

## 2017-07-21 ENCOUNTER — Ambulatory Visit: Payer: BC Managed Care – PPO | Admitting: Family Medicine

## 2017-07-21 ENCOUNTER — Encounter: Payer: Self-pay | Admitting: Family Medicine

## 2017-08-20 ENCOUNTER — Telehealth: Payer: Self-pay | Admitting: Family Medicine

## 2017-08-20 DIAGNOSIS — E785 Hyperlipidemia, unspecified: Secondary | ICD-10-CM

## 2017-08-20 DIAGNOSIS — I10 Essential (primary) hypertension: Secondary | ICD-10-CM

## 2017-08-20 NOTE — Telephone Encounter (Signed)
Done

## 2017-08-20 NOTE — Telephone Encounter (Signed)
-----   Message from Fayrene Helper, MD sent at 08/19/2017  1:04 PM EDT ----- Regarding: lab orders Pls reorder all the labs ordered for this pt since 10/2016 which she still has not had done and mail them to her home, I am hoping that she comes in  For f/u next month

## 2017-08-25 ENCOUNTER — Ambulatory Visit (INDEPENDENT_AMBULATORY_CARE_PROVIDER_SITE_OTHER): Payer: BC Managed Care – PPO | Admitting: Family Medicine

## 2017-08-25 VITALS — BP 130/76 | HR 73 | Temp 98.0°F | Resp 16 | Ht 66.0 in | Wt 304.2 lb

## 2017-08-25 DIAGNOSIS — J3089 Other allergic rhinitis: Secondary | ICD-10-CM | POA: Diagnosis not present

## 2017-08-25 DIAGNOSIS — J0100 Acute maxillary sinusitis, unspecified: Secondary | ICD-10-CM | POA: Diagnosis not present

## 2017-08-25 DIAGNOSIS — I10 Essential (primary) hypertension: Secondary | ICD-10-CM

## 2017-08-25 DIAGNOSIS — Z1231 Encounter for screening mammogram for malignant neoplasm of breast: Secondary | ICD-10-CM

## 2017-08-25 MED ORDER — LEVOFLOXACIN 750 MG PO TABS: 750.0000 mg | ORAL_TABLET | Freq: Every day | ORAL | 0 refills | Status: DC

## 2017-08-25 MED ORDER — FLUCONAZOLE 150 MG PO TABS: ORAL_TABLET | ORAL | 0 refills | Status: DC

## 2017-08-25 NOTE — Progress Notes (Signed)
   [name]     MRN: [id number]      DOB: 10-25-1961   HPI Ms. Heiler is here c/o chills and generalized body aches, left maxillary pressure, sinus drainage is now clear but is still thick, symptomatic up to last night. Treated 1 week ago but inconsistent use of antibiotics Hd lost hr father in law last Friday in August, and has been extremely busy and  Overwhelmed ever since that  Time. Has planned sleeve gastrectomy hopefully in December ROS Denies chest congestion, productive cough or wheezing. Denies chest pains, palpitations and leg swelling Denies abdominal pain, nausea, vomiting,diarrhea or constipation.   Denies dysuria, frequency, hesitancy or incontinence. Denies joint pain, swelling and limitation in mobility. Denies headaches, seizures, numbness, or tingling. Denies depression, anxiety or insomnia. Denies skin break down or rash.   PE  BP 130/76 (BP Location: Left Arm, Patient Position: Sitting, Cuff Size: Normal)   Pulse 73   Temp 98 F (36.7 C) (Other (Comment))   Resp 16   Ht 5\' 6"  (1.676 m)   Wt (!) 304 lb 4 oz (138 kg)   LMP 10/19/2011 Comment: pt signed preg test waiver 06/23/17  SpO2 97%   BMI 49.11 kg/m   Patient alert and oriented and in no cardiopulmonary distress.  HEENT: No facial asymmetry, EOMI,   oropharynx pink and moist.  Neck supple no JVD, no mass.Left maxillary sinus tenderness. TM clear bilaterally, Nasal mucosa erythematous and edematous  Chest: Clear to auscultation bilaterally.  CVS: S1, S2 no murmurs, no S3.Regular rate.  ABD: Soft non tender.   Ext: No edema  MS: Adequate ROM spine, shoulders, hips and knees.  Skin: Intact, no ulcerations or rash noted.  Psych: Good eye contact, normal affect. Memory intact not anxious or depressed appearing.  CNS: CN 2-12 intact, power,  normal throughout.no focal deficits noted.   Assessment & Plan  Acute maxillary sinusitis Left maxillary sinusitis, levaquin prescribed and pt re  educated re need t take entire course as prescribed  HTN (hypertension) Controlled, no change in medication DASH diet and commitment to daily physical activity for a minimum of 30 minutes discussed and encouraged, as a part of hypertension management. The importance of attaining a healthy weight is also discussed.  BP/Weight 08/25/2017 07/07/2017 05/21/2017 02/10/2017 01/16/2017 11/12/2016 00/93/8182  Systolic BP 993 - - 716 967 893 810  Diastolic BP 76 - - 82 80 88 78  Wt. (Lbs) 304.25 298.3 304 306 301 306.4 297.08  BMI 49.11 48.15 49.07 49.39 48.58 49.45 47.95       Morbid obesity Deteriorated. Patient re-educated about  the importance of commitment to a  minimum of 150 minutes of exercise per week.  The importance of healthy food choices with portion control discussed. Encouraged to start a food diary, count calories and to consider  joining a support group. Sample diet sheets offered. Goals set by the patient for the next several months.   Weight /BMI 08/25/2017 07/07/2017 05/21/2017  WEIGHT 304 lb 4 oz 298 lb 4.8 oz 304 lb  HEIGHT 5\' 6"  - 5\' 6"   BMI 49.11 kg/m2 48.15 kg/m2 49.07 kg/m2  in the process of getting sleeve gastrectomy    Allergic rhinitis Increased symptoms in past 2 to 3 weeks, nees to consitently use medication for symptom control and reduction of complications like bacterial superinfection an generally "feelling sick days"

## 2017-08-25 NOTE — Patient Instructions (Signed)
F/u in 6 months, call if you need me before  Mamogram to be scheduled at checkout  All the best with surgery  Come for flu vaccine in next 2 to 3 weeks  CONSISTENTLY take once daily levaquin

## 2017-08-28 ENCOUNTER — Encounter: Payer: Self-pay | Admitting: Family Medicine

## 2017-08-28 NOTE — Assessment & Plan Note (Signed)
Deteriorated. Patient re-educated about  the importance of commitment to a  minimum of 150 minutes of exercise per week.  The importance of healthy food choices with portion control discussed. Encouraged to start a food diary, count calories and to consider  joining a support group. Sample diet sheets offered. Goals set by the patient for the next several months.   Weight /BMI 08/25/2017 07/07/2017 05/21/2017  WEIGHT 304 lb 4 oz 298 lb 4.8 oz 304 lb  HEIGHT 5\' 6"  - 5\' 6"   BMI 49.11 kg/m2 48.15 kg/m2 49.07 kg/m2  in the process of getting sleeve gastrectomy

## 2017-08-28 NOTE — Assessment & Plan Note (Signed)
Controlled, no change in medication DASH diet and commitment to daily physical activity for a minimum of 30 minutes discussed and encouraged, as a part of hypertension management. The importance of attaining a healthy weight is also discussed.  BP/Weight 08/25/2017 07/07/2017 05/21/2017 02/10/2017 01/16/2017 11/12/2016 65/68/1275  Systolic BP 170 - - 017 494 496 759  Diastolic BP 76 - - 82 80 88 78  Wt. (Lbs) 304.25 298.3 304 306 301 306.4 297.08  BMI 49.11 48.15 49.07 49.39 48.58 49.45 47.95

## 2017-08-28 NOTE — Assessment & Plan Note (Signed)
Left maxillary sinusitis, levaquin prescribed and pt re educated re need t take entire course as prescribed

## 2017-08-28 NOTE — Assessment & Plan Note (Signed)
Increased symptoms in past 2 to 3 weeks, nees to consitently use medication for symptom control and reduction of complications like bacterial superinfection an generally "feelling sick days"

## 2017-09-01 ENCOUNTER — Ambulatory Visit (HOSPITAL_COMMUNITY): Payer: BC Managed Care – PPO

## 2017-10-09 ENCOUNTER — Other Ambulatory Visit: Payer: Self-pay | Admitting: Family Medicine

## 2017-11-03 ENCOUNTER — Telehealth: Payer: Self-pay | Admitting: *Deleted

## 2017-11-03 DIAGNOSIS — R202 Paresthesia of skin: Secondary | ICD-10-CM

## 2017-11-03 NOTE — Telephone Encounter (Signed)
No availability for work in at 4 pm this week, please let her know, if very symptomatic will need urgent care, recommend chem 7 and magnesium can be ordered and done today at any lab to ensure these are normal, ( not stat) she will be contacted with lab results tomorrow

## 2017-11-03 NOTE — Telephone Encounter (Signed)
Do you want to work in at [place]?

## 2017-11-03 NOTE — Telephone Encounter (Signed)
See below msg. Can you make patient aware?  (I have sent the lab order to [facility])

## 2017-11-03 NOTE — Telephone Encounter (Signed)
Patient called left message requesting to see Dr Moshe Cipro today, patient states she is having tingling sensation in her legs and arms for about 3 days. Patient states if she can be seen today it will have to be 4:00.

## 2017-11-04 LAB — BASIC METABOLIC PANEL
BUN: 15 mg/dL (ref 7–25)
CALCIUM: 9.8 mg/dL (ref 8.6–10.4)
CHLORIDE: 107 mmol/L (ref 98–110)
CO2: 29 mmol/L (ref 20–32)
Creat: 0.83 mg/dL (ref 0.50–1.05)
GLUCOSE: 88 mg/dL (ref 65–139)
Potassium: 4 mmol/L (ref 3.5–5.3)
Sodium: 145 mmol/L (ref 135–146)

## 2017-11-04 LAB — MAGNESIUM: Magnesium: 2 mg/dL (ref 1.5–2.5)

## 2017-11-07 ENCOUNTER — Telehealth: Payer: Self-pay | Admitting: *Deleted

## 2017-11-07 NOTE — Telephone Encounter (Signed)
Patient called requesting her results. Please advise [phone]  Patient states Dr Redmond Pulling told her she needed a follow up visit with Dr Moshe Cipro before her surgery on 11/18/17. When can this patient be scheduled next week?

## 2017-11-10 NOTE — Telephone Encounter (Signed)
Patient is scheduled for 11/13/17 at 9:40. Patient is aware.

## 2017-11-10 NOTE — Telephone Encounter (Signed)
Next week will be too late if her surgery is next tuesday. Will have to find a place this week.

## 2017-11-11 ENCOUNTER — Emergency Department (HOSPITAL_COMMUNITY): Payer: BC Managed Care – PPO

## 2017-11-11 ENCOUNTER — Other Ambulatory Visit: Payer: Self-pay

## 2017-11-11 ENCOUNTER — Encounter (HOSPITAL_COMMUNITY): Payer: Self-pay | Admitting: Emergency Medicine

## 2017-11-11 ENCOUNTER — Emergency Department (HOSPITAL_COMMUNITY)
Admission: EM | Admit: 2017-11-11 | Discharge: 2017-11-11 | Disposition: A | Discharge status: Home / Self Care | Payer: BC Managed Care – PPO | Attending: Emergency Medicine | Admitting: Emergency Medicine

## 2017-11-11 DIAGNOSIS — M26621 Arthralgia of right temporomandibular joint: Secondary | ICD-10-CM | POA: Diagnosis not present

## 2017-11-11 DIAGNOSIS — Z79899 Other long term (current) drug therapy: Secondary | ICD-10-CM | POA: Diagnosis not present

## 2017-11-11 DIAGNOSIS — R202 Paresthesia of skin: Secondary | ICD-10-CM | POA: Diagnosis present

## 2017-11-11 DIAGNOSIS — I10 Essential (primary) hypertension: Secondary | ICD-10-CM | POA: Insufficient documentation

## 2017-11-11 LAB — I-STAT CHEM 8, ED
BUN: 14 mg/dL (ref 6–20)
CALCIUM ION: 1.26 mmol/L (ref 1.15–1.40)
CREATININE: 0.7 mg/dL (ref 0.44–1.00)
Chloride: 103 mmol/L (ref 101–111)
GLUCOSE: 98 mg/dL (ref 65–99)
HEMATOCRIT: 36 % (ref 36.0–46.0)
HEMOGLOBIN: 12.2 g/dL (ref 12.0–15.0)
Potassium: 4 mmol/L (ref 3.5–5.1)
Sodium: 143 mmol/L (ref 135–145)
TCO2: 28 mmol/L (ref 22–32)

## 2017-11-11 NOTE — ED Triage Notes (Signed)
PT c/o headache, body aches, sinus drainage, right side of face pain x4 days intermittently.

## 2017-11-11 NOTE — Discharge Instructions (Signed)
Take over the counter tylenol and/or ibuprofen, as directed on packaging, as needed for discomfort.  Call your regular medical doctor and your dentist today to schedule a follow up appointment within the next week.  Return to the Emergency Department immediately sooner if worsening.

## 2017-11-11 NOTE — ED Provider Notes (Signed)
[facility] EMERGENCY DEPARTMENT Provider Note   CSN: [id number] Arrival date & time: 11/11/17  1010     History   Chief Complaint Chief Complaint  Patient presents with  . Headache    HPI [name] is a 56 y.o. female.   Headache      Pt was seen at 1210. Per pt, c/o gradual onset and persistence of constant "tingling all over" for the past 1 week. Pt also c/o right sided TMJ area "pain" for the past 4 days. Describes the pain as radiating up into the right side of her head and down into her mandible. Pain worsens with palpation of the area. Denies dental pain, no sore throat, no ear pain, no visual changes, no focal motor weakness, no ataxia, no slurred speech, no facial droop, no CP/SOB, no abd pain, no N/V/D, no neck or back pain.    Past Medical History:  Diagnosis Date  . Hypertension   . Seasonal allergies     Patient Active Problem List   Diagnosis Date Noted  . Acute bronchitis 02/26/2016  . Acute maxillary sinusitis 04/12/2014  . Dyslipidemia 07/05/2013  . Morbid obesity ([place]) 06/28/2013  . HTN (hypertension) 05/28/2012  . Sleep-disordered breathing 06/27/2010  . Allergic rhinitis 02/02/2010  . Goiter 12/20/2008  . GERD 04/06/2008    Past Surgical History:  Procedure Laterality Date  . COLONOSCOPY N/A 09/15/2014   Procedure: COLONOSCOPY;  Surgeon: Rogene Houston, MD;  Location: [facility];  Service: Endoscopy;  Laterality: N/A;  830  . Cyst removed from buttock  1980's    OB History    No data available       Home Medications    Prior to Admission medications   Medication Sig Start Date End Date Taking? Authorizing Provider  acetaminophen (TYLENOL) 500 MG tablet Take 1,500 mg by mouth every 6 (six) hours as needed for moderate pain or headache.    Yes [provider]  cetirizine (ZYRTEC) 10 MG tablet Take 10 mg by mouth at bedtime.   Yes [provider]  fish oil-omega-3 fatty acids 1000 MG capsule Take 1 g by mouth  daily.   Yes [provider]  fluticasone (FLONASE) 50 MCG/ACT nasal spray Place 2 sprays into both nostrils daily as needed for allergies or rhinitis.   Yes [provider]  labetalol (NORMODYNE) 200 MG tablet TAKE 1 TABLET BY MOUTH ONCE DAILY 10/09/17  Yes Fayrene Helper, MD  Multiple Vitamin (MULTIVITAMIN) tablet Take 1 tablet by mouth daily.     Yes [provider]  naproxen sodium (ALEVE) 220 MG tablet Take 440 mg by mouth daily as needed (pain).   Yes [provider]  Specialty Vitamins Products ([place] METABOLISM) TABS Take 1 tablet by mouth daily.   Yes [provider]  vitamin C (ASCORBIC ACID) 500 MG tablet Take 500 mg by mouth daily.   Yes [provider]  fluconazole (DIFLUCAN) 150 MG tablet One tablet once daily as needed, for vaginal itch associated with antibiotic use Patient not taking: Reported on 11/11/2017 08/25/17   Fayrene Helper, MD  levofloxacin (LEVAQUIN) 750 MG tablet Take 1 tablet (750 mg total) by mouth daily. Patient not taking: Reported on 11/11/2017 08/25/17   Fayrene Helper, MD  loratadine (CLARITIN) 10 MG tablet Take 10 mg by mouth daily.    [provider]    Family History Family History  Problem Relation Age of Onset  . Diabetes Mother   .  Colon cancer Neg Hx     Social History Social History   Tobacco Use  . Smoking status: Never Smoker  . Smokeless tobacco: Never Used  Substance Use Topics  . Alcohol use: No  . Drug use: No     Allergies   Sulfamethoxazole-trimethoprim   Review of Systems Review of Systems  Neurological: Positive for headaches.  ROS: Statement: All systems negative except as marked or noted in the HPI; Constitutional: Negative for fever and chills. ; ; Eyes: Negative for eye pain, redness and discharge. ; ; ENMT: +right TMJ area pain. Negative for dental pain, facial swelling, facial rash, ear pain, hoarseness, nasal congestion, sinus  pressure and sore throat. ; ; Cardiovascular: Negative for chest pain, palpitations, diaphoresis, dyspnea and peripheral edema. ; ; Respiratory: Negative for cough, wheezing and stridor. ; ; Gastrointestinal: Negative for nausea, vomiting, diarrhea, abdominal pain, blood in stool, hematemesis, jaundice and rectal bleeding. . ; ; Genitourinary: Negative for dysuria, flank pain and hematuria. ; ; Musculoskeletal: Negative for back pain and neck pain. Negative for swelling and trauma.; ; Skin: Negative for pruritus, rash, abrasions, blisters, bruising and skin lesion.; ; Neuro: +paresthesias. Negative for lightheadedness and neck stiffness. Negative for weakness, altered level of consciousness, altered mental status, extremity weakness, involuntary movement, seizure and syncope.       Physical Exam Updated Vital Signs BP (!) 155/76 (BP Location: Right Arm)   Pulse 86   Temp 98.5 F (36.9 C) (Oral)   Resp 18   Ht 5\' 6"  (1.676 m)   Wt 131.5 kg (290 lb)   LMP 10/19/2011 Comment: pt signed preg test waiver 06/23/17  SpO2 100%   BMI 46.81 kg/m   Physical Exam 1215: Physical examination: Vital signs and O2 SAT: Reviewed; Constitutional: Well developed, Well nourished, Well hydrated, In no acute distress; Head and Face: Normocephalic, Atraumatic.; Eyes: EOMI, PERRL, No scleral icterus; ENMT: Mouth and pharynx normal, Left TM normal, Right TM normal, Mucous membranes moist, No gingival erythema, edema, fluctuance, or drainage.  No intra-oral edema. No submandibular or sublingual edema. No hoarse voice, no drooling, no stridor. No trismus. +right TMJ tenderness to palp, no overlying erythema, edema, or ecchymosis.; ; Neck: Supple, Full range of motion, No lymphadenopathy; Cardiovascular: Regular rate and rhythm, No gallop; Respiratory: Breath sounds clear & equal bilaterally, No wheezes.  Speaking full sentences with ease, Normal respiratory effort/excursion; Chest: Nontender, Movement normal; Abdomen: Soft,  Nontender, Nondistended, Normal bowel sounds; Genitourinary: No CVA tenderness; Extremities: Pulses normal, No tenderness, No edema, No calf edema or asymmetry.; Neuro: AA&Ox3, Major CN grossly intact. No facial droop. Speech clear. No gross focal motor or sensory deficits in extremities.; Skin: Color normal, Warm, Dry.   ED Treatments / Results  Labs (all labs ordered are listed, but only abnormal results are displayed) Labs Reviewed  I-STAT CHEM 8, ED    EKG  EKG Interpretation None       Radiology   Procedures Procedures (including critical care time)  Medications Ordered in ED Medications - No data to display   Initial Impression / Assessment and Plan / ED Course  I have reviewed the triage vital signs and the nursing notes.  Pertinent labs & imaging results that were available during my care of the patient were reviewed by me and considered in my medical decision making (see chart for details).  MDM Reviewed: previous chart, nursing note and vitals Reviewed previous: labs Interpretation: labs and CT scan   Results for orders placed or performed  during the hospital encounter of 11/11/17  I-stat Chem 8, ED  Result Value Ref Range   Sodium 143 135 - 145 mmol/L   Potassium 4.0 3.5 - 5.1 mmol/L   Chloride 103 101 - 111 mmol/L   BUN 14 6 - 20 mg/dL   Creatinine, Ser 0.70 0.44 - 1.00 mg/dL   Glucose, Bld 98 65 - 99 mg/dL   Calcium, Ion 1.26 1.15 - 1.40 mmol/L   TCO2 28 22 - 32 mmol/L   Hemoglobin 12.2 12.0 - 15.0 g/dL   HCT 36.0 36.0 - 46.0 %   Ct Head Wo Contrast Result Date: 11/11/2017 CLINICAL DATA:  Four day history of right-sided headache. Hypertension. EXAM: CT HEAD WITHOUT CONTRAST TECHNIQUE: Contiguous axial images were obtained from the base of the skull through the vertex without intravenous contrast. COMPARISON:  Brain MRI February 05, 2010 FINDINGS: Brain: The ventricles are normal in size and configuration. There is no intracranial mass, hemorrhage,  extra-axial fluid collection, or midline shift. The gray-white compartments appear normal. A focus of increased T2 signal noted in the right superior centrum semiovale on the prior MR study is not appreciable on noncontrast enhanced CT. No evident acute infarct on this study. Vascular: No hyperdense vessel. There is no appreciable arterial vascular calcification. Skull: Bony calvarium appears intact. Sinuses/Orbits: Visualized paranasal sinuses are clear. Visualized orbits appear normal. Other: Visualized mastoid air cells are clear. IMPRESSION: Study within normal limits. Electronically Signed   By: Lowella Grip III M.D.   On: 11/11/2017 13:47    1400:  Workup reassuring. Tx symptomatically at this time. Dx and testing d/w pt and family.  Questions answered.  Verb understanding, agreeable to d/c home with outpt f/u.   Final Clinical Impressions(s) / ED Diagnoses   Final diagnoses:  None    ED Discharge Orders    None        Francine Graven, DO 11/14/17 [phone]

## 2017-11-13 ENCOUNTER — Encounter: Payer: Self-pay | Admitting: Family Medicine

## 2017-11-13 ENCOUNTER — Ambulatory Visit: Payer: BC Managed Care – PPO | Admitting: Family Medicine

## 2017-11-13 VITALS — BP 128/80 | HR 86 | Resp 16 | Ht 66.0 in | Wt 292.0 lb

## 2017-11-13 DIAGNOSIS — I10 Essential (primary) hypertension: Secondary | ICD-10-CM

## 2017-11-13 DIAGNOSIS — Z09 Encounter for follow-up examination after completed treatment for conditions other than malignant neoplasm: Secondary | ICD-10-CM | POA: Diagnosis not present

## 2017-11-13 DIAGNOSIS — J3089 Other allergic rhinitis: Secondary | ICD-10-CM | POA: Diagnosis not present

## 2017-11-13 DIAGNOSIS — R42 Dizziness and giddiness: Secondary | ICD-10-CM | POA: Diagnosis not present

## 2017-11-13 MED ORDER — MECLIZINE HCL 32 MG PO TABS: 32.0000 mg | ORAL_TABLET | Freq: Three times a day (TID) | ORAL | 0 refills | Status: DC | PRN

## 2017-11-13 NOTE — Progress Notes (Signed)
[name]     MRN: [id number]      DOB: 1961/10/27   HPI [name] is here for follow up of recent ED visit on 12/04 with a c/o right sided headache. Radiologic data reviewed, head scan negative, no sinus infection She was diagnosed with TMJ and right otitis media, also has infected right upper molar where she has a crown , and was started on amoxicillin yesterday, has an upcoming apportionment with periodontist on 11/20/2017 Had been scheduled for gastric surgery  Next Tuesday ,  But with current infection needs this cleared up C/o anxiety about her bariatric surgery   Last night had one episode of vertigo, also c/o right ear pain Had tingling in lower extremities  ROS Denies recent fever or chills. Denies sinus pressure, nasal congestion, sore throat. Denies chest congestion, productive cough or wheezing. Denies chest pains, palpitations and leg swelling Denies abdominal pain, nausea, vomiting,diarrhea or constipation.   Denies dysuria, frequency, hesitancy or incontinence. Denies joint pain, swelling and limitation in mobility. Denies  seizures, numbness, or tingling. Denies depression,  or insomnia. Denies skin break down or rash.   PE  BP 128/80   Pulse 86   Resp 16   Ht 5\' 6"  (1.676 m)   Wt 292 lb (132.5 kg)   LMP 10/19/2011 Comment: pt signed preg test waiver 06/23/17  SpO2 98%   BMI 47.13 kg/m   Patient alert and oriented and in no cardiopulmonary distress.  HEENT: No facial asymmetry, EOMI,   oropharynx pink and moist.  Neck supple no JVD, no mass.No sinus tenderness  Right TM erythematous  Chest: Clear to auscultation bilaterally.  CVS: S1, S2 no murmurs, no S3.Regular rate.  ABD: Soft non tender.   Ext: No edema  MS: Adequate ROM spine, shoulders, hips and knees.  Skin: Intact, no ulcerations or rash noted.  Psych: Good eye contact, normal affect. Memory intact mildly  anxious not  depressed appearing.  CNS: CN 2-12 intact, power,  normal  throughout.no focal deficits noted.   Assessment & Plan  Encounter for examination following treatment at hospital Headache and ear pain referred from dental infection, pt to complete antibiotic course and f/u with periodontist as planned  Vertigo Pt education done and antivert prescribed for as needed use,  Warned against driving  HTN (hypertension) Controlled, no change in medication DASH diet and commitment to daily physical activity for a minimum of 30 minutes discussed and encouraged, as a part of hypertension management. The importance of attaining a healthy weight is also discussed.  BP/Weight 11/13/2017 11/11/2017 08/25/2017 07/07/2017 05/21/2017 08/16/9210 08/13/1739  Systolic BP 814 481 856 - - 314 970  Diastolic BP 80 81 76 - - 82 80  Wt. (Lbs) 292 290 304.25 298.3 304 306 301  BMI 47.13 46.81 49.11 48.15 49.07 49.39 48.58       Morbid obesity Improved, will be postponing surgery , because of current infection, but intends to reschedule Patient re-educated about  the importance of commitment to a  minimum of 150 minutes of exercise per week.  The importance of healthy food choices with portion control discussed. Encouraged to start a food diary, count calories and to consider  joining a support group. Sample diet sheets offered. Goals set by the patient for the next several months.   Weight /BMI 11/13/2017 11/11/2017 08/25/2017  WEIGHT 292 lb 290 lb 304 lb 4 oz  HEIGHT 5\' 6"  5\' 6"  5\' 6"   BMI 47.13 kg/m2 46.81 kg/m2 49.11  kg/m2      Allergic rhinitis Controlled, no current flare of symptoms

## 2017-11-13 NOTE — Patient Instructions (Addendum)
F/u in 3.5 months, call if you need me sooner  You need to take your antibiotic for dental infection at 5 am , 1pm and 9pm  You do niot have a sinus infection  Take BP med at 10 pm per routine  Acute vertigo / inner ear is managed with OTC meclizine/ antivert one every 6 to 8 hrs  Please work on good  health habits so that your health will improve. 1. Commitment to daily physical activity for 30 to 60  minutes, if you are able to do this.  2. Commitment to wise food choices. Aim for half of your  food intake to be vegetable and fruit, one quarter starchy foods, and one quarter protein. Try to eat on a regular schedule  3 meals per day, snacking between meals should be limited to vegetables or fruits or small portions of nuts. 64 ounces of water per day is generally recommended, unless you have specific health conditions, like heart failure or kidney failure where you will need to limit fluid intake.  3. Commitment to sufficient and a  good quality of physical and mental rest daily, generally between 6 to 8 hours per day.  WITH PERSISTANCE AND PERSEVERANCE, THE IMPOSSIBLE , BECOMES THE NORM!  It is important that you exercise regularly at least 30 minutes 5 times a week. If you develop chest pain, have severe difficulty breathing, or feel very tired, stop exercising immediately and seek medical attention

## 2017-11-16 DIAGNOSIS — Z09 Encounter for follow-up examination after completed treatment for conditions other than malignant neoplasm: Secondary | ICD-10-CM | POA: Insufficient documentation

## 2017-11-16 DIAGNOSIS — R42 Dizziness and giddiness: Secondary | ICD-10-CM | POA: Insufficient documentation

## 2017-11-16 NOTE — Assessment & Plan Note (Signed)
Pt education done and antivert prescribed for as needed use,  Warned against driving

## 2017-11-16 NOTE — Assessment & Plan Note (Addendum)
Headache and ear pain referred from dental infection, pt to complete antibiotic course and f/u with periodontist as planned

## 2017-11-16 NOTE — Assessment & Plan Note (Signed)
Controlled, no change in medication DASH diet and commitment to daily physical activity for a minimum of 30 minutes discussed and encouraged, as a part of hypertension management. The importance of attaining a healthy weight is also discussed.  BP/Weight 11/13/2017 11/11/2017 08/25/2017 07/07/2017 05/21/2017 03/11/5685 12/14/8370  Systolic BP 902 111 552 - - 080 223  Diastolic BP 80 81 76 - - 82 80  Wt. (Lbs) 292 290 304.25 298.3 304 306 301  BMI 47.13 46.81 49.11 48.15 49.07 49.39 48.58

## 2017-11-16 NOTE — Assessment & Plan Note (Signed)
Improved, will be postponing surgery , because of current infection, but intends to reschedule Patient re-educated about  the importance of commitment to a  minimum of 150 minutes of exercise per week.  The importance of healthy food choices with portion control discussed. Encouraged to start a food diary, count calories and to consider  joining a support group. Sample diet sheets offered. Goals set by the patient for the next several months.   Weight /BMI 11/13/2017 11/11/2017 08/25/2017  WEIGHT 292 lb 290 lb 304 lb 4 oz  HEIGHT 5\' 6"  5\' 6"  5\' 6"   BMI 47.13 kg/m2 46.81 kg/m2 49.11 kg/m2

## 2017-11-16 NOTE — Assessment & Plan Note (Signed)
Controlled, no current flare of symptoms

## 2017-11-18 ENCOUNTER — Inpatient Hospital Stay (HOSPITAL_COMMUNITY): Admission: RE | Admit: 2017-11-18 | Payer: BC Managed Care – PPO | Source: Ambulatory Visit | Admitting: General Surgery

## 2017-11-18 ENCOUNTER — Encounter (HOSPITAL_COMMUNITY): Admission: RE | Payer: Self-pay | Source: Ambulatory Visit

## 2017-11-18 SURGERY — GASTRECTOMY, SLEEVE, LAPAROSCOPIC
Anesthesia: General

## 2017-12-03 ENCOUNTER — Other Ambulatory Visit: Payer: Self-pay

## 2017-12-03 MED ORDER — MECLIZINE HCL 25 MG PO TABS: 25.0000 mg | ORAL_TABLET | Freq: Three times a day (TID) | ORAL | 0 refills | Status: DC | PRN

## 2017-12-04 ENCOUNTER — Ambulatory Visit: Payer: BC Managed Care – PPO | Admitting: Skilled Nursing Facility1

## 2017-12-23 ENCOUNTER — Encounter: Payer: Self-pay | Admitting: Family Medicine

## 2017-12-23 ENCOUNTER — Ambulatory Visit: Payer: BC Managed Care – PPO | Admitting: Family Medicine

## 2017-12-23 VITALS — BP 150/88 | HR 76 | Temp 98.0°F | Resp 16 | Ht 66.0 in | Wt 286.8 lb

## 2017-12-23 DIAGNOSIS — J40 Bronchitis, not specified as acute or chronic: Secondary | ICD-10-CM | POA: Diagnosis not present

## 2017-12-23 DIAGNOSIS — J00 Acute nasopharyngitis [common cold]: Secondary | ICD-10-CM

## 2017-12-23 DIAGNOSIS — I10 Essential (primary) hypertension: Secondary | ICD-10-CM

## 2017-12-23 MED ORDER — AZITHROMYCIN 250 MG PO TABS: ORAL_TABLET | ORAL | 0 refills | Status: DC

## 2017-12-23 MED ORDER — FLUTICASONE PROPIONATE 50 MCG/ACT NA SUSP: 2.0000 | Freq: Every day | NASAL | 6 refills | Status: DC

## 2017-12-23 MED ORDER — AMLODIPINE BESYLATE 10 MG PO TABS
10.0000 mg | ORAL_TABLET | Freq: Every day | ORAL | 0 refills | Status: DC
Start: 2017-12-23 — End: 2018-03-23

## 2017-12-23 NOTE — Patient Instructions (Signed)
DASH Eating Plan DASH stands for "Dietary Approaches to Stop Hypertension." The DASH eating plan is a healthy eating plan that has been shown to reduce high blood pressure (hypertension). It may also reduce your risk for type 2 diabetes, heart disease, and stroke. The DASH eating plan may also help with weight loss. What are tips for following this plan? General guidelines  Avoid eating more than 2,300 mg (milligrams) of salt (sodium) a day. If you have hypertension, you may need to reduce your sodium intake to 1,500 mg a day.  Limit alcohol intake to no more than 1 drink a day for nonpregnant women and 2 drinks a day for men. One drink equals 12 oz of beer, 5 oz of wine, or 1 oz of hard liquor.  Work with your health care provider to maintain a healthy body weight or to lose weight. Ask what an ideal weight is for you.  Get at least 30 minutes of exercise that causes your heart to beat faster (aerobic exercise) most days of the week. Activities may include walking, swimming, or biking.  Work with your health care provider or diet and nutrition specialist (dietitian) to adjust your eating plan to your individual calorie needs. Reading food labels  Check food labels for the amount of sodium per serving. Choose foods with less than 5 percent of the Daily Value of sodium. Generally, foods with less than 300 mg of sodium per serving fit into this eating plan.  To find whole grains, look for the word "whole" as the first word in the ingredient list. Shopping  Buy products labeled as "low-sodium" or "no salt added."  Buy fresh foods. Avoid canned foods and premade or frozen meals. Cooking  Avoid adding salt when cooking. Use salt-free seasonings or herbs instead of table salt or sea salt. Check with your health care provider or pharmacist before using salt substitutes.  Do not fry foods. Cook foods using healthy methods such as baking, boiling, grilling, and broiling instead.  Cook with  heart-healthy oils, such as olive, canola, soybean, or sunflower oil. Meal planning   Eat a balanced diet that includes: ? 5 or more servings of fruits and vegetables each day. At each meal, try to fill half of your plate with fruits and vegetables. ? Up to 6-8 servings of whole grains each day. ? Less than 6 oz of lean meat, poultry, or fish each day. A 3-oz serving of meat is about the same size as a deck of cards. One egg equals 1 oz. ? 2 servings of low-fat dairy each day. ? A serving of nuts, seeds, or beans 5 times each week. ? Heart-healthy fats. Healthy fats called Omega-3 fatty acids are found in foods such as flaxseeds and coldwater fish, like sardines, salmon, and mackerel.  Limit how much you eat of the following: ? Canned or prepackaged foods. ? Food that is high in trans fat, such as fried foods. ? Food that is high in saturated fat, such as fatty meat. ? Sweets, desserts, sugary drinks, and other foods with added sugar. ? Full-fat dairy products.  Do not salt foods before eating.  Try to eat at least 2 vegetarian meals each week.  Eat more home-cooked food and less restaurant, buffet, and fast food.  When eating at a restaurant, ask that your food be prepared with less salt or no salt, if possible. What foods are recommended? The items listed may not be a complete list. Talk with your dietitian about what   dietary choices are best for you. Grains Whole-grain or whole-wheat bread. Whole-grain or whole-wheat pasta. Brown rice. Oatmeal. Quinoa. Bulgur. Whole-grain and low-sodium cereals. Pita bread. Low-fat, low-sodium crackers. Whole-wheat flour tortillas. Vegetables Fresh or frozen vegetables (raw, steamed, roasted, or grilled). Low-sodium or reduced-sodium tomato and vegetable juice. Low-sodium or reduced-sodium tomato sauce and tomato paste. Low-sodium or reduced-sodium canned vegetables. Fruits All fresh, dried, or frozen fruit. Canned fruit in natural juice (without  added sugar). Meat and other protein foods Skinless chicken or turkey. Ground chicken or turkey. Pork with fat trimmed off. Fish and seafood. Egg whites. Dried beans, peas, or lentils. Unsalted nuts, nut butters, and seeds. Unsalted canned beans. Lean cuts of beef with fat trimmed off. Low-sodium, lean deli meat. Dairy Low-fat (1%) or fat-free (skim) milk. Fat-free, low-fat, or reduced-fat cheeses. Nonfat, low-sodium ricotta or cottage cheese. Low-fat or nonfat yogurt. Low-fat, low-sodium cheese. Fats and oils Soft margarine without trans fats. Vegetable oil. Low-fat, reduced-fat, or light mayonnaise and salad dressings (reduced-sodium). Canola, safflower, olive, soybean, and sunflower oils. Avocado. Seasoning and other foods Herbs. Spices. Seasoning mixes without salt. Unsalted popcorn and pretzels. Fat-free sweets. What foods are not recommended? The items listed may not be a complete list. Talk with your dietitian about what dietary choices are best for you. Grains Baked goods made with fat, such as croissants, muffins, or some breads. Dry pasta or rice meal packs. Vegetables Creamed or fried vegetables. Vegetables in a cheese sauce. Regular canned vegetables (not low-sodium or reduced-sodium). Regular canned tomato sauce and paste (not low-sodium or reduced-sodium). Regular tomato and vegetable juice (not low-sodium or reduced-sodium). Pickles. Olives. Fruits Canned fruit in a light or heavy syrup. Fried fruit. Fruit in cream or butter sauce. Meat and other protein foods Fatty cuts of meat. Ribs. Fried meat. Bacon. Sausage. Bologna and other processed lunch meats. Salami. Fatback. Hotdogs. Bratwurst. Salted nuts and seeds. Canned beans with added salt. Canned or smoked fish. Whole eggs or egg yolks. Chicken or turkey with skin. Dairy Whole or 2% milk, cream, and half-and-half. Whole or full-fat cream cheese. Whole-fat or sweetened yogurt. Full-fat cheese. Nondairy creamers. Whipped toppings.  Processed cheese and cheese spreads. Fats and oils Butter. Stick margarine. Lard. Shortening. Ghee. Bacon fat. Tropical oils, such as coconut, palm kernel, or palm oil. Seasoning and other foods Salted popcorn and pretzels. Onion salt, garlic salt, seasoned salt, table salt, and sea salt. Worcestershire sauce. Tartar sauce. Barbecue sauce. Teriyaki sauce. Soy sauce, including reduced-sodium. Steak sauce. Canned and packaged gravies. Fish sauce. Oyster sauce. Cocktail sauce. Horseradish that you find on the shelf. Ketchup. Mustard. Meat flavorings and tenderizers. Bouillon cubes. Hot sauce and Tabasco sauce. Premade or packaged marinades. Premade or packaged taco seasonings. Relishes. Regular salad dressings. Where to find more information:  National Heart, Lung, and Blood Institute: www.nhlbi.nih.gov  American Heart Association: www.heart.org Summary  The DASH eating plan is a healthy eating plan that has been shown to reduce high blood pressure (hypertension). It may also reduce your risk for type 2 diabetes, heart disease, and stroke.  With the DASH eating plan, you should limit salt (sodium) intake to 2,300 mg a day. If you have hypertension, you may need to reduce your sodium intake to 1,500 mg a day.  When on the DASH eating plan, aim to eat more fresh fruits and vegetables, whole grains, lean proteins, low-fat dairy, and heart-healthy fats.  Work with your health care provider or diet and nutrition specialist (dietitian) to adjust your eating plan to your individual   calorie needs. This information is not intended to replace advice given to you by your health care provider. Make sure you discuss any questions you have with your health care provider. Document Released: 11/14/2011 Document Revised: 11/18/2016 Document Reviewed: 11/18/2016 Elsevier Interactive Patient Education  2018 Elsevier Inc.  

## 2017-12-23 NOTE — Progress Notes (Signed)
Patient ID: [name], female    DOB: 02-14-1961, 57 y.o.   MRN: [id number]  Chief Complaint  Patient presents with  . Cough  . Wheezing    Allergies Sulfamethoxazole-trimethoprim  Subjective:   [name] is a 57 y.o. female who presents to [facility] today.  HPI [name] presents today for an acute visit.  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  She reports that for the past 6 days she has had nasal congestion and cough.  She reports that she got script filled for a questionable infection in her teeth 4 days ago.  The prescription was for amoxicillin.  She is concerned that she could be wheezing.  She reports she does not have asthma.  Has never used an inhaler in the past coughing a lot, but cannot get up sputum.  When she does cough up sputum it is yellow in color.  She reports that she does not feel SOB. Has had chills and low energy. Did not go to work today b/c son is sick. No sinus pressure. Coughing a lot. Ear pain mild.  Hearing normal.. Nausea at times. No vomiting. Drinking fluids.  Has had some subjective fevers.  No prior history of pneumonia.  Also like to discuss her blood pressure medicine.  Reports that she has been on labetalol for the past 9 years since the birth of her son.  Does not feel that it is controlling her blood pressure very well.  Only takes the medication once a day at this point.  Is working on losing weight but believes her blood pressure is been running high.  Denies any chest pain, edema in lower extremities, palpitations.  Does not recall the name of the other medication she has been on for blood pressure control.  She is allergic to sulfa.  She does not smoke.    Past Medical History:  Diagnosis Date  . Hypertension   . Seasonal allergies     Past Surgical History:  Procedure Laterality Date  . COLONOSCOPY N/A 09/15/2014   Procedure: COLONOSCOPY;  Surgeon: Rogene Houston, MD;  Location: [facility];  Service: Endoscopy;  Laterality: N/A;  830  . Cyst removed from buttock  1980's    Family History  Problem Relation Age of Onset  . Diabetes Mother   . Colon cancer Neg Hx      Social History   Socioeconomic History  . Marital status: Married    Spouse name: None  . Number of children: None  . Years of education: None  . Highest education level: None  Social Needs  . Financial resource strain: None  . Food insecurity - worry: None  . Food insecurity - inability: None  . Transportation needs - medical: None  . Transportation needs - non-medical: None  Occupational History  . None  Tobacco Use  . Smoking status: Never Smoker  . Smokeless tobacco: Never Used  Substance and Sexual Activity  . Alcohol use: No  . Drug use: No  . Sexual activity: Yes  Other Topics Concern  . None  Social History Narrative  . None   Current Outpatient Medications on File Prior to Visit  Medication Sig Dispense Refill  . acetaminophen (TYLENOL) 500 MG tablet Take 1,500 mg by mouth every 6 (six) hours as needed for moderate pain or headache.     Marland Kitchen amoxicillin (AMOXIL) 500 MG capsule Take 500 mg by mouth 3 (three)  times daily.    . cetirizine (ZYRTEC) 10 MG tablet Take 10 mg by mouth at bedtime.    . fluticasone (FLONASE) 50 MCG/ACT nasal spray Place 2 sprays into both nostrils daily as needed for allergies or rhinitis.    Marland Kitchen meclizine (ANTIVERT) 25 MG tablet Take 1 tablet (25 mg total) by mouth 3 (three) times daily as needed for dizziness. 30 tablet 0  . Multiple Vitamin (MULTIVITAMIN) tablet Take 1 tablet by mouth daily.      . vitamin C (ASCORBIC ACID) 500 MG tablet Take 500 mg by mouth daily.    Marland Kitchen loratadine (CLARITIN) 10 MG tablet Take 10 mg by mouth daily.    Marland Kitchen Specialty Vitamins Products [facility] CENTURY ENERGY METABOLISM) TABS Take 1 tablet by mouth daily.     No current facility-administered medications on file prior to visit.     Review of Systems  Constitutional: Positive for  fatigue and fever. Negative for appetite change and unexpected weight change.  HENT: Positive for ear pain, postnasal drip and rhinorrhea. Negative for ear discharge, hearing loss, mouth sores, sinus pressure, trouble swallowing and voice change.   Eyes: Negative for visual disturbance.  Respiratory: Positive for cough. Negative for chest tightness, shortness of breath and wheezing.   Gastrointestinal: Negative for abdominal pain, diarrhea and vomiting.  Genitourinary: Negative for dysuria, frequency and urgency.  Musculoskeletal: Negative for arthralgias and myalgias.  Allergic/Immunologic: Negative for food allergies.  Neurological: Negative for syncope, numbness and headaches.  Hematological: Negative for adenopathy.  Psychiatric/Behavioral: Negative for dysphoric mood.     Objective:   BP (!) 150/88 (BP Location: Left Arm, Patient Position: Sitting, Cuff Size: Normal)   Pulse 76   Temp 98 F (36.7 C) (Temporal)   Resp 16   Ht 5\' 6"  (1.676 m)   Wt 286 lb 12 oz (130.1 kg)   LMP 10/19/2011 Comment: pt signed preg test waiver 06/23/17  SpO2 97%   BMI 46.28 kg/m   Physical Exam  Constitutional: She is oriented to person, place, and time. She appears well-developed and well-nourished. No distress.  HENT:  Head: Normocephalic and atraumatic.  Right Ear: External ear normal.  Left Ear: External ear normal.  Nose: Mucosal edema present. No sinus tenderness, nasal deformity or septal deviation.  No foreign bodies. Right sinus exhibits no maxillary sinus tenderness and no frontal sinus tenderness. Left sinus exhibits no maxillary sinus tenderness and no frontal sinus tenderness.  Mouth/Throat: Uvula is midline and oropharynx is clear and moist. No dental abscesses. No oropharyngeal exudate, posterior oropharyngeal edema or posterior oropharyngeal erythema.  Turbinate hyperemia and edema bilaterally.  Tympanic membranes clear bilaterally.  No lesions in oral cavity.  Moist mucous  membranes.  Eyes: Conjunctivae are normal. Pupils are equal, round, and reactive to light. No scleral icterus.  Neck: Trachea normal, normal range of motion and phonation normal. Neck supple. No JVD present. No tracheal deviation present. No thyroid mass and no thyromegaly present.  Submandibular glands tender to palpation bilaterally but no enlargement.  Cardiovascular: Normal rate, regular rhythm and normal heart sounds.  Pulmonary/Chest: Effort normal and breath sounds normal. No respiratory distress. She has no wheezes.  Lymphadenopathy:    She has no cervical adenopathy.  Neurological: She is alert and oriented to person, place, and time. No cranial nerve deficit.  Skin: Skin is warm and dry.  Psychiatric: She has a normal mood and affect. Her behavior is normal. Thought content normal.  Nursing note and vitals reviewed.    Assessment  and Plan  1. Acute rhinitis Patient told secondary to blood pressure she does not be using any Sudafed or nasal decongestant sprays.  At this time will try Flonase nasal spray as directed.  She was counseled that she needs to use his medication consistently to get results.  She was instructed on administration of medication. - fluticasone (FLONASE) 50 MCG/ACT nasal spray; Place 2 sprays into both nostrils daily.  Dispense: 16 g; Refill: 6  2. Essential hypertension DC labetalol at this time.  Start amlodipine as directed.  Follow-up in 1 month with PCP for blood pressure check.  Diet, exercise, and weight loss recommended. - amLODipine (NORVASC) 10 MG tablet; Take 1 tablet (10 mg total) by mouth daily.  Dispense: 90 tablet; Refill: 0 We did discuss that amlodipine could cause lower extremity edema due to its mechanism of action.-However, patient does not feel she gets good blood pressure control with labetalol and believes it causes her fatigue.  We discussed that she will need to follow-up in 1 month for recheck of blood pressure.  Also recommended at that  time that she get baseline labs for hypertension. 3. Bronchitis Discontinue amoxicillin at this time.  Start Zithromax as directed.  Patient was counseled concerning worrisome signs and symptoms and if develop call or return to clinic or go to emergency department for help.  Delsym over-the-counter as needed. - azithromycin (ZITHROMAX) 250 MG tablet; Take two pills for the first day and one pill a day for four days.  Dispense: 6 tablet; Refill: 0 I did discuss with patient that she was not wheezing but had upper airway noise secondary to her nasal congestion.  She voiced understanding.  Return in about 4 weeks (around 01/20/2018), or Or sooner if needed., for Blood pressure check. Caren Macadam, MD 12/24/2017

## 2017-12-26 ENCOUNTER — Telehealth: Payer: Self-pay | Admitting: Family Medicine

## 2017-12-26 ENCOUNTER — Ambulatory Visit (HOSPITAL_COMMUNITY)
Admission: RE | Admit: 2017-12-26 | Discharge: 2017-12-26 | Disposition: A | Discharge status: Home / Self Care | Payer: BC Managed Care – PPO | Source: Ambulatory Visit | Attending: Family Medicine | Admitting: Family Medicine

## 2017-12-26 DIAGNOSIS — Z1231 Encounter for screening mammogram for malignant neoplasm of breast: Secondary | ICD-10-CM | POA: Diagnosis present

## 2017-12-26 NOTE — Telephone Encounter (Signed)
Patient called in stating she was returning a call, I spoke with breanna about previous msg and she is waiting to discuss patients symptoms (wheezing & coughing) with dr.hagler. She will return patients call

## 2017-12-26 NOTE — Telephone Encounter (Signed)
We need to cxl one of the appts in March for the pt --left her a message

## 2017-12-26 NOTE — Telephone Encounter (Signed)
Called patient regarding message below. No answer, unable to leave message.  

## 2017-12-26 NOTE — Telephone Encounter (Signed)
Pt is calling in to advise she seen Dr Mannie Stabile on Tuesday, and she told her if she wasn't feeling better to call back, she is not feeling any better. Please call

## 2017-12-26 NOTE — Telephone Encounter (Signed)
Previous telephone message sent to Dr. Mannie Stabile for advice.

## 2017-12-26 NOTE — Telephone Encounter (Signed)
Patient called back and informed Dusty that she is having coughing and wheezing.

## 2017-12-26 NOTE — Telephone Encounter (Signed)
It has only bee three days since her last visit. She is still on antibiotics. However,  If she is worse or having difficulty breathing then she needs to be seen and evaluated. Needs to follow up.

## 2018-02-24 ENCOUNTER — Ambulatory Visit: Payer: BC Managed Care – PPO | Admitting: Family Medicine

## 2018-03-03 ENCOUNTER — Ambulatory Visit: Payer: BC Managed Care – PPO | Admitting: Family Medicine

## 2018-03-23 ENCOUNTER — Other Ambulatory Visit: Payer: Self-pay

## 2018-03-23 ENCOUNTER — Telehealth: Payer: Self-pay | Admitting: Family Medicine

## 2018-03-23 DIAGNOSIS — I10 Essential (primary) hypertension: Secondary | ICD-10-CM

## 2018-03-23 MED ORDER — AMLODIPINE BESYLATE 10 MG PO TABS: 10.0000 mg | ORAL_TABLET | Freq: Every day | ORAL | 1 refills | Status: DC

## 2018-03-23 NOTE — Telephone Encounter (Signed)
Needs BP meds refilled,

## 2018-03-23 NOTE — Telephone Encounter (Signed)
Med refilled.

## 2018-04-06 ENCOUNTER — Ambulatory Visit: Payer: BC Managed Care – PPO | Admitting: Family Medicine

## 2018-05-12 ENCOUNTER — Ambulatory Visit: Payer: BC Managed Care – PPO | Admitting: Family Medicine

## 2018-05-28 ENCOUNTER — Ambulatory Visit: Payer: BC Managed Care – PPO | Admitting: Family Medicine

## 2018-06-22 ENCOUNTER — Other Ambulatory Visit: Payer: Self-pay

## 2018-06-22 ENCOUNTER — Encounter: Payer: Self-pay | Admitting: Family Medicine

## 2018-06-22 ENCOUNTER — Ambulatory Visit: Payer: BC Managed Care – PPO | Admitting: Family Medicine

## 2018-06-22 VITALS — BP 126/80 | HR 76 | Resp 12 | Ht 66.0 in | Wt 298.0 lb

## 2018-06-22 DIAGNOSIS — R42 Dizziness and giddiness: Secondary | ICD-10-CM | POA: Diagnosis not present

## 2018-06-22 DIAGNOSIS — J3089 Other allergic rhinitis: Secondary | ICD-10-CM | POA: Diagnosis not present

## 2018-06-22 DIAGNOSIS — E049 Nontoxic goiter, unspecified: Secondary | ICD-10-CM | POA: Diagnosis not present

## 2018-06-22 DIAGNOSIS — E785 Hyperlipidemia, unspecified: Secondary | ICD-10-CM

## 2018-06-22 DIAGNOSIS — I10 Essential (primary) hypertension: Secondary | ICD-10-CM | POA: Diagnosis not present

## 2018-06-22 MED ORDER — MECLIZINE HCL 25 MG PO TABS
25.0000 mg | ORAL_TABLET | Freq: Three times a day (TID) | ORAL | 0 refills | Status: DC | PRN
Start: 2018-06-22 — End: 2019-04-01

## 2018-06-22 MED ORDER — FLUTICASONE PROPIONATE 50 MCG/ACT NA SUSP: 2.0000 | Freq: Every day | NASAL | 3 refills | Status: DC

## 2018-06-22 MED ORDER — PHENTERMINE HCL 37.5 MG PO TABS: 37.5000 mg | ORAL_TABLET | Freq: Every day | ORAL | 1 refills | Status: DC

## 2018-06-22 MED ORDER — AMLODIPINE BESYLATE 10 MG PO TABS
10.0000 mg | ORAL_TABLET | Freq: Every day | ORAL | 3 refills | Status: DC
Start: 2018-06-22 — End: 2019-07-16

## 2018-06-22 NOTE — Patient Instructions (Addendum)
F//U in 4 months, call if  You need me sooner physical activity of 30 minutes total each day is the goal,  Weight loss goal of 5 pounds per month,  Take half phentermine not one  Look into weight watchers  Labs lipid, cmp and EGFr, hBa1C, tSH and vit D as soon as possible Continue current medications, your blood pressure is excellent

## 2018-06-23 LAB — COMPLETE METABOLIC PANEL WITH GFR
AG RATIO: 1.5 (calc) (ref 1.0–2.5)
ALBUMIN MSPROF: 4 g/dL (ref 3.6–5.1)
ALT: 11 U/L (ref 6–29)
AST: 15 U/L (ref 10–35)
Alkaline phosphatase (APISO): 88 U/L (ref 33–130)
BILIRUBIN TOTAL: 0.3 mg/dL (ref 0.2–1.2)
BUN: 14 mg/dL (ref 7–25)
CHLORIDE: 105 mmol/L (ref 98–110)
CO2: 30 mmol/L (ref 20–32)
Calcium: 9.4 mg/dL (ref 8.6–10.4)
Creat: 0.71 mg/dL (ref 0.50–1.05)
GFR, EST AFRICAN AMERICAN: 111 mL/min/{1.73_m2} (ref >=60)
GFR, Est Non African American: 96 mL/min/{1.73_m2} (ref >=60)
GLOBULIN: 2.7 g/dL (ref 1.9–3.7)
Glucose, Bld: 97 mg/dL (ref 65–139)
POTASSIUM: 4.3 mmol/L (ref 3.5–5.3)
Sodium: 141 mmol/L (ref 135–146)
TOTAL PROTEIN: 6.7 g/dL (ref 6.1–8.1)

## 2018-06-23 LAB — LIPID PANEL
Cholesterol: 172 mg/dL (ref <200)
HDL: 57 mg/dL (ref >50)
LDL Cholesterol (Calc): 99 mg/dL (calc)
NON-HDL CHOLESTEROL (CALC): 115 mg/dL (ref <130)
TRIGLYCERIDES: 74 mg/dL (ref <150)
Total CHOL/HDL Ratio: 3 (calc) (ref <5.0)

## 2018-06-23 LAB — HEMOGLOBIN A1C
HEMOGLOBIN A1C: 5.1 %{Hb} (ref <5.7)
Mean Plasma Glucose: 100 (calc)
eAG (mmol/L): 5.5 (calc)

## 2018-06-23 LAB — TSH: TSH: 1.22 mIU/L

## 2018-06-23 LAB — VITAMIN D 25 HYDROXY (VIT D DEFICIENCY, FRACTURES): Vit D, 25-Hydroxy: 25 ng/mL — ABNORMAL LOW (ref 30–100)

## 2018-06-25 ENCOUNTER — Encounter: Payer: Self-pay | Admitting: Family Medicine

## 2018-06-26 ENCOUNTER — Encounter: Payer: Self-pay | Admitting: Family Medicine

## 2018-06-28 ENCOUNTER — Encounter: Payer: Self-pay | Admitting: Family Medicine

## 2018-06-28 NOTE — Assessment & Plan Note (Signed)
Flonase and zyrtec to be used daily

## 2018-06-28 NOTE — Assessment & Plan Note (Signed)
Repeat ultrasound indicated, will order as follow up

## 2018-06-28 NOTE — Assessment & Plan Note (Signed)
Deteriorated. Patient re-educated about  the importance of commitment to a  minimum of 150 minutes of exercise per week.  The importance of healthy food choices with portion control discussed. Encouraged to start a food diary, count calories and to consider  joining a support group. Sample diet sheets offered. Goals set by the patient for the next several months.   Weight /BMI 06/22/2018 12/23/2017 11/13/2017  WEIGHT 298 lb 0.6 oz 286 lb 12 oz 292 lb  HEIGHT 5\' 6"  5\' 6"  5\' 6"   BMI 48.1 kg/m2 46.28 kg/m2 47.13 kg/m2   Start half phentermine daily

## 2018-06-28 NOTE — Assessment & Plan Note (Signed)
Controlled, no change in medication DASH diet and commitment to daily physical activity for a minimum of 30 minutes discussed and encouraged, as a part of hypertension management. The importance of attaining a healthy weight is also discussed.  BP/Weight 06/22/2018 12/23/2017 11/13/2017 11/11/2017 08/25/2017 07/07/2017 2/55/0016  Systolic BP 429 037 955 831 674 - -  Diastolic BP 80 88 80 81 76 - -  Wt. (Lbs) 298.04 286.75 292 290 304.25 298.3 304  BMI 48.1 46.28 47.13 46.81 49.11 48.15 49.07

## 2018-06-28 NOTE — Assessment & Plan Note (Signed)
Intermittent episodes, Antivert prescribed for as needed use, states more effective and less sedating than dramamine

## 2018-06-28 NOTE — Progress Notes (Signed)
   [name]     MRN: [id number]      DOB: 12-04-1961   HPI Ms. Morganti is here for follow up and re-evaluation of chronic medical conditions, medication management and review of any available recent lab and radiology data.  The PT denies any adverse reactions to current medications since the last visit.  She has been changed from labetalol to amlodipine  For management of her blood prssure and is doing very well with this No adverse side effects and more convenient dosing She recently had sinusitis and vertigo and this has resolved over time Has successfully completed her Masters degree and is now ready to focus on weight loss. Requests phentermine, uncertain about surgery   ROS Denies recent fever or chills. Denies sinus pressure, nasal congestion, ear pain or sore throat. Denies chest congestion, productive cough or wheezing. Denies chest pains, palpitations and leg swelling Denies abdominal pain, nausea, vomiting,diarrhea or constipation.   Denies dysuria, frequency, hesitancy or incontinence. Denies joint pain, swelling and limitation in mobility. Denies headaches, seizures, numbness, or tingling. Denies depression, anxiety or insomnia. Denies skin break down or rash.   PE  BP 126/80 (BP Location: Left Arm, Patient Position: Sitting, Cuff Size: Large)   Pulse 76   Resp 12   Ht 5\' 6"  (1.676 m)   Wt 298 lb 0.6 oz (135.2 kg)   LMP 10/19/2011 Comment: pt signed preg test waiver 06/23/17  SpO2 96%   BMI 48.10 kg/m   Patient alert and oriented and in no cardiopulmonary distress.  HEENT: No facial asymmetry, EOMI,   oropharynx pink and moist.  Neck supple no JVD, goiter  Chest: Clear to auscultation bilaterally.  CVS: S1, S2 no murmurs, no S3.Regular rate.  ABD: Soft non tender.   Ext: No edema  MS: Adequate ROM spine, shoulders, hips and knees.  Skin: Intact, no ulcerations or rash noted.  Psych: Good eye contact, normal affect. Memory intact not anxious or depressed  appearing.  CNS: CN 2-12 intact, power,  normal throughout.no focal deficits noted.   Assessment & Plan  HTN (hypertension) Controlled, no change in medication DASH diet and commitment to daily physical activity for a minimum of 30 minutes discussed and encouraged, as a part of hypertension management. The importance of attaining a healthy weight is also discussed.  BP/Weight 06/22/2018 12/23/2017 11/13/2017 11/11/2017 08/25/2017 07/07/2017 0/86/5784  Systolic BP 696 295 284 132 440 - -  Diastolic BP 80 88 80 81 76 - -  Wt. (Lbs) 298.04 286.75 292 290 304.25 298.3 304  BMI 48.1 46.28 47.13 46.81 49.11 48.15 49.07       Morbid obesity Deteriorated. Patient re-educated about  the importance of commitment to a  minimum of 150 minutes of exercise per week.  The importance of healthy food choices with portion control discussed. Encouraged to start a food diary, count calories and to consider  joining a support group. Sample diet sheets offered. Goals set by the patient for the next several months.   Weight /BMI 06/22/2018 12/23/2017 11/13/2017  WEIGHT 298 lb 0.6 oz 286 lb 12 oz 292 lb  HEIGHT 5\' 6"  5\' 6"  5\' 6"   BMI 48.1 kg/m2 46.28 kg/m2 47.13 kg/m2   Start half phentermine daily  Vertigo Intermittent episodes, Antivert prescribed for as needed use, states more effective and less sedating than dramamine  Allergic rhinitis Flonase and zyrtec to be used daily  Goiter Repeat ultrasound indicated, will order as follow up

## 2018-06-30 ENCOUNTER — Other Ambulatory Visit: Payer: Self-pay | Admitting: Family Medicine

## 2018-06-30 MED ORDER — AZITHROMYCIN 250 MG PO TABS: ORAL_TABLET | ORAL | 0 refills | Status: DC

## 2018-06-30 MED ORDER — FLUCONAZOLE 150 MG PO TABS: 150.0000 mg | ORAL_TABLET | Freq: Once | ORAL | 0 refills | Status: AC

## 2018-07-16 ENCOUNTER — Ambulatory Visit (HOSPITAL_COMMUNITY): Payer: BC Managed Care – PPO

## 2018-07-21 DIAGNOSIS — M1712 Unilateral primary osteoarthritis, left knee: Secondary | ICD-10-CM | POA: Insufficient documentation

## 2018-07-24 ENCOUNTER — Ambulatory Visit (HOSPITAL_COMMUNITY): Payer: BC Managed Care – PPO

## 2018-07-31 ENCOUNTER — Encounter (HOSPITAL_COMMUNITY): Payer: Self-pay

## 2018-07-31 ENCOUNTER — Ambulatory Visit (HOSPITAL_COMMUNITY): Admission: RE | Admit: 2018-07-31 | Payer: BC Managed Care – PPO | Source: Ambulatory Visit

## 2018-10-13 ENCOUNTER — Ambulatory Visit: Payer: BC Managed Care – PPO | Admitting: Family Medicine

## 2018-10-13 ENCOUNTER — Encounter: Payer: Self-pay | Admitting: Family Medicine

## 2018-10-13 VITALS — BP 130/82 | HR 95 | Resp 16 | Ht 66.0 in | Wt 290.0 lb

## 2018-10-13 DIAGNOSIS — M25511 Pain in right shoulder: Secondary | ICD-10-CM | POA: Diagnosis not present

## 2018-10-13 DIAGNOSIS — M898X1 Other specified disorders of bone, shoulder: Secondary | ICD-10-CM

## 2018-10-13 DIAGNOSIS — K219 Gastro-esophageal reflux disease without esophagitis: Secondary | ICD-10-CM

## 2018-10-13 DIAGNOSIS — I10 Essential (primary) hypertension: Secondary | ICD-10-CM | POA: Diagnosis not present

## 2018-10-13 MED ORDER — ONDANSETRON HCL 4 MG PO TABS: 4.0000 mg | ORAL_TABLET | Freq: Three times a day (TID) | ORAL | 0 refills | Status: DC | PRN

## 2018-10-13 NOTE — Patient Instructions (Addendum)
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 The TMJs are located near your ears and allow your jaw to open and close. These joints and the nearby muscles are involved in all movements of the jaw. People with TMJ syndrome have pain in the area of these joints and muscles. Chewing, biting, or other movements of the jaw can be difficult or painful. TMJ syndrome can be caused by various things. In many cases, the condition is mild and goes away within a few weeks. For some people, the condition can become a long-term problem. What are the causes? Possible causes of TMJ syndrome include:  Grinding your teeth or clenching your jaw. Some people do this when they are under stress.  Arthritis.  Injury to the jaw.  Head or neck injury.  Teeth or dentures that are not aligned well.  In some cases, the cause of TMJ syndrome may not be known. What are the signs or symptoms? The most common symptom is an aching pain on the side of the head in the area of the TMJ. Other symptoms may include:  Pain when moving your jaw, such as when chewing or biting.  Being unable to open your jaw all the way.  Making a clicking sound when you open your mouth.  Headache.  Earache.  Neck or shoulder pain.  How is this diagnosed? Diagnosis can  usually be made based on your symptoms, your medical history, and a physical exam. Your health care provider may check the range of motion of your jaw. Imaging tests, such as X-rays or an MRI, are sometimes done. You may need to see your dentist to determine if your teeth and jaw are lined up correctly. How is this treated? TMJ syndrome often goes away on its own. If treatment is needed, the options may include:  Eating soft foods and applying ice or heat.  Medicines to relieve pain or inflammation.  Medicines to relax the muscles.  A splint, bite plate, or mouthpiece to prevent teeth grinding or jaw clenching.  Relaxation techniques or counseling to help reduce stress.  Transcutaneous electrical nerve stimulation (TENS). This helps to relieve pain by applying an electrical current through the skin.  Acupuncture. This is sometimes helpful to relieve pain.  Jaw surgery. This is rarely needed.  Follow these instructions at home:  Take medicines only as directed by your health care provider.  Eat a soft diet if you are having trouble chewing.  Apply ice to the painful area. ? Put ice in a plastic bag. ? Place a towel between your skin and the bag. ? Leave the ice on for 20 minutes, 2-3 times a day.  Apply a warm compress to the painful area as directed.  Massage your jaw area and perform  any jaw stretching exercises as recommended by your health care provider.  If you were given a mouthpiece or bite plate, wear it as directed.  Avoid foods that require a lot of chewing. Do not chew gum.  Keep all follow-up visits as directed by your health care provider. This is important. Contact a health care provider if:  You are having trouble eating.  You have new or worsening symptoms. Get help right away if:  Your jaw locks open or closed. This information is not intended to replace advice given to you by your health care provider. Make sure you discuss any questions you have with  your health care provider. Document Released: 08/20/2001 Document Revised: 07/25/2016 Document Reviewed: 06/30/2014 Elsevier Interactive Patient Education  [employer].

## 2018-10-13 NOTE — Progress Notes (Signed)
[name]     MRN: [id number]      DOB: 07-Feb-1961   HPI [name] is here for follow up and re-evaluation of chronic medical conditions, medication management and review of any available recent lab and radiology data.  Preventive health is updated, specifically  Cancer screening and Immunization.     Right shoulder pain with some limitation of placing RUE behind the head started approx 5 months ago. Rated at a 4 at the worst. States during Sumnmer months when she had very little overuse of the shoulder as in carrying a satchel and asssi9sting lifting child from wheelcharir she had  ni pain, and full ROM Notes raised / pronminenet right collar bone for several years, thinks may have enlarged and is concerned  Nausea x 3 days , no vomit, no change in stool, first indicent, does have heartburn esp with spicy foods Working on weight loss through dietary change with good result  ROS Denies recent fever or chills. Denies sinus pressure, nasal congestion, ear pain or sore throat. Denies chest congestion, productive cough or wheezing. Denies chest pains, palpitations and leg swelling Denies , vomiting,diarrhea or constipation.   Denies dysuria, frequency, hesitancy or incontinence.  Denies headaches, seizures, numbness, or tingling. Denies depression, anxiety or insomnia. Denies skin break down or rash.   PE  BP 130/82   Pulse 95   Resp 16   Ht 5\' 6"  (1.676 m)   Wt 290 lb (131.5 kg)   LMP 10/19/2011 Comment: pt signed preg test waiver 06/23/17  SpO2 96%   BMI 46.81 kg/m   Patient alert and oriented and in no cardiopulmonary distress.  HEENT: No facial asymmetry, EOMI,   oropharynx pink and moist.  Neck supple no JVD, no mass.slight pro minence of right clavicle  Chest: Clear to auscultation bilaterally.  CVS: S1, S2 no murmurs, no S3.Regular rate.  ABD: Soft non tender.   Ext: No edema  MS: Adequate ROM spine, , hips and knees.Decreased ROM right shoulder with tenderness  to palpation  Skin: Intact, no ulcerations or rash noted.  Psych: Good eye contact, normal affect. Memory intact not anxious or depressed appearing.  CNS: CN 2-12 intact, power,  normal throughout.no focal deficits noted.   Assessment & Plan  Chronic right shoulder pain Xr ay reports mild arthritis. Pt cautioned to reduce weight se lifts regularly and pushes, tylenol for pain and regular passive exercise  HTN (hypertension) Controlled, no change in medication DASH diet and commitment to daily physical activity for a minimum of 30 minutes discussed and encouraged, as a part of hypertension management. The importance of attaining a healthy weight is also discussed.  BP/Weight 10/13/2018 06/22/2018 12/23/2017 11/13/2017 11/11/2017 08/25/2017 5/62/1308  Systolic BP 657 846 962 952 841 324 -  Diastolic BP 82 80 88 80 81 76 -  Wt. (Lbs) 290 298.04 286.75 292 290 304.25 298.3  BMI 46.81 48.1 46.28 47.13 46.81 49.11 48.15       Morbid obesity Improved Patient re-educated about  the importance of commitment to a  minimum of 150 minutes of exercise per week.  The importance of healthy food choices with portion control discussed. Encouraged to start a food diary, count calories and to consider  joining a support group. Sample diet sheets offered. Goals set by the patient for the next several months.   Weight /BMI 10/13/2018 06/22/2018 12/23/2017  WEIGHT 290 lb 298 lb 0.6 oz 286 lb 12 oz  HEIGHT 5\' 6"  5\' 6"  5'  6"  BMI 46.81 kg/m2 48.1 kg/m2 46.28 kg/m2      Pain of right clavicle Pain and swelling of right clavicles, pt concerned that looks enlarged, assured her likelihood of abnormality in bone is minor, will obtain x ray  GERD Increased nausea and dyspepsia, check H pylori breath test. Zofran for nausea as needed. If persists ,  She will need gall bladder evaluation

## 2018-10-15 ENCOUNTER — Encounter: Payer: Self-pay | Admitting: Family Medicine

## 2018-10-15 LAB — H. PYLORI BREATH TEST: H. PYLORI BREATH TEST: NOT DETECTED

## 2018-10-16 ENCOUNTER — Ambulatory Visit (HOSPITAL_COMMUNITY)
Admission: RE | Admit: 2018-10-16 | Discharge: 2018-10-16 | Disposition: A | Discharge status: Home / Self Care | Payer: BC Managed Care – PPO | Source: Ambulatory Visit | Attending: Family Medicine | Admitting: Family Medicine

## 2018-10-16 DIAGNOSIS — M898X1 Other specified disorders of bone, shoulder: Secondary | ICD-10-CM | POA: Insufficient documentation

## 2018-10-16 DIAGNOSIS — E042 Nontoxic multinodular goiter: Secondary | ICD-10-CM | POA: Diagnosis not present

## 2018-10-16 DIAGNOSIS — E049 Nontoxic goiter, unspecified: Secondary | ICD-10-CM

## 2018-10-16 DIAGNOSIS — M19011 Primary osteoarthritis, right shoulder: Secondary | ICD-10-CM | POA: Insufficient documentation

## 2018-10-16 DIAGNOSIS — M25511 Pain in right shoulder: Secondary | ICD-10-CM | POA: Insufficient documentation

## 2018-10-16 DIAGNOSIS — M779 Enthesopathy, unspecified: Secondary | ICD-10-CM | POA: Diagnosis not present

## 2018-10-17 ENCOUNTER — Encounter: Payer: Self-pay | Admitting: Family Medicine

## 2018-10-17 DIAGNOSIS — G8929 Other chronic pain: Secondary | ICD-10-CM | POA: Insufficient documentation

## 2018-10-17 DIAGNOSIS — M25511 Pain in right shoulder: Secondary | ICD-10-CM

## 2018-10-17 DIAGNOSIS — M898X1 Other specified disorders of bone, shoulder: Secondary | ICD-10-CM

## 2018-10-17 HISTORY — DX: Other specified disorders of bone, shoulder: M89.8X1

## 2018-10-17 NOTE — Assessment & Plan Note (Signed)
Xr ay reports mild arthritis. Pt cautioned to reduce weight se lifts regularly and pushes, tylenol for pain and regular passive exercise

## 2018-10-17 NOTE — Assessment & Plan Note (Signed)
Improved Patient re-educated about  the importance of commitment to a  minimum of 150 minutes of exercise per week.  The importance of healthy food choices with portion control discussed. Encouraged to start a food diary, count calories and to consider  joining a support group. Sample diet sheets offered. Goals set by the patient for the next several months.   Weight /BMI 10/13/2018 06/22/2018 12/23/2017  WEIGHT 290 lb 298 lb 0.6 oz 286 lb 12 oz  HEIGHT 5\' 6"  5\' 6"  5\' 6"   BMI 46.81 kg/m2 48.1 kg/m2 46.28 kg/m2

## 2018-10-17 NOTE — Assessment & Plan Note (Signed)
Pain and swelling of right clavicles, pt concerned that looks enlarged, assured her likelihood of abnormality in bone is minor, will obtain x ray

## 2018-10-17 NOTE — Assessment & Plan Note (Signed)
Controlled, no change in medication DASH diet and commitment to daily physical activity for a minimum of 30 minutes discussed and encouraged, as a part of hypertension management. The importance of attaining a healthy weight is also discussed.  BP/Weight 10/13/2018 06/22/2018 12/23/2017 11/13/2017 11/11/2017 08/25/2017 2/29/7989  Systolic BP 211 941 740 814 481 856 -  Diastolic BP 82 80 88 80 81 76 -  Wt. (Lbs) 290 298.04 286.75 292 290 304.25 298.3  BMI 46.81 48.1 46.28 47.13 46.81 49.11 48.15

## 2018-10-17 NOTE — Assessment & Plan Note (Signed)
Increased nausea and dyspepsia, check H pylori breath test. Zofran for nausea as needed. If persists ,  She will need gall bladder evaluation

## 2018-10-27 ENCOUNTER — Ambulatory Visit: Payer: BC Managed Care – PPO | Admitting: Family Medicine

## 2018-11-29 ENCOUNTER — Other Ambulatory Visit: Payer: Self-pay | Admitting: Family Medicine

## 2019-02-11 ENCOUNTER — Ambulatory Visit: Payer: BC Managed Care – PPO | Admitting: Family Medicine

## 2019-02-16 ENCOUNTER — Ambulatory Visit: Payer: BC Managed Care – PPO | Admitting: Family Medicine

## 2019-04-01 ENCOUNTER — Encounter: Payer: Self-pay | Admitting: Family Medicine

## 2019-04-01 ENCOUNTER — Ambulatory Visit (INDEPENDENT_AMBULATORY_CARE_PROVIDER_SITE_OTHER): Payer: BC Managed Care – PPO | Admitting: Family Medicine

## 2019-04-01 ENCOUNTER — Other Ambulatory Visit: Payer: Self-pay

## 2019-04-01 VITALS — BP 130/82 | HR 95 | Ht 66.0 in | Wt 265.0 lb

## 2019-04-01 DIAGNOSIS — R202 Paresthesia of skin: Secondary | ICD-10-CM | POA: Diagnosis not present

## 2019-04-01 DIAGNOSIS — R9402 Abnormal brain scan: Secondary | ICD-10-CM | POA: Diagnosis not present

## 2019-04-01 DIAGNOSIS — J3089 Other allergic rhinitis: Secondary | ICD-10-CM

## 2019-04-01 DIAGNOSIS — I10 Essential (primary) hypertension: Secondary | ICD-10-CM | POA: Diagnosis not present

## 2019-04-01 DIAGNOSIS — E0789 Other specified disorders of thyroid: Secondary | ICD-10-CM

## 2019-04-01 MED ORDER — FLUTICASONE PROPIONATE 50 MCG/ACT NA SUSP: 2.0000 | Freq: Every day | NASAL | 3 refills | Status: DC

## 2019-04-01 NOTE — Progress Notes (Signed)
Virtual Visit via Telephone Note  I connected with [name] on 04/01/19 at  1:00 PM EDT by telephone and verified that I am speaking with the correct person using two identifiers.   I discussed the limitations, risks, security and privacy concerns of performing an evaluation and management service by telephone and the availability of in person appointments. I also discussed with the patient that there may be a patient responsible charge related to this service. The patient expressed understanding and agreed to proceed.pt in her home and I am in my home , webex attempted    History of Present Illness:  1 month h/o tingling in right index and middle finger tip and numbness and pins a nd needles in right hand and arm. Does not awaken pt, occurs primarily when at computer working, feels needle pricking as far up as the neck. 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 Has altered her diet over the past several months, and is reporting excellent weight loss, states that her exercise needs to be increased to facilitate improved weight loss and health and wellbeing, and she is fully commited Review of Systems  Constitutional: Negative for chills and fever.  HENT: Negative for congestion and sinus pain.   Respiratory: Negative for cough and shortness of breath.   Cardiovascular: Negative for chest pain, palpitations and leg swelling.  Neurological: Positive for sensory change. Negative for focal weakness, seizures and headaches.  Psychiatric/Behavioral: Negative for depression. The patient is nervous/anxious.        Increased work stress with pandemic    Increased allergy symptoms as ecxpected at  this time of the year, responds to prescription medication and this is sent in    Observations/Objective: BP 130/82   Pulse 95   Ht 5\' 6"  (1.676 m)   Wt 265 lb (120.2 kg)   LMP 10/19/2011 Comment: pt signed preg test waiver 06/23/17  BMI 42.77 kg/m    Assessment and Plan: Abnormal brain scan Reports new tingling in thighs and RUE symptoms with abnormal brain scan , needs Neurology evaluation , this is discussed with [name] and she is in agreement  HTN (hypertension) Controlled, no change in medication DASH diet and commitment to daily physical activity for a minimum of 30 minutes discussed and encouraged, as a part of hypertension management. The importance of attaining a healthy weight is also discussed.  BP/Weight 04/01/2019 10/13/2018 06/22/2018 12/23/2017 11/13/2017 11/11/2017 1/61/0960  Systolic BP 454 098 119 147 829 562 130  Diastolic BP 82 82 80 88 80 81 76  Wt. (Lbs) 265 290 298.04 286.75 292 290 304.25  BMI 42.77 46.81 48.1 46.28 47.13 46.81 49.11       Morbid obesity Improved with dietary change, she is applauded on this, and encouraged to continue same Needs updated labs which are overdiue  Patient re-educated about  the importance of commitment to a  minimum of 150 minutes of exercise per week as able.  The importance of healthy food choices with portion control discussed, as well as eating regularly and within a 12 hour window most days. The need to choose "clean , green" food 50 to 75% of the time is  discussed, as well as to make water the primary drink and set a goal of 64 ounces water daily.  Encouraged to start a food diary,  and to consider  joining a support group. Sample diet sheets offered. Goals set by the patient for the next several months.   Weight /BMI 04/01/2019 10/13/2018 06/22/2018  WEIGHT 265 lb 290 lb 298 lb 0.6 oz  HEIGHT 5\' 6"  5\' 6"  5\' 6"   BMI 42.77 kg/m2 46.81 kg/m2 48.1 kg/m2      Thyroid mass of unclear etiology Repeat imaging  indicated, will need to contact patient about this    Follow Up Instructions:    I discussed the assessment and treatment plan with the patient. The patient was provided an opportunity to ask questions and all were answered. The patient agreed with the plan and demonstrated an understanding of the instructions.   The patient was advised to call back or seek an in-person evaluation if the symptoms worsen or if the condition fails to improve as anticipated.  I provided 25 minutes of non-face-to-face time during this encounter.   [name], MD

## 2019-04-04 ENCOUNTER — Encounter: Payer: Self-pay | Admitting: Family Medicine

## 2019-04-04 ENCOUNTER — Telehealth: Payer: Self-pay | Admitting: Family Medicine

## 2019-04-04 DIAGNOSIS — E785 Hyperlipidemia, unspecified: Secondary | ICD-10-CM

## 2019-04-04 DIAGNOSIS — R9402 Abnormal brain scan: Secondary | ICD-10-CM | POA: Insufficient documentation

## 2019-04-04 DIAGNOSIS — E0789 Other specified disorders of thyroid: Secondary | ICD-10-CM | POA: Insufficient documentation

## 2019-04-04 DIAGNOSIS — I10 Essential (primary) hypertension: Secondary | ICD-10-CM

## 2019-04-04 DIAGNOSIS — R252 Cramp and spasm: Secondary | ICD-10-CM

## 2019-04-04 DIAGNOSIS — E041 Nontoxic single thyroid nodule: Secondary | ICD-10-CM | POA: Insufficient documentation

## 2019-04-04 NOTE — Assessment & Plan Note (Signed)
Repeat imaging indicated, will need to contact patient about this

## 2019-04-04 NOTE — Assessment & Plan Note (Signed)
Reports new tingling in thighs and RUE symptoms with abnormal brain scan , needs Neurology evaluation , this is discussed with Ms Placzek and she is in agreement

## 2019-04-04 NOTE — Telephone Encounter (Signed)
Please see patient's discharge instruction sheet from recent visit. I attempted to call  To speak with her but had to leave her a message giving her the option to page me or to call the office tomorrow. We had discussed her need for Neurology and she agreed. In going through her record, I also note that she has a thyroid mass last imaged by ENT in 2011 , and no record since. I have requested an [place] of thyroid gland, please explain this to her  Also she does need fasting labs asap, these will need to be ordered  Questions, please ask me!  Thanks

## 2019-04-04 NOTE — Patient Instructions (Addendum)
F/u end September, please call if you need me before  Please get  Fasting labs asap, CBC, cmp and eGFr, tSH, magnesium  As we discussed, you are being referred to Neurology in [place], for evaluation of your symptoms of recent onset of tingling in thighs and right upper extremity. I do recommend you follow up especially in light  Of prior abnormal scan report, as we discussed.  You are referred for a f/u of your thyroid gland  Congratulations on successful weight loss reported , through change in diet, please keep it up! Please commit to at least 30 minutes of daily exercise as we discussed  Moving around every 1.5 to 2 hrs while working from home , at a desk, will likley help with your new symptoms as both may be due to overuse of hands on the computer and prolonged sitting  It is important that you exercise regularly at least 30 minutes 5 times a week. If you develop chest pain, have severe difficulty breathing, or feel very tired, stop exercising immediately and seek medical attention    Think about what you will eat, plan ahead. Choose " clean, green, fresh or frozen" over canned, processed or packaged foods which are more sugary, salty and fatty. 70 to 75% of food eaten should be vegetables and fruit. Three meals at set times with snacks allowed between meals, but they must be fruit or vegetables. Aim to eat over a 12 hour period , example 7 am to 7 pm, and STOP after  your last meal of the day. Drink water,generally about 64 ounces per day, no other drink is as healthy. Fruit juice is best enjoyed in a healthy way, by EATING the fruit.  Social distancing. Frequent hand washing with soap and water Keeping your hands off of your face. These 3 practices will help to keep both you and your community healthy during this time. Please practice them faithfully!

## 2019-04-04 NOTE — Assessment & Plan Note (Signed)
Controlled, no change in medication DASH diet and commitment to daily physical activity for a minimum of 30 minutes discussed and encouraged, as a part of hypertension management. The importance of attaining a healthy weight is also discussed.  BP/Weight 04/01/2019 10/13/2018 06/22/2018 12/23/2017 11/13/2017 11/11/2017 5/50/0164  Systolic BP 290 379 558 316 742 552 589  Diastolic BP 82 82 80 88 80 81 76  Wt. (Lbs) 265 290 298.04 286.75 292 290 304.25  BMI 42.77 46.81 48.1 46.28 47.13 46.81 49.11

## 2019-04-04 NOTE — Assessment & Plan Note (Signed)
Improved with dietary change, she is applauded on this, and encouraged to continue same Needs updated labs which are overdiue  Patient re-educated about  the importance of commitment to a  minimum of 150 minutes of exercise per week as able.  The importance of healthy food choices with portion control discussed, as well as eating regularly and within a 12 hour window most days. The need to choose "clean , green" food 50 to 75% of the time is discussed, as well as to make water the primary drink and set a goal of 64 ounces water daily.  Encouraged to start a food diary,  and to consider  joining a support group. Sample diet sheets offered. Goals set by the patient for the next several months.   Weight /BMI 04/01/2019 10/13/2018 06/22/2018  WEIGHT 265 lb 290 lb 298 lb 0.6 oz  HEIGHT 5\' 6"  5\' 6"  5\' 6"   BMI 42.77 kg/m2 46.81 kg/m2 48.1 kg/m2

## 2019-04-05 NOTE — Addendum Note (Signed)
Addended by: Eual Fines on: 04/05/2019 08:57 AM   Modules accepted: Orders

## 2019-05-12 ENCOUNTER — Telehealth: Payer: Self-pay | Admitting: Family Medicine

## 2019-05-12 NOTE — Telephone Encounter (Signed)
Please let her know no additional [place] of thyroid gland needed, based on test done in 10/2018, correction from what was on her d/c sheet

## 2019-05-12 NOTE — Telephone Encounter (Signed)
See previous message

## 2019-05-12 NOTE — Telephone Encounter (Signed)
Patient aware.

## 2019-05-18 ENCOUNTER — Ambulatory Visit (HOSPITAL_COMMUNITY): Admission: RE | Admit: 2019-05-18 | Payer: BC Managed Care – PPO | Source: Ambulatory Visit

## 2019-05-25 ENCOUNTER — Ambulatory Visit (HOSPITAL_COMMUNITY): Payer: BC Managed Care – PPO

## 2019-06-03 ENCOUNTER — Other Ambulatory Visit: Payer: Self-pay

## 2019-06-03 ENCOUNTER — Ambulatory Visit (HOSPITAL_COMMUNITY)
Admission: RE | Admit: 2019-06-03 | Discharge: 2019-06-03 | Disposition: A | Discharge status: Home / Self Care | Payer: BC Managed Care – PPO | Source: Ambulatory Visit | Attending: Family Medicine | Admitting: Family Medicine

## 2019-06-03 DIAGNOSIS — E0789 Other specified disorders of thyroid: Secondary | ICD-10-CM | POA: Diagnosis present

## 2019-06-04 ENCOUNTER — Telehealth: Payer: Self-pay

## 2019-06-04 NOTE — Telephone Encounter (Signed)
Was referred to [facility] in April but was unable to go at that time. Asked if we would please schedule her an appointment. She Prefers tues, thurs or fri- anytime is fine

## 2019-06-04 NOTE — Telephone Encounter (Signed)
I called emily with Dr Merlene Laughter and lvm with Raquel Sarna to call and schedule appt

## 2019-07-16 ENCOUNTER — Other Ambulatory Visit: Payer: Self-pay | Admitting: Family Medicine

## 2019-07-16 DIAGNOSIS — I10 Essential (primary) hypertension: Secondary | ICD-10-CM

## 2019-07-17 ENCOUNTER — Other Ambulatory Visit: Payer: Self-pay | Admitting: Family Medicine

## 2019-07-17 DIAGNOSIS — I10 Essential (primary) hypertension: Secondary | ICD-10-CM

## 2019-08-23 ENCOUNTER — Ambulatory Visit: Payer: BC Managed Care – PPO | Admitting: Family Medicine

## 2019-10-08 ENCOUNTER — Other Ambulatory Visit: Payer: Self-pay

## 2019-10-08 DIAGNOSIS — Z20822 Contact with and (suspected) exposure to covid-19: Secondary | ICD-10-CM

## 2019-10-10 ENCOUNTER — Encounter: Payer: Self-pay | Admitting: Family Medicine

## 2019-10-10 LAB — NOVEL CORONAVIRUS, NAA: SARS-CoV-2, NAA: DETECTED — AB

## 2019-10-11 ENCOUNTER — Telehealth: Payer: Self-pay

## 2019-10-11 NOTE — Telephone Encounter (Signed)
Calling to let us know that the patient is positive, per the chart, Abby has been in contact with the pt

## 2019-10-11 NOTE — Telephone Encounter (Signed)
Office notified of positive COVID-19 test result. Front desk Therisa Doyne will send message to Dr Moshe Cipro.

## 2019-10-12 ENCOUNTER — Telehealth: Payer: Self-pay

## 2019-10-12 ENCOUNTER — Other Ambulatory Visit: Payer: Self-pay

## 2019-10-12 DIAGNOSIS — J3089 Other allergic rhinitis: Secondary | ICD-10-CM

## 2019-10-12 DIAGNOSIS — I10 Essential (primary) hypertension: Secondary | ICD-10-CM

## 2019-10-12 MED ORDER — FLUTICASONE PROPIONATE 50 MCG/ACT NA SUSP: 2.0000 | Freq: Every day | NASAL | 3 refills | Status: DC

## 2019-10-12 MED ORDER — AMLODIPINE BESYLATE 10 MG PO TABS: 10.0000 mg | ORAL_TABLET | Freq: Every day | ORAL | 0 refills | Status: DC

## 2019-10-12 NOTE — Telephone Encounter (Signed)
Agree with ypour recommendations, she needs to make and keep an appointment none on schedule. Betsy , pls sched in office appt, thanks

## 2019-10-12 NOTE — Telephone Encounter (Signed)
Please call the patient to discuss symptoms and being Covid +

## 2019-10-12 NOTE — Telephone Encounter (Signed)
Patient called and stated that she has a post nasal drip. I refilled her Flonase and encouraged her to use it and advised her it may take a day or so for the Flonase to start drying everything up. I advised her I would send the provider a message to see if there was anything else she wanted to be done. Had a rapid covid test performed at an urgent care and was given an antibiotic there and is taking it. Also refilled patients BP med for her.

## 2019-10-13 NOTE — Telephone Encounter (Signed)
This pt is Covid Positive, I will reach out for a Tele health Visit.

## 2019-10-13 NOTE — Telephone Encounter (Signed)
Reached out to the pt, does not need anything at this time. She is good, didn't want to make an appointment at this time.

## 2019-10-14 NOTE — Telephone Encounter (Signed)
Patient has to have an appointment, can be telephone appt, or there will be no medication refills going forward. Please explain this to the patient.Thank you

## 2019-10-20 ENCOUNTER — Encounter: Payer: Self-pay | Admitting: Family Medicine

## 2019-10-28 ENCOUNTER — Ambulatory Visit (INDEPENDENT_AMBULATORY_CARE_PROVIDER_SITE_OTHER): Payer: BC Managed Care – PPO | Admitting: Family Medicine

## 2019-11-11 ENCOUNTER — Ambulatory Visit (INDEPENDENT_AMBULATORY_CARE_PROVIDER_SITE_OTHER): Payer: BC Managed Care – PPO | Admitting: Family Medicine

## 2019-12-29 ENCOUNTER — Ambulatory Visit: Payer: BC Managed Care – PPO | Admitting: Family Medicine

## 2020-01-05 ENCOUNTER — Ambulatory Visit (HOSPITAL_COMMUNITY)
Admission: RE | Admit: 2020-01-05 | Discharge: 2020-01-05 | Disposition: A | Discharge status: Home / Self Care | Payer: BC Managed Care – PPO | Source: Ambulatory Visit | Attending: Family Medicine | Admitting: Family Medicine

## 2020-01-05 ENCOUNTER — Other Ambulatory Visit: Payer: Self-pay

## 2020-01-05 ENCOUNTER — Encounter: Payer: Self-pay | Admitting: Family Medicine

## 2020-01-05 ENCOUNTER — Ambulatory Visit: Payer: BC Managed Care – PPO | Admitting: Family Medicine

## 2020-01-05 VITALS — BP 118/78 | HR 95 | Temp 98.3°F | Resp 16 | Ht 66.0 in | Wt 285.1 lb

## 2020-01-05 DIAGNOSIS — D126 Benign neoplasm of colon, unspecified: Secondary | ICD-10-CM

## 2020-01-05 DIAGNOSIS — E785 Hyperlipidemia, unspecified: Secondary | ICD-10-CM

## 2020-01-05 DIAGNOSIS — M79661 Pain in right lower leg: Secondary | ICD-10-CM

## 2020-01-05 DIAGNOSIS — I10 Essential (primary) hypertension: Secondary | ICD-10-CM | POA: Diagnosis not present

## 2020-01-05 DIAGNOSIS — E041 Nontoxic single thyroid nodule: Secondary | ICD-10-CM

## 2020-01-05 DIAGNOSIS — E559 Vitamin D deficiency, unspecified: Secondary | ICD-10-CM

## 2020-01-05 DIAGNOSIS — E049 Nontoxic goiter, unspecified: Secondary | ICD-10-CM

## 2020-01-05 MED ORDER — AMLODIPINE BESYLATE 10 MG PO TABS: 10.0000 mg | ORAL_TABLET | Freq: Every day | ORAL | 3 refills | Status: DC

## 2020-01-05 NOTE — Patient Instructions (Signed)
F/U in mid July, call if you need me sooner  [place] of right calf today to evaluaet pain and rule out clot today  Labs today, cBC, lipid, cmp and EGFr, hBA1C , TSH and vit D  ( [facility]) It is important that you exercise regularly at least 30 minutes 5 times a week. If you develop chest pain, have severe difficulty breathing, or feel very tired, stop exercising immediately and seek medical attention    Think about what you will eat, plan ahead. Choose " clean, green, fresh or frozen" over canned, processed or packaged foods which are more sugary, salty and fatty. 70 to 75% of food eaten should be vegetables and fruit. Three meals at set times with snacks allowed between meals, but they must be fruit or vegetables. Aim to eat over a 12 hour period , example 7 am to 7 pm, and STOP after  your last meal of the day. Drink water,generally about 64 ounces per day, no other drink is as healthy. Fruit juice is best enjoyed in a healthy way, by EATING the fruit.   It is important that you exercise regularly at least 30 minutes 5 times a week. If you develop chest pain, have severe difficulty breathing, or feel very tired, stop exercising immediately and seek medical attention

## 2020-01-05 NOTE — Progress Notes (Signed)
   [name]     MRN: [id number]      DOB: 03-04-1961   HPI Ms. [name] is here for follow up and re-evaluation of chronic medical conditions, medication management and review of any available recent lab and radiology data.  Preventive health is updated, specifically  Cancer screening and Immunization.   Questions or concerns regarding consultations or procedures which the PT has had in the interim are  addressed. The PT denies any adverse reactions to current medications since the last visit.  1 week h/o intermittent right calf swelling, no redness , mild discomfort, denies hemoptysis, cough or dyspnea Has upcoming appt with weight and wellness re weight loss, has gained     ROS Denies recent fever or chills. Denies sinus pressure, nasal congestion, ear pain or sore throat. Denies chest congestion, productive cough or wheezing. Denies chest pains, palpitations and leg swelling Denies abdominal pain, nausea, vomiting,diarrhea or constipation.   Denies dysuria, frequency, hesitancy or incontinence. . Denies headaches, seizures, numbness, or tingling. Denies depression, anxiety or insomnia. Denies skin break down or rash.   PE  BP 118/78   Pulse 95   Temp 98.3 F (36.8 C) (Temporal)   Resp 16   Ht 5\' 6"  (1.676 m)   Wt 285 lb 1.9 oz (129.3 kg)   LMP 10/19/2011 Comment: pt signed preg test waiver 06/23/17  SpO2 97%   BMI 46.02 kg/m   Patient alert and oriented and in no cardiopulmonary distress.  HEENT: No facial asymmetry, EOMI,     Neck supple .  Chest: Clear to auscultation bilaterally.  CVS: S1, S2 no murmurs, no S3.Regular rate.  ABD: Soft non tender.   Ext: No edema  MS: Adequate ROM spine, shoulders, hips and knees.  Skin: Intact, no ulcerations or rash noted.  Psych: Good eye contact, normal affect. Memory intact not anxious or depressed appearing.  CNS: CN 2-12 intact, power,  normal throughout.no focal deficits noted.   Assessment & Plan  HTN  (hypertension) Controlled, no change in medication DASH diet and commitment to daily physical activity for a minimum of 30 minutes discussed and encouraged, as a part of hypertension management. The importance of attaining a healthy weight is also discussed.  BP/Weight 01/05/2020 04/01/2019 10/13/2018 06/22/2018 12/23/2017 11/13/2017 0000000  Systolic BP 123456 [id number] Q000111Q 0000000 Q000111Q  Diastolic BP 78 82 82 80 88 80 81  Wt. (Lbs) 285.12 265 290 298.04 286.75 292 290  BMI 46.02 42.77 46.81 48.1 46.28 47.13 46.81       Morbid obesity  Patient re-educated about  the importance of commitment to a  minimum of 150 minutes of exercise per week as able.  The importance of healthy food choices with portion control discussed, as well as eating regularly and within a 12 hour window most days. The need to choose "clean , green" food 50 to 75% of the time is discussed, as well as to make water the primary drink and set a goal of 64 ounces water daily.    Weight /BMI 01/05/2020 04/01/2019 10/13/2018  WEIGHT 285 lb 1.9 oz 265 lb 290 lb  HEIGHT 5\' 6"  5\' 6"  5\' 6"   BMI 46.02 kg/m2 42.77 kg/m2 46.81 kg/m2      Tubular adenoma of colon Denies abdominal pain or change in BM  Right calf pain 1 week history with increased risk of dVT, stat doppler

## 2020-01-06 ENCOUNTER — Encounter: Payer: Self-pay | Admitting: Family Medicine

## 2020-01-06 LAB — COMPLETE METABOLIC PANEL WITH GFR
AG Ratio: 1.7 (calc) (ref 1.0–2.5)
ALT: 12 U/L (ref 6–29)
AST: 15 U/L (ref 10–35)
Albumin: 4.3 g/dL (ref 3.6–5.1)
Alkaline phosphatase (APISO): 79 U/L (ref 37–153)
BUN: 16 mg/dL (ref 7–25)
CO2: 29 mmol/L (ref 20–32)
Calcium: 9.9 mg/dL (ref 8.6–10.4)
Chloride: 104 mmol/L (ref 98–110)
Creat: 0.73 mg/dL (ref 0.50–1.05)
GFR, Est African American: 107 mL/min/{1.73_m2} (ref >=60)
GFR, Est Non African American: 92 mL/min/{1.73_m2} (ref >=60)
Globulin: 2.5 g/dL (calc) (ref 1.9–3.7)
Glucose, Bld: 99 mg/dL (ref 65–99)
Potassium: 4.3 mmol/L (ref 3.5–5.3)
Sodium: 141 mmol/L (ref 135–146)
Total Bilirubin: 0.4 mg/dL (ref 0.2–1.2)
Total Protein: 6.8 g/dL (ref 6.1–8.1)

## 2020-01-06 LAB — CBC
HCT: 38.4 % (ref 35.0–45.0)
Hemoglobin: 12.5 g/dL (ref 11.7–15.5)
MCH: 29.1 pg (ref 27.0–33.0)
MCHC: 32.6 g/dL (ref 32.0–36.0)
MCV: 89.3 fL (ref 80.0–100.0)
MPV: 11.8 fL (ref 7.5–12.5)
Platelets: 309 10*3/uL (ref 140–400)
RBC: 4.3 10*6/uL (ref 3.80–5.10)
RDW: 11.7 % (ref 11.0–15.0)
WBC: 4.6 10*3/uL (ref 3.8–10.8)

## 2020-01-06 LAB — LIPID PANEL
Cholesterol: 207 mg/dL — ABNORMAL HIGH (ref <200)
HDL: 65 mg/dL (ref >=50)
LDL Cholesterol (Calc): 128 mg/dL (calc) — ABNORMAL HIGH
Non-HDL Cholesterol (Calc): 142 mg/dL (calc) — ABNORMAL HIGH (ref <130)
Total CHOL/HDL Ratio: 3.2 (calc) (ref <5.0)
Triglycerides: 53 mg/dL (ref <150)

## 2020-01-06 LAB — HEMOGLOBIN A1C
Hgb A1c MFr Bld: 5.1 % of total Hgb (ref <5.7)
Mean Plasma Glucose: 100 (calc)
eAG (mmol/L): 5.5 (calc)

## 2020-01-06 LAB — TSH: TSH: 1.46 mIU/L (ref 0.40–4.50)

## 2020-01-06 LAB — VITAMIN D 25 HYDROXY (VIT D DEFICIENCY, FRACTURES): Vit D, 25-Hydroxy: 29 ng/mL — ABNORMAL LOW (ref 30–100)

## 2020-01-08 ENCOUNTER — Encounter: Payer: Self-pay | Admitting: Family Medicine

## 2020-01-08 DIAGNOSIS — M79661 Pain in right lower leg: Secondary | ICD-10-CM | POA: Insufficient documentation

## 2020-01-08 NOTE — Assessment & Plan Note (Signed)
Controlled, no change in medication DASH diet and commitment to daily physical activity for a minimum of 30 minutes discussed and encouraged, as a part of hypertension management. The importance of attaining a healthy weight is also discussed.  BP/Weight 01/05/2020 04/01/2019 10/13/2018 06/22/2018 12/23/2017 11/13/2017 0000000  Systolic BP 123456 [id number] Q000111Q 0000000 Q000111Q  Diastolic BP 78 82 82 80 88 80 81  Wt. (Lbs) 285.12 265 290 298.04 286.75 292 290  BMI 46.02 42.77 46.81 48.1 46.28 47.13 46.81

## 2020-01-08 NOTE — Assessment & Plan Note (Signed)
1 week history with increased risk of dVT, stat doppler

## 2020-01-08 NOTE — Assessment & Plan Note (Signed)
  Patient re-educated about  the importance of commitment to a  minimum of 150 minutes of exercise per week as able.  The importance of healthy food choices with portion control discussed, as well as eating regularly and within a 12 hour window most days. The need to choose "clean , green" food 50 to 75% of the time is discussed, as well as to make water the primary drink and set a goal of 64 ounces water daily.    Weight /BMI 01/05/2020 04/01/2019 10/13/2018  WEIGHT 285 lb 1.9 oz 265 lb 290 lb  HEIGHT 5\' 6"  5\' 6"  5\' 6"   BMI 46.02 kg/m2 42.77 kg/m2 46.81 kg/m2

## 2020-01-08 NOTE — Assessment & Plan Note (Signed)
Denies abdominal pain or change in BM

## 2020-01-12 ENCOUNTER — Encounter (INDEPENDENT_AMBULATORY_CARE_PROVIDER_SITE_OTHER): Payer: Self-pay | Admitting: Bariatrics

## 2020-01-12 ENCOUNTER — Ambulatory Visit (INDEPENDENT_AMBULATORY_CARE_PROVIDER_SITE_OTHER): Payer: BC Managed Care – PPO | Admitting: Bariatrics

## 2020-01-12 ENCOUNTER — Other Ambulatory Visit: Payer: Self-pay

## 2020-01-12 VITALS — BP 123/73 | HR 84 | Temp 98.5°F | Ht 66.0 in | Wt 283.0 lb

## 2020-01-12 DIAGNOSIS — R0602 Shortness of breath: Secondary | ICD-10-CM | POA: Diagnosis not present

## 2020-01-12 DIAGNOSIS — E559 Vitamin D deficiency, unspecified: Secondary | ICD-10-CM

## 2020-01-12 DIAGNOSIS — R5383 Other fatigue: Secondary | ICD-10-CM | POA: Diagnosis not present

## 2020-01-12 DIAGNOSIS — Z1331 Encounter for screening for depression: Secondary | ICD-10-CM | POA: Diagnosis not present

## 2020-01-12 DIAGNOSIS — Z6841 Body Mass Index (BMI) 40.0 and over, adult: Secondary | ICD-10-CM

## 2020-01-12 DIAGNOSIS — E785 Hyperlipidemia, unspecified: Secondary | ICD-10-CM

## 2020-01-12 DIAGNOSIS — Z9189 Other specified personal risk factors, not elsewhere classified: Secondary | ICD-10-CM | POA: Diagnosis not present

## 2020-01-12 DIAGNOSIS — I10 Essential (primary) hypertension: Secondary | ICD-10-CM | POA: Diagnosis not present

## 2020-01-12 DIAGNOSIS — E049 Nontoxic goiter, unspecified: Secondary | ICD-10-CM

## 2020-01-12 DIAGNOSIS — M25562 Pain in left knee: Secondary | ICD-10-CM

## 2020-01-12 DIAGNOSIS — G4730 Sleep apnea, unspecified: Secondary | ICD-10-CM

## 2020-01-12 DIAGNOSIS — Z0289 Encounter for other administrative examinations: Secondary | ICD-10-CM

## 2020-01-12 DIAGNOSIS — M2550 Pain in unspecified joint: Secondary | ICD-10-CM | POA: Insufficient documentation

## 2020-01-12 MED ORDER — VITAMIN D (ERGOCALCIFEROL) 1.25 MG (50000 UNIT) PO CAPS: 50000.0000 [IU] | ORAL_CAPSULE | ORAL | 0 refills | Status: DC

## 2020-01-12 NOTE — Progress Notes (Signed)
Dear Dr. [name],   Thank you for referring [name] to our clinic. The following note includes my evaluation and treatment recommendations.  Chief Complaint:   OBESITY [name] (MR# [id number]) is a 59 y.o. female who presents for evaluation and treatment of obesity and related comorbidities. Current BMI is Body mass index is 45.68 kg/m.Marland Kitchen [name] has been struggling with her weight for many years and has been unsuccessful in either losing weight, maintaining weight loss, or reaching her healthy weight goal.  [name] is currently in the action stage of change and ready to dedicate time achieving and maintaining a healthier weight. [name] is interested in becoming our patient and working on intensive lifestyle modifications including (but not limited to) diet and exercise for weight loss.  [name] likes to cook sometimes. She craves sweets, but dislikes liver. She has seen the orthopedist and her left knee needs replacement; her BMI needs to be 40 or lower to have this procedure.  [name]'s habits were reviewed today and are as follows: Her family eats meals together, she thinks her family will eat healthier with her, her desired weight loss is 113-133 lbs, she has been heavy most of her life, she started gaining weight after college, her heaviest weight ever was 300 pounds, she craves sweets, she snacks frequently in the evenings, she has problems with excessive hunger at times, she sometimes eats larger portions than normal, she has binge eating behaviors and she struggles with emotional eating.  Depression Screen [name]'s Food and Mood (modified PHQ-9) score was 7.  Depression screen PHQ 2/9 01/12/2020  Decreased Interest 1  Down, Depressed, Hopeless 1  PHQ - 2 Score 2  Altered sleeping 0  Tired, decreased energy 1  Change in appetite 1  Feeling bad or failure about yourself  1  Trouble concentrating 1  Moving slowly or fidgety/restless 1  Suicidal thoughts 0    PHQ-9 Score 7  Difficult doing work/chores Not difficult at all   Subjective:   Other fatigue. [name] denies daytime somnolence and denies waking up still tired. [name] generally gets 6-7 hours of sleep per night, and states that she has generally restful sleep. Snoring is present. Apneic episodes are not present. Epworth Sleepiness Score is 8.  Shortness of breath on exertion. [name] notes increasing shortness of breath with certain activities and seems to be worsening over time with weight gain. She notes getting out of breath sooner with activity than she used to. This has gotten worse recently. Jahniya denies shortness of breath at rest or orthopnea.  Dyslipidemia. [name] is on no medications.  Essential hypertension. [name] is taking Norvasc. Hypertension is well controlled with a blood pressure today of 123/73.   BP Readings from Last 3 Encounters:  01/12/20 123/73  01/05/20 118/78  04/01/19 130/82   Lab Results  Component Value Date   CREATININE 0.73 01/05/2020   CREATININE 0.71 06/22/2018   CREATININE 0.70 11/11/2017   Goiter. [name] has a nodule of the right lobe of the thyroid. Thyroid test normal.  Vitamin D deficiency. [name] is taking OTC Vitamin D. Last Vitamin D level 29 on 01/05/2020.  Sleep disorder breathing. [name] is not on CPAP.  Left knee pain, unspecified chronicity. [name] has no significant pain with walking; has more pain with sitting.  Depression screening. [name] had a mildly positive depression screening with a PHQ-9 score of 7.  At risk for osteoporosis. Rotasha is at higher risk of osteopenia and osteoporosis due to Vitamin D  deficiency.   Assessment/Plan:   Other fatigue. Bonetta does feel that her weight is causing her energy to be lower than it should be. Fatigue may be related to obesity, depression or many other causes. Labs will be ordered, and in the meanwhile, [name] will focus on self care including making healthy food  choices, increasing physical activity and focusing on stress reduction. EKG 12-Lead, Insulin, random, Vitamin B12 ordered.  Shortness of breath on exertion. Abigaille does feel that she gets out of breath more easily that she used to when she exercises. Anaeli's shortness of breath appears to be obesity related and exercise induced. She has agreed to work on weight loss and gradually increase exercise to treat her exercise induced shortness of breath. Will continue to monitor closely.  Dyslipidemia. Gereldine will decrease trans and saturated fats.  Essential hypertension. Brenee is working on healthy weight loss and exercise to improve blood pressure control. We will watch for signs of hypotension as she continues her lifestyle modifications. She will continue her medications as prescribed.  Goiter. Jessenia will follow-up with her PCP and endocrinologist if needed.  Vitamin D deficiency. Low Vitamin D level contributes to fatigue and are associated with obesity, breast, and colon cancer. She was given a prescription for Vitamin D, Ergocalciferol, (DRISDOL) 1.25 MG (50000 UNIT) CAPS capsule every week #4 with 0 refills and will follow-up for routine testing of Vitamin D, at least 2-3 times per year to avoid over-replacement. Vitamin B12 level ordered.  Sleep disorder breathing. Tena will follow-up with her PCP as directed.  Left knee pain, unspecified chronicity. [name] will follow-up with the orthopedist. She will use the standing desk.  Depression screening. [name] had a mildly positive depression screening. Depression is commonly associated with obesity and often results in emotional eating behaviors. We will monitor this closely and work on CBT to help improve the non-hunger eating patterns. Referral to Psychology may be required if no improvement is seen as she continues in our clinic.  At risk for osteoporosis. [name] was given approximately 15 minutes of osteoporosis prevention  counseling today. [name] is at risk for osteopenia and osteoporosis due to her Vitamin D deficiency. She was encouraged to take her Vitamin D and follow her higher calcium diet and increase strengthening exercise to help strengthen her bones and decrease her risk of osteopenia and osteoporosis.  Repetitive spaced learning was employed today to elicit superior memory formation and behavioral change.  Class 3 severe obesity with serious comorbidity and body mass index (BMI) of 45.0 to 49.9 in adult, unspecified obesity type ([name]).  [name] is currently in the action stage of change and her goal is to continue with weight loss efforts. I recommend [name] begin the structured treatment plan as follows:  She has agreed to the Category 3 Plan with no bread, limited carbs, and increased healthy fats.  We independently reviewed with the patient her labs from 01/27/021 including CMP, lipids, Vitamin D, CBC, A1c, and TSH.  She will work on meal planning.  Exercise goals: All adults should avoid inactivity. Some physical activity is better than none, and adults who participate in any amount of physical activity gain some health benefits.   Behavioral modification strategies: increasing lean protein intake, decreasing simple carbohydrates, increasing vegetables, increasing water intake, decreasing eating out, no skipping meals, meal planning and cooking strategies, keeping healthy foods in the home and planning for success.  She was informed of the importance of frequent follow-up visits to maximize her success with intensive lifestyle  modifications for her multiple health conditions. She was informed we would discuss her lab results at her next visit unless there is a critical issue that needs to be addressed sooner. Tanija agreed to keep her next visit at the agreed upon time to discuss these results.  Objective:   Blood pressure 123/73, pulse 84, temperature 98.5 F (36.9 C), height 5\' 6"  (1.676 m),  weight 283 lb (128.4 kg), last menstrual period 10/19/2011, SpO2 99 %. Body mass index is 45.68 kg/m.  EKG: Sinus  Rhythm with a rate of 73 BPM - consider old anterior infarct. Abnormal.  Indirect Calorimeter completed today shows a VO2 of 267 and a REE of 1860.  Her calculated basal metabolic rate is Q000111Q thus her basal metabolic rate is better than expected.  General: Cooperative, alert, well developed, in no acute distress. HEENT: Conjunctivae and lids unremarkable. Cardiovascular: Regular rhythm.  Lungs: Normal work of breathing. Neurologic: No focal deficits.   Lab Results  Component Value Date   CREATININE 0.73 01/05/2020   BUN 16 01/05/2020   NA 141 01/05/2020   K 4.3 01/05/2020   CL 104 01/05/2020   CO2 29 01/05/2020   Lab Results  Component Value Date   ALT 12 01/05/2020   AST 15 01/05/2020   ALKPHOS 76 12/07/2015   BILITOT 0.4 01/05/2020   Lab Results  Component Value Date   HGBA1C 5.1 01/05/2020   HGBA1C 5.1 06/22/2018   HGBA1C 5.4 12/07/2015   HGBA1C 5.0 11/12/2014   HGBA1C 5.4 02/04/2013   No results found for: INSULIN Lab Results  Component Value Date   TSH 1.46 01/05/2020   Lab Results  Component Value Date   CHOL 207 (H) 01/05/2020   HDL 65 01/05/2020   LDLCALC 128 (H) 01/05/2020   TRIG 53 01/05/2020   CHOLHDL 3.2 01/05/2020   Lab Results  Component Value Date   WBC 4.6 01/05/2020   HGB 12.5 01/05/2020   HCT 38.4 01/05/2020   MCV 89.3 01/05/2020   PLT 309 01/05/2020   Lab Results  Component Value Date   IRON 68 12/07/2015   FERRITIN 145 12/07/2015   Attestation Statements:   Reviewed by clinician on day of visit: allergies, medications, problem list, medical history, surgical history, family history, social history, and previous encounter notes.  Migdalia Dk, am acting as Location manager for [employer], DO   I have reviewed the above documentation for accuracy and completeness, and I agree with the above. Jearld Lesch, DO

## 2020-01-13 LAB — VITAMIN B12: Vitamin B-12: 1013 pg/mL (ref 232–1245)

## 2020-01-13 LAB — INSULIN, RANDOM: INSULIN: 7.4 u[IU]/mL (ref 2.6–24.9)

## 2020-01-26 ENCOUNTER — Ambulatory Visit (INDEPENDENT_AMBULATORY_CARE_PROVIDER_SITE_OTHER): Payer: BC Managed Care – PPO | Admitting: Family Medicine

## 2020-01-26 ENCOUNTER — Other Ambulatory Visit: Payer: Self-pay

## 2020-01-26 ENCOUNTER — Ambulatory Visit (INDEPENDENT_AMBULATORY_CARE_PROVIDER_SITE_OTHER): Payer: BC Managed Care – PPO | Admitting: Bariatrics

## 2020-01-26 ENCOUNTER — Encounter (INDEPENDENT_AMBULATORY_CARE_PROVIDER_SITE_OTHER): Payer: Self-pay | Admitting: Family Medicine

## 2020-01-26 VITALS — BP 119/76 | HR 83 | Temp 98.1°F | Ht 66.0 in | Wt 287.0 lb

## 2020-01-26 DIAGNOSIS — E559 Vitamin D deficiency, unspecified: Secondary | ICD-10-CM

## 2020-01-26 DIAGNOSIS — Z6841 Body Mass Index (BMI) 40.0 and over, adult: Secondary | ICD-10-CM

## 2020-01-26 MED ORDER — VITAMIN D (ERGOCALCIFEROL) 1.25 MG (50000 UNIT) PO CAPS: 50000.0000 [IU] | ORAL_CAPSULE | ORAL | 0 refills | Status: DC

## 2020-01-26 NOTE — Progress Notes (Signed)
Chief Complaint:   [place] is here to discuss her progress with her obesity treatment plan along with follow-up of her obesity related diagnoses. Terriona is on the Category 3 Plan and states she is following her eating plan approximately 60% of the time. Allyssia states she is doing 0 minutes 0 times per week.  Today's visit was #: 2 Starting weight: 283 lbs Starting date: 01/12/2020 Today's weight: 287 lbs Today's date: 01/26/2020 Total lbs lost to date: 0 Total lbs lost since last in-office visit: 0  Interim History: Panhia struggled to follow her plan. She had increased stress and didn't do well with much of her food options. She did Keto in the past, and would like to go back to a version of this.  Subjective:   1. Vitamin D deficiency Imalay is on Vit D prescription now, and her level is not yet at goal.  Assessment/Plan:   1. Vitamin D deficiency Low Vitamin D level contributes to fatigue and are associated with obesity, breast, and colon cancer. We will refill prescription Vitamin D for 1 month. Othello will follow-up for routine testing of Vitamin D, at least 2-3 times per year to avoid over-replacement. We will recheck labs in 2 months.  - Vitamin D, Ergocalciferol, (DRISDOL) 1.25 MG (50000 UNIT) CAPS capsule; Take 1 capsule (50,000 Units total) by mouth every 7 (seven) days.  Dispense: 4 capsule; Refill: 0  2. Class 3 severe obesity with serious comorbidity and body mass index (BMI) of 45.0 to 49.9 in adult, unspecified obesity type ([place]) Cera is currently in the action stage of change. As such, her goal is to continue with weight loss efforts. She has agreed to keeping a food journal and adhering to recommended goals of 1300-1500 calories and 85+ grams of protein daily.  Behavioral modification strategies: increasing lean protein intake and keeping a strict food journal.  Merritt has agreed to follow-up with our clinic in 2 weeks. She was informed of the  importance of frequent follow-up visits to maximize her success with intensive lifestyle modifications for her multiple health conditions.   Objective:   Blood pressure 119/76, pulse 83, temperature 98.1 F (36.7 C), temperature source Oral, height 5\' 6"  (1.676 m), weight 287 lb (130.2 kg), last menstrual period 10/19/2011, SpO2 99 %. Body mass index is 46.32 kg/m.  General: Cooperative, alert, well developed, in no acute distress. HEENT: Conjunctivae and lids unremarkable. Cardiovascular: Regular rhythm.  Lungs: Normal work of breathing. Neurologic: No focal deficits.   Lab Results  Component Value Date   CREATININE 0.73 01/05/2020   BUN 16 01/05/2020   NA 141 01/05/2020   K 4.3 01/05/2020   CL 104 01/05/2020   CO2 29 01/05/2020   Lab Results  Component Value Date   ALT 12 01/05/2020   AST 15 01/05/2020   ALKPHOS 76 12/07/2015   BILITOT 0.4 01/05/2020   Lab Results  Component Value Date   HGBA1C 5.1 01/05/2020   HGBA1C 5.1 06/22/2018   HGBA1C 5.4 12/07/2015   HGBA1C 5.0 11/12/2014   HGBA1C 5.4 02/04/2013   Lab Results  Component Value Date   INSULIN 7.4 01/12/2020   Lab Results  Component Value Date   TSH 1.46 01/05/2020   Lab Results  Component Value Date   CHOL 207 (H) 01/05/2020   HDL 65 01/05/2020   LDLCALC 128 (H) 01/05/2020   TRIG 53 01/05/2020   CHOLHDL 3.2 01/05/2020   Lab Results  Component Value Date   WBC  4.6 01/05/2020   HGB 12.5 01/05/2020   HCT 38.4 01/05/2020   MCV 89.3 01/05/2020   PLT 309 01/05/2020   Lab Results  Component Value Date   IRON 68 12/07/2015   FERRITIN 145 12/07/2015   Attestation Statements:   Reviewed by clinician on day of visit: allergies, medications, problem list, medical history, surgical history, family history, social history, and previous encounter notes.  Time spent on visit including pre-visit chart review and post-visit care was 41 minutes.    I, Trixie Dredge, am acting as transcriptionist for  Dennard Nip, MD.  I have reviewed the above documentation for accuracy and completeness, and I agree with the above. -  Dennard Nip, MD

## 2020-02-13 ENCOUNTER — Ambulatory Visit: Payer: Self-pay | Attending: Internal Medicine

## 2020-02-13 DIAGNOSIS — Z23 Encounter for immunization: Secondary | ICD-10-CM | POA: Insufficient documentation

## 2020-02-13 NOTE — Progress Notes (Signed)
   Covid-19 Vaccination Clinic  Name:  [name]    MRN: [id number] DOB: [birth year]  02/13/2020  [name] was observed post Covid-19 immunization for 15 minutes without incident. She was provided with Vaccine Information Sheet and instruction to access the V-Safe system.   [name] was instructed to call 911 with any severe reactions post vaccine: Marland Kitchen Difficulty breathing  . Swelling of face and throat  . A fast heartbeat  . A bad rash all over body  . Dizziness and weakness   Immunizations Administered    Name Date Dose VIS Date Route   Pfizer COVID-19 Vaccine 02/13/2020  9:30 AM 0.3 mL 11/19/2019 Intramuscular   Manufacturer: [place]   Lot: [id number]   [place]: [id number]

## 2020-02-16 ENCOUNTER — Ambulatory Visit (INDEPENDENT_AMBULATORY_CARE_PROVIDER_SITE_OTHER): Payer: BC Managed Care – PPO | Admitting: Family Medicine

## 2020-02-29 ENCOUNTER — Ambulatory Visit (INDEPENDENT_AMBULATORY_CARE_PROVIDER_SITE_OTHER): Payer: BC Managed Care – PPO | Admitting: Family Medicine

## 2020-03-05 ENCOUNTER — Ambulatory Visit: Payer: Self-pay | Attending: Internal Medicine

## 2020-03-05 DIAGNOSIS — Z23 Encounter for immunization: Secondary | ICD-10-CM

## 2020-03-05 NOTE — Progress Notes (Signed)
   Covid-19 Vaccination Clinic  Name:  [name]    MRN: [id number] DOB: Jun 24, 1961  03/05/2020  [name] was observed post Covid-19 immunization for 15 minutes without incident. She was provided with Vaccine Information Sheet and instruction to access the V-Safe system.   [name] was instructed to call 911 with any severe reactions post vaccine: Marland Kitchen Difficulty breathing  . Swelling of face and throat  . A fast heartbeat  . A bad rash all over body  . Dizziness and weakness   Immunizations Administered    Name Date Dose VIS Date Route   Pfizer COVID-19 Vaccine 03/05/2020  9:50 AM 0.3 mL 11/19/2019 Intramuscular   Manufacturer: [place]   Lot: [id number]   [place]: [id number]

## 2020-03-06 ENCOUNTER — Telehealth: Payer: Self-pay | Admitting: *Deleted

## 2020-03-06 NOTE — Telephone Encounter (Signed)
Pt needs a letter she is taking a state exam and needs the letter to state that she needs more time to take the exam due to medical purposes.

## 2020-03-08 NOTE — Telephone Encounter (Signed)
Letter ready to be collected

## 2020-05-09 ENCOUNTER — Other Ambulatory Visit: Payer: Self-pay

## 2020-05-09 ENCOUNTER — Encounter: Payer: Self-pay | Admitting: Family Medicine

## 2020-05-09 ENCOUNTER — Other Ambulatory Visit: Payer: Self-pay | Admitting: *Deleted

## 2020-05-09 ENCOUNTER — Ambulatory Visit (INDEPENDENT_AMBULATORY_CARE_PROVIDER_SITE_OTHER): Payer: BC Managed Care – PPO | Admitting: Family Medicine

## 2020-05-09 VITALS — BP 130/80 | HR 84 | Temp 98.9°F | Resp 15 | Ht 66.0 in | Wt 289.0 lb

## 2020-05-09 DIAGNOSIS — I10 Essential (primary) hypertension: Secondary | ICD-10-CM

## 2020-05-09 DIAGNOSIS — R1011 Right upper quadrant pain: Secondary | ICD-10-CM | POA: Diagnosis not present

## 2020-05-09 DIAGNOSIS — D126 Benign neoplasm of colon, unspecified: Secondary | ICD-10-CM

## 2020-05-09 DIAGNOSIS — E559 Vitamin D deficiency, unspecified: Secondary | ICD-10-CM

## 2020-05-09 DIAGNOSIS — E785 Hyperlipidemia, unspecified: Secondary | ICD-10-CM

## 2020-05-09 DIAGNOSIS — R140 Abdominal distension (gaseous): Secondary | ICD-10-CM | POA: Diagnosis not present

## 2020-05-09 DIAGNOSIS — B369 Superficial mycosis, unspecified: Secondary | ICD-10-CM

## 2020-05-09 MED ORDER — CLOTRIMAZOLE-BETAMETHASONE 1-0.05 % EX CREA: 1.0000 "application " | TOPICAL_CREAM | Freq: Two times a day (BID) | CUTANEOUS | 1 refills | Status: DC

## 2020-05-09 NOTE — Progress Notes (Signed)
[name]     MRN: [id number]      DOB: 02/24/1961   HPI [name] is here for follow up and re-evaluation of chronic medical conditions, medication management and review of any available recent lab and radiology data.  Preventive health is updated, specifically  Cancer screening and Immunization.   Questions or concerns regarding consultations or procedures which the PT has had in the interim are  Addressed.Is being followed by wellness center for weight loss with success The PT denies any adverse reactions to current medications since the last visit.  1 week h/o change in bowel movement  Generally goes every day or 2, however last week about 3 BM's one looked like little balls, had a 1 week h/o RUQ pain and bloating rated a 5 , physical activity did not aggravate pain and unsure if  food did  appeared to affect this  ROS Denies recent fever or chills. Denies sinus pressure, nasal congestion, ear pain or sore throat. Denies chest congestion, productive cough or wheezing. Denies chest pains, palpitations and leg swelling   Denies dysuria, frequency, hesitancy or incontinence. Denies joint pain, swelling and limitation in mobility. Denies headaches, seizures, numbness, or tingling. Denies depression, anxiety or insomnia. 1 week h/o itchy rash right buttock, comes and goes on an intermittent basis   PE  BP 130/80   Pulse 84   Temp 98.9 F (37.2 C) (Temporal)   Resp 15   Ht 5\' 6"  (1.676 m)   Wt 289 lb 0.6 oz (131.1 kg)   LMP 10/19/2011 Comment: pt signed preg test waiver 06/23/17  SpO2 96%   BMI 46.65 kg/m   Patient alert and oriented and in no cardiopulmonary distress.  HEENT: No facial asymmetry, EOMI,     Neck supple .  Chest: Clear to auscultation bilaterally.  CVS: S1, S2 no murmurs, no S3.Regular rate.  ABD: Soft mild rUQ tenderness.   Ext: No edema  MS: Adequate ROM spine, shoulders, hips and reduced in knees.  Skin: Intact, fungal rash on rigth buttock  approx 6 cm diameter  Psych: Good eye contact, normal affect. Memory intact not anxious or depressed appearing.  CNS: CN 2-12 intact, power,  normal throughout.no focal deficits noted.   Assessment & Plan  HTN (hypertension) Controlled, no change in medication DASH diet and commitment to daily physical activity for a minimum of 30 minutes discussed and encouraged, as a part of hypertension management. The importance of attaining a healthy weight is also discussed.  BP/Weight 05/09/2020 01/26/2020 01/12/2020 01/05/2020 04/01/2019 10/13/2018 123456  Systolic BP [id number] [id number] 123456 [id number]  Diastolic BP 80 76 73 78 82 82 80  Wt. (Lbs) 289.04 287 283 285.12 265 290 298.04  BMI 46.65 46.32 45.68 46.02 42.77 46.81 48.1       Morbid obesity  Patient re-educated about  the importance of commitment to a  minimum of 150 minutes of exercise per week as able.  The importance of healthy food choices with portion control discussed, as well as eating regularly and within a 12 hour window most days. The need to choose "clean , green" food 50 to 75% of the time is discussed, as well as to make water the primary drink and set a goal of 64 ounces water daily.    Weight /BMI 05/09/2020 01/26/2020 01/12/2020  WEIGHT 289 lb 0.6 oz 287 lb 283 lb  HEIGHT 5\' 6"  5\' 6"  5\' 6"   BMI 46.65 kg/m2 46.32 kg/m2 45.68 kg/m2  RUQ pain 1 week h/o RUQ pain and bloating, [place] to further eval   Tubular adenoma of colon Needs colonoscopy in 2022 per gI. Denies change in stool caliber  Or bowel habit  Dyslipidemia Hyperlipidemia:Low fat diet discussed and encouraged.   Lipid Panel  Lab Results  Component Value Date   CHOL 186 05/11/2020   HDL 57 05/11/2020   LDLCALC 116 (H) 05/11/2020   TRIG 51 05/11/2020   CHOLHDL 3.3 05/11/2020  needs to reduce fat in diet, improved     Dermatomycosis Topical med x 1 week , then as needed

## 2020-05-09 NOTE — Patient Instructions (Addendum)
F/u in 5 months, call if you need me before  You are referred for [place] of gall bladder we will call with appointment   New for skin rash , is clotrimazole/ betamethasone cream , use for 1 week , then as needed   Please get fasting lipid, cmp and eGFr, and vit D level this week, I will message you with results  Congrats on weight loss, keep it up  BP is excellent  Thanks for choosing [facility], we consider it a privelige to serve you.

## 2020-05-12 ENCOUNTER — Encounter: Payer: Self-pay | Admitting: Family Medicine

## 2020-05-12 DIAGNOSIS — B369 Superficial mycosis, unspecified: Secondary | ICD-10-CM | POA: Insufficient documentation

## 2020-05-12 LAB — COMPLETE METABOLIC PANEL WITH GFR
AG Ratio: 1.5 (calc) (ref 1.0–2.5)
ALT: 12 U/L (ref 6–29)
AST: 15 U/L (ref 10–35)
Albumin: 4 g/dL (ref 3.6–5.1)
Alkaline phosphatase (APISO): 83 U/L (ref 37–153)
BUN: 17 mg/dL (ref 7–25)
CO2: 30 mmol/L (ref 20–32)
Calcium: 9.8 mg/dL (ref 8.6–10.4)
Chloride: 105 mmol/L (ref 98–110)
Creat: 0.78 mg/dL (ref 0.50–1.05)
GFR, Est African American: 98 mL/min/{1.73_m2} (ref >=60)
GFR, Est Non African American: 85 mL/min/{1.73_m2} (ref >=60)
Globulin: 2.7 g/dL (calc) (ref 1.9–3.7)
Glucose, Bld: 97 mg/dL (ref 65–99)
Potassium: 4.4 mmol/L (ref 3.5–5.3)
Sodium: 142 mmol/L (ref 135–146)
Total Bilirubin: 0.4 mg/dL (ref 0.2–1.2)
Total Protein: 6.7 g/dL (ref 6.1–8.1)

## 2020-05-12 LAB — LIPID PANEL
Cholesterol: 186 mg/dL (ref <200)
HDL: 57 mg/dL (ref >=50)
LDL Cholesterol (Calc): 116 mg/dL (calc) — ABNORMAL HIGH
Non-HDL Cholesterol (Calc): 129 mg/dL (calc) (ref <130)
Total CHOL/HDL Ratio: 3.3 (calc) (ref <5.0)
Triglycerides: 51 mg/dL (ref <150)

## 2020-05-12 LAB — VITAMIN D 25 HYDROXY (VIT D DEFICIENCY, FRACTURES): Vit D, 25-Hydroxy: 33 ng/mL (ref 30–100)

## 2020-05-12 NOTE — Assessment & Plan Note (Signed)
1 week h/o RUQ pain and bloating, [place] to further eval

## 2020-05-12 NOTE — Assessment & Plan Note (Signed)
Hyperlipidemia:Low fat diet discussed and encouraged.   Lipid Panel  Lab Results  Component Value Date   CHOL 186 05/11/2020   HDL 57 05/11/2020   LDLCALC 116 (H) 05/11/2020   TRIG 51 05/11/2020   CHOLHDL 3.3 05/11/2020  needs to reduce fat in diet, improved

## 2020-05-12 NOTE — Assessment & Plan Note (Signed)
Topical med x 1 week , then as needed

## 2020-05-12 NOTE — Assessment & Plan Note (Signed)
  Patient re-educated about  the importance of commitment to a  minimum of 150 minutes of exercise per week as able.  The importance of healthy food choices with portion control discussed, as well as eating regularly and within a 12 hour window most days. The need to choose "clean , green" food 50 to 75% of the time is discussed, as well as to make water the primary drink and set a goal of 64 ounces water daily.    Weight /BMI 05/09/2020 01/26/2020 01/12/2020  WEIGHT 289 lb 0.6 oz 287 lb 283 lb  HEIGHT 5\' 6"  5\' 6"  5\' 6"   BMI 46.65 kg/m2 46.32 kg/m2 45.68 kg/m2

## 2020-05-12 NOTE — Assessment & Plan Note (Signed)
Needs colonoscopy in 2022 per gI. Denies change in stool caliber  Or bowel habit

## 2020-05-12 NOTE — Assessment & Plan Note (Signed)
Controlled, no change in medication DASH diet and commitment to daily physical activity for a minimum of 30 minutes discussed and encouraged, as a part of hypertension management. The importance of attaining a healthy weight is also discussed.  BP/Weight 05/09/2020 01/26/2020 01/12/2020 01/05/2020 04/01/2019 10/13/2018 3/94/3200  Systolic BP 379 444 619 012 224 114 643  Diastolic BP 80 76 73 78 82 82 80  Wt. (Lbs) 289.04 287 283 285.12 265 290 298.04  BMI 46.65 46.32 45.68 46.02 42.77 46.81 48.1

## 2020-05-15 ENCOUNTER — Other Ambulatory Visit: Payer: Self-pay | Admitting: *Deleted

## 2020-05-19 ENCOUNTER — Other Ambulatory Visit (HOSPITAL_COMMUNITY): Payer: BC Managed Care – PPO

## 2020-05-19 ENCOUNTER — Other Ambulatory Visit: Payer: Self-pay

## 2020-05-19 ENCOUNTER — Ambulatory Visit (HOSPITAL_COMMUNITY)
Admission: RE | Admit: 2020-05-19 | Discharge: 2020-05-19 | Disposition: A | Discharge status: Home / Self Care | Payer: BC Managed Care – PPO | Source: Ambulatory Visit | Attending: Family Medicine | Admitting: Family Medicine

## 2020-05-19 DIAGNOSIS — R140 Abdominal distension (gaseous): Secondary | ICD-10-CM

## 2020-05-19 DIAGNOSIS — R1011 Right upper quadrant pain: Secondary | ICD-10-CM | POA: Insufficient documentation

## 2020-05-21 NOTE — Progress Notes (Signed)
Result note in my chart comment patient calls

## 2020-06-22 ENCOUNTER — Encounter: Payer: Self-pay | Admitting: Family Medicine

## 2020-06-22 ENCOUNTER — Ambulatory Visit (INDEPENDENT_AMBULATORY_CARE_PROVIDER_SITE_OTHER): Payer: BC Managed Care – PPO | Admitting: Family Medicine

## 2020-06-22 ENCOUNTER — Other Ambulatory Visit: Payer: Self-pay

## 2020-06-22 VITALS — BP 138/80 | HR 77 | Resp 16 | Ht 66.0 in | Wt 290.0 lb

## 2020-06-22 DIAGNOSIS — Z566 Other physical and mental strain related to work: Secondary | ICD-10-CM

## 2020-06-22 DIAGNOSIS — R9402 Abnormal brain scan: Secondary | ICD-10-CM | POA: Diagnosis not present

## 2020-06-22 DIAGNOSIS — R42 Dizziness and giddiness: Secondary | ICD-10-CM

## 2020-06-22 DIAGNOSIS — J0100 Acute maxillary sinusitis, unspecified: Secondary | ICD-10-CM | POA: Diagnosis not present

## 2020-06-22 DIAGNOSIS — M62838 Other muscle spasm: Secondary | ICD-10-CM | POA: Diagnosis not present

## 2020-06-22 DIAGNOSIS — I10 Essential (primary) hypertension: Secondary | ICD-10-CM

## 2020-06-22 MED ORDER — DIAZEPAM 5 MG PO TABS: ORAL_TABLET | ORAL | 0 refills | Status: DC

## 2020-06-22 MED ORDER — MECLIZINE HCL 25 MG PO TABS: 25.0000 mg | ORAL_TABLET | Freq: Three times a day (TID) | ORAL | 0 refills | Status: DC | PRN

## 2020-06-22 MED ORDER — FLUCONAZOLE 150 MG PO TABS
150.0000 mg | ORAL_TABLET | Freq: Once | ORAL | 0 refills | Status: AC
Start: 2020-06-22 — End: 2020-06-22

## 2020-06-22 MED ORDER — AZITHROMYCIN 250 MG PO TABS: ORAL_TABLET | ORAL | 0 refills | Status: DC

## 2020-06-22 NOTE — Assessment & Plan Note (Addendum)
meeds Neurology follow up, question of MS in the remote past

## 2020-06-22 NOTE — Progress Notes (Signed)
   [name]     MRN: [id number]      DOB: [birth year]   HPI [name] is here  With a 1 week h/o frontal pressure, also developed vertigo which has lessened some, has mild nausea with the vertigo. Increased nasal drainage, thick , no sore throat , non productive cough C/o left neck spasm ROS Denies recent fever or chills. Denies sinus pressure, nasal congestion, ear pain or sore throat. Denies chest congestion, productive cough or wheezing. Denies chest pains, palpitations and leg swelling Denies abdominal pain, nausea, vomiting,diarrhea or constipation.   Denies dysuria, frequency, hesitancy or incontinence. . Denies headaches, seizures, numbness, or tingling. Denies depression,c/o increased  anxiety and stress which is mainly job relatedor insomnia. Denies skin break down or rash.   PE  BP 138/80   Pulse 77   Resp 16   Ht 5\' 6"  (1.676 m)   Wt 290 lb (131.5 kg)   LMP 10/19/2011 Comment: pt signed preg test waiver 06/23/17  SpO2 98%   BMI 46.81 kg/m   Patient alert and oriented and in no cardiopulmonary distress.  HEENT: No facial asymmetry, EOMI,     Left maxillary sinus tenderness, left neck spasm .  Chest: Clear to auscultation bilaterally.  CVS: S1, S2 no murmurs, no S3.Regular rate.  ABD: Soft non tender.   Ext: No edema  MS: Adequate ROM spine, shoulders, hips and knees.  Skin: Intact, no ulcerations or rash noted.  Psych: Good eye contact, normal affect. Memory intact  anxious not depressed appearing.  CNS: CN 2-12 intact, power,  normal throughout.no focal deficits noted.   Assessment & Plan  Abnormal brain scan meeds Neurology follow up, question of MS in the remote past  HTN (hypertension) Controlled, no change in medication DASH diet and commitment to daily physical activity for a minimum of 30 minutes discussed and encouraged, as a part of hypertension management. The importance of attaining a healthy weight is also discussed.  BP/Weight  06/22/2020 05/09/2020 01/26/2020 01/12/2020 01/05/2020 04/01/2019 93/07/1016  Systolic BP 510 258 527 782 423 536 144  Diastolic BP 80 80 76 73 78 82 82  Wt. (Lbs) 290 289.04 287 283 285.12 265 290  BMI 46.81 46.65 46.32 45.68 46.02 42.77 46.81       Morbid obesity  Patient re-educated about  the importance of commitment to a  minimum of 150 minutes of exercise per week as able.  The importance of healthy food choices with portion control discussed, as well as eating regularly and within a 12 hour window most days. The need to choose "clean , green" food 50 to 75% of the time is discussed, as well as to make water the primary drink and set a goal of 64 ounces water daily.    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

## 2020-06-22 NOTE — Patient Instructions (Addendum)
Follow-up as before call if you need me sooner.  Antibiotic is prescribed for sinus infection as well as medication for vertigo.  Short course of medication is prescribed for neck spasm.  You are referred again to Dr. Merlene Laughter for re- evaluation of abnormal brain scan suggestive of MS in the past.  I recommend speaking with your counselor through work to help to deal with the anxiety you feel which is job-related specifically.  It is important that you exercise regularly at least 30 minutes 5 times a week. If you develop chest pain, have severe difficulty breathing, or feel very tired, stop exercising immediately and seek medical attention  Think about what you will eat, plan ahead. Choose " clean, green, fresh or frozen" over canned, processed or packaged foods which are more sugary, salty and fatty. 70 to 75% of food eaten should be vegetables and fruit. Three meals at set times with snacks allowed between meals, but they must be fruit or vegetables. Aim to eat over a 12 hour period , example 7 am to 7 pm, and STOP after  your last meal of the day. Drink water,generally about 64 ounces per day, no other drink is as healthy. Fruit juice is best enjoyed in a healthy way, by EATING the fruit.

## 2020-06-25 ENCOUNTER — Encounter: Payer: Self-pay | Admitting: Family Medicine

## 2020-06-25 DIAGNOSIS — Z566 Other physical and mental strain related to work: Secondary | ICD-10-CM | POA: Insufficient documentation

## 2020-06-25 NOTE — Assessment & Plan Note (Signed)
  Patient re-educated about  the importance of commitment to a  minimum of 150 minutes of exercise per week as able.  The importance of healthy food choices with portion control discussed, as well as eating regularly and within a 12 hour window most days. The need to choose "clean , green" food 50 to 75% of the time is discussed, as well as to make water the primary drink and set a goal of 64 ounces water daily.    Weight /BMI 06/22/2020 05/09/2020 01/26/2020  WEIGHT 290 lb 289 lb 0.6 oz 287 lb  HEIGHT 5\' 6"  5\' 6"  5\' 6"   BMI 46.81 kg/m2 46.65 kg/m2 46.32 kg/m2

## 2020-06-25 NOTE — Assessment & Plan Note (Signed)
Controlled, no change in medication DASH diet and commitment to daily physical activity for a minimum of 30 minutes discussed and encouraged, as a part of hypertension management. The importance of attaining a healthy weight is also discussed.  BP/Weight 06/22/2020 05/09/2020 01/26/2020 01/12/2020 01/05/2020 04/01/2019 78/03/7840  Systolic BP 282 081 388 719 597 471 855  Diastolic BP 80 80 76 73 78 82 82  Wt. (Lbs) 290 289.04 287 283 285.12 265 290  BMI 46.81 46.65 46.32 45.68 46.02 42.77 46.81

## 2020-06-25 NOTE — Assessment & Plan Note (Signed)
Acute onset x 1 week, Antivert for as needed use

## 2020-06-25 NOTE — Assessment & Plan Note (Signed)
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

## 2020-06-25 NOTE — Assessment & Plan Note (Signed)
Muscle relaxant at bedtime for as needed use

## 2020-06-25 NOTE — Assessment & Plan Note (Signed)
Antibiotic prescribed and saline nasal flushes encouraged

## 2020-06-28 ENCOUNTER — Ambulatory Visit: Payer: BC Managed Care – PPO | Admitting: Family Medicine

## 2020-08-17 ENCOUNTER — Other Ambulatory Visit (HOSPITAL_COMMUNITY): Payer: Self-pay | Admitting: Neurology

## 2020-08-17 ENCOUNTER — Other Ambulatory Visit: Payer: Self-pay | Admitting: Neurology

## 2020-08-17 DIAGNOSIS — R200 Anesthesia of skin: Secondary | ICD-10-CM

## 2020-08-19 ENCOUNTER — Ambulatory Visit (HOSPITAL_COMMUNITY)
Admission: RE | Admit: 2020-08-19 | Discharge: 2020-08-19 | Disposition: A | Discharge status: Home / Self Care | Payer: BC Managed Care – PPO | Source: Ambulatory Visit | Attending: Emergency Medicine | Admitting: Emergency Medicine

## 2020-08-19 ENCOUNTER — Other Ambulatory Visit: Payer: Self-pay

## 2020-08-19 ENCOUNTER — Other Ambulatory Visit (HOSPITAL_COMMUNITY): Payer: Self-pay | Admitting: Emergency Medicine

## 2020-08-19 ENCOUNTER — Encounter (HOSPITAL_COMMUNITY): Payer: Self-pay | Admitting: Emergency Medicine

## 2020-08-19 ENCOUNTER — Emergency Department (HOSPITAL_COMMUNITY)
Admission: EM | Admit: 2020-08-19 | Discharge: 2020-08-19 | Disposition: A | Discharge status: Home / Self Care | Payer: BC Managed Care – PPO | Attending: Emergency Medicine | Admitting: Emergency Medicine

## 2020-08-19 DIAGNOSIS — I10 Essential (primary) hypertension: Secondary | ICD-10-CM | POA: Insufficient documentation

## 2020-08-19 DIAGNOSIS — M79662 Pain in left lower leg: Secondary | ICD-10-CM

## 2020-08-19 DIAGNOSIS — M79605 Pain in left leg: Secondary | ICD-10-CM | POA: Diagnosis present

## 2020-08-19 DIAGNOSIS — Z79899 Other long term (current) drug therapy: Secondary | ICD-10-CM | POA: Diagnosis not present

## 2020-08-19 MED ORDER — HYDROCODONE-ACETAMINOPHEN 5-325 MG PO TABS: 1.0000 | ORAL_TABLET | Freq: Four times a day (QID) | ORAL | 0 refills | Status: DC | PRN

## 2020-08-19 NOTE — ED Triage Notes (Signed)
Patient complains of left leg pain that started Wednesday of this week. Patient denies any known injury but states that she has been exercising. Patient states she has been told by physician that she needs a knee replacement.

## 2020-08-19 NOTE — ED Provider Notes (Signed)
Pt presents for results of dvt study.  Study is negative for dvt, discussed w pt.   Rec pcp f/u.    Lajean Saver, MD 08/19/20 [phone]

## 2020-08-19 NOTE — Discharge Instructions (Addendum)
Return to [facility] 08/19/2020 at 08:45am to register for Ultrasound. Register at the ED Admitting desk.   Take hydrocodone as prescribed as needed for pain.  Rest.  Follow-up with your orthopedic surgeon if not improving in the next week.

## 2020-08-19 NOTE — ED Provider Notes (Signed)
[facility] EMERGENCY DEPARTMENT Provider Note   CSN: [id number] Arrival date & time: 08/19/20  2671     History Chief Complaint  Patient presents with  . Leg Pain    left    [name] is a 59 y.o. female.  Patient is a 59 year old female with history of osteoarthritis, hypertension, and osteoarthritis of both knees.  Patient presents today for evaluation of left leg pain.  She describes a 2-day history of pain to the lateral aspect of the left calf.  This began in the absence of any specific injury or trauma.  Her pain is worse with ambulation and relieved with rest.  She denies any chest pain or shortness of breath.  Patient reports a history of Baker's cyst in the past in the right leg and this feels similar.  The history is provided by the patient.  Leg Pain Lower extremity pain location: left knee/calf. Time since incident:  2 days Pain details:    Quality:  Aching   Radiates to:  Does not radiate   Severity:  Moderate   Timing:  Constant   Progression:  Worsening Chronicity:  New      Past Medical History:  Diagnosis Date  . Hypertension   . Joint pain   . Left knee pain   . Pain of right clavicle 10/17/2018  . Seasonal allergies     Patient Active Problem List   Diagnosis Date Noted  . Stress at work 06/25/2020  . 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 Tubular adenoma of colon 01/05/2020  . Abnormal brain scan 04/04/2019  . 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 Chronic right shoulder pain 10/17/2018  . Vertigo 11/16/2017  . Maxillary sinusitis, acute 04/12/2014  . Dyslipidemia 07/05/2013  . Morbid obesity ([place]) 06/28/2013  . HTN (hypertension) 05/28/2012  . Neck muscle spasm 05/18/2012  . Sleep-disordered breathing 06/27/2010  . Allergic rhinitis 02/02/2010  . Goiter 12/20/2008  . GERD 04/06/2008    Past Surgical History:  Procedure Laterality Date  . COLONOSCOPY N/A 09/15/2014   Procedure: COLONOSCOPY;  Surgeon: Rogene Houston, MD;   Location: [facility] END[facility];  Service: Endoscopy;  Laterality: N/A;  830  . Cyst removed from buttock  1980's     OB History    Gravida  2   Para  2   Term      Preterm      AB      Living        SAB      TAB      Ectopic      Multiple      Live Births              Family History  Problem Relation Age of Onset  . Diabetes Mother   . High blood pressure Mother   . Colon cancer Neg Hx     Social History   Tobacco Use  . Smoking status: Never Smoker  . Smokeless tobacco: Never Used  Vaping Use  . Vaping Use: Never assessed  Substance Use Topics  . Alcohol use: No  . Drug use: No    Home Medications Prior to Admission medications   Medication Sig Start Date End Date Taking? Authorizing Provider  amLODipine (NORVASC) 10 MG tablet Take 1 tablet (10 mg total) by mouth daily. 01/05/20   Fayrene Helper, MD  azithromycin (ZITHROMAX) 250 MG tablet Take two tablets by mouth on day one, then take one tablet once  daily for an additional four days 06/22/20   Fayrene Helper, MD  Biotin 10000 MCG TABS Take 1 tablet by mouth daily.    [provider]  cetirizine (ZYRTEC) 10 MG tablet Take 10 mg by mouth at bedtime.    [provider]  clotrimazole-betamethasone (LOTRISONE) cream Apply 1 application topically 2 (two) times daily. 05/09/20   Fayrene Helper, MD  cyanocobalamin 100 MCG tablet Take 100 mcg by mouth daily.    [provider]  diazepam (VALIUM) 5 MG tablet Take 1 tablet at bedtime as needed for neck spasm 06/22/20   Fayrene Helper, MD  fluticasone [facility]) 50 MCG/ACT nasal spray Place 2 sprays into both nostrils daily. 10/12/19   Fayrene Helper, MD  meclizine (ANTIVERT) 25 MG tablet Take 1 tablet (25 mg total) by mouth 3 (three) times daily as needed for dizziness. 06/22/20   Fayrene Helper, MD  Multiple Vitamin (MULTIVITAMIN) tablet Take 1 tablet by mouth daily.      [provider]  omega-3 acid ethyl  esters (LOVAZA) 1 g capsule Take by mouth 2 (two) times daily.    [provider]  vitamin C (ASCORBIC ACID) 500 MG tablet Take 500 mg by mouth daily.    [provider]  Vitamin D, Ergocalciferol, (DRISDOL) 1.25 MG (50000 UNIT) CAPS capsule Take 1 capsule (50,000 Units total) by mouth every 7 (seven) days. 01/26/20   Dennard Nip D, MD  zinc gluconate 50 MG tablet Take 50 mg by mouth daily.    [provider]    Allergies    Sulfamethoxazole-trimethoprim  Review of Systems   Review of Systems  All other systems reviewed and are negative.   Physical Exam Updated Vital Signs BP 133/83 (BP Location: Right Arm)   Pulse 88   Temp 98.2 F (36.8 C) (Oral)   Resp 18   Ht 5\' 6"  (1.676 m)   Wt 127 kg   LMP 10/19/2011 Comment: pt signed preg test waiver 06/23/17  SpO2 100%   BMI 45.19 kg/m   Physical Exam Vitals and nursing note reviewed.  Constitutional:      General: She is not in acute distress.    Appearance: Normal appearance. She is not ill-appearing, toxic-appearing or diaphoretic.  HENT:     Head: Normocephalic and atraumatic.  Pulmonary:     Effort: Pulmonary effort is normal.  Musculoskeletal:     Comments: The left knee appears grossly normal.  There is no obvious effusion.  She has ttp to the soft tissues of the left lateral calf.  There is no palpable cord.  Bevelyn Buckles' sign is absent.  Distal PMS is intact to the left lower leg.  Skin:    General: Skin is warm and dry.  Neurological:     Mental Status: She is alert and oriented to person, place, and time.     ED Results / Procedures / Treatments   Labs (all labs ordered are listed, but only abnormal results are displayed) Labs Reviewed - No data to display  EKG None  Radiology No results found.  Procedures Procedures (including critical care time)  Medications Ordered in ED Medications - No data to display  ED Course  I have reviewed the triage vital signs and the nursing  notes.  Pertinent labs & imaging results that were available during my care of the patient were reviewed by me and considered in my medical decision making (see chart for details).    MDM Rules/Calculators/A&P  Patient with left calf pain of uncertain etiology.  Symptoms are most likely musculoskeletal in nature, however I feel an ultrasound to rule out DVT is indicated.  We do not have this coverage this weekend and arrangements will be made for patient to have this done at the vascular lab at [facility] this morning.  Patient will be given medicine for pain.  Final Clinical Impression(s) / ED Diagnoses Final diagnoses:  None    Rx / DC Orders ED Discharge Orders    None       Veryl Speak, MD 08/19/20 [phone]

## 2020-08-30 ENCOUNTER — Ambulatory Visit (HOSPITAL_COMMUNITY)
Admission: RE | Admit: 2020-08-30 | Discharge: 2020-08-30 | Disposition: A | Discharge status: Home / Self Care | Payer: BC Managed Care – PPO | Source: Ambulatory Visit | Attending: Neurology | Admitting: Neurology

## 2020-08-30 DIAGNOSIS — R200 Anesthesia of skin: Secondary | ICD-10-CM | POA: Diagnosis present

## 2020-10-09 ENCOUNTER — Ambulatory Visit: Payer: BC Managed Care – PPO | Admitting: Family Medicine

## 2020-10-29 ENCOUNTER — Other Ambulatory Visit: Payer: Self-pay

## 2020-10-29 ENCOUNTER — Encounter (HOSPITAL_COMMUNITY): Payer: Self-pay | Admitting: Emergency Medicine

## 2020-10-29 ENCOUNTER — Emergency Department (HOSPITAL_COMMUNITY): Payer: BC Managed Care – PPO

## 2020-10-29 ENCOUNTER — Emergency Department (HOSPITAL_COMMUNITY)
Admission: EM | Admit: 2020-10-29 | Discharge: 2020-10-29 | Disposition: A | Discharge status: Home / Self Care | Payer: BC Managed Care – PPO | Attending: Emergency Medicine | Admitting: Emergency Medicine

## 2020-10-29 DIAGNOSIS — R0789 Other chest pain: Secondary | ICD-10-CM | POA: Insufficient documentation

## 2020-10-29 DIAGNOSIS — I10 Essential (primary) hypertension: Secondary | ICD-10-CM | POA: Diagnosis not present

## 2020-10-29 DIAGNOSIS — R531 Weakness: Secondary | ICD-10-CM | POA: Insufficient documentation

## 2020-10-29 DIAGNOSIS — Z79899 Other long term (current) drug therapy: Secondary | ICD-10-CM | POA: Insufficient documentation

## 2020-10-29 DIAGNOSIS — R42 Dizziness and giddiness: Secondary | ICD-10-CM | POA: Insufficient documentation

## 2020-10-29 DIAGNOSIS — R079 Chest pain, unspecified: Secondary | ICD-10-CM

## 2020-10-29 LAB — BASIC METABOLIC PANEL
Anion gap: 6 (ref 5–15)
BUN: 17 mg/dL (ref 6–20)
CO2: 27 mmol/L (ref 22–32)
Calcium: 9.2 mg/dL (ref 8.9–10.3)
Chloride: 108 mmol/L (ref 98–111)
Creatinine, Ser: 0.86 mg/dL (ref 0.44–1.00)
GFR, Estimated: >60 mL/min (ref >60)
Glucose, Bld: 110 mg/dL — ABNORMAL HIGH (ref 70–99)
Potassium: 3.5 mmol/L (ref 3.5–5.1)
Sodium: 141 mmol/L (ref 135–145)

## 2020-10-29 LAB — CBC
HCT: 36.5 % (ref 36.0–46.0)
Hemoglobin: 11.2 g/dL — ABNORMAL LOW (ref 12.0–15.0)
MCH: 29.6 pg (ref 26.0–34.0)
MCHC: 30.7 g/dL (ref 30.0–36.0)
MCV: 96.6 fL (ref 80.0–100.0)
Platelets: 285 10*3/uL (ref 150–400)
RBC: 3.78 MIL/uL — ABNORMAL LOW (ref 3.87–5.11)
RDW: 12.6 % (ref 11.5–15.5)
WBC: 7.5 10*3/uL (ref 4.0–10.5)
nRBC: 0 % (ref 0.0–0.2)

## 2020-10-29 LAB — CBG MONITORING, ED: Glucose-Capillary: 105 mg/dL — ABNORMAL HIGH (ref 70–99)

## 2020-10-29 LAB — TROPONIN I (HIGH SENSITIVITY)
Troponin I (High Sensitivity): 5 ng/L (ref <18)
Troponin I (High Sensitivity): 4 ng/L (ref <18)

## 2020-10-29 LAB — BRAIN NATRIURETIC PEPTIDE: B Natriuretic Peptide: 41 pg/mL (ref 0.0–100.0)

## 2020-10-29 MED ORDER — ASPIRIN 81 MG PO CHEW
324.0000 mg | CHEWABLE_TABLET | Freq: Once | ORAL | Status: AC
  Administered 2020-10-29: 324 mg via ORAL
  Filled 2020-10-29: qty 4

## 2020-10-29 NOTE — ED Triage Notes (Signed)
Pt complains of rt sided chest pain that began at church today. Patient describes the pain as pressure that radiates to her back on the right side shoulder blade area.

## 2020-10-29 NOTE — ED Provider Notes (Signed)
[facility] EMERGENCY DEPARTMENT Provider Note   CSN: [id number] Arrival date & time: 10/29/20  1722     History Chief Complaint  Patient presents with  . Chest Pain    [name] is a 59 y.o. female.  The history is provided by the patient.    HPI: A 59 year old patient with a history of hypertension and obesity presents for evaluation of chest pain. Initial onset of pain was more than 6 hours ago. The patient's chest pain is described as heaviness/pressure/tightness and is not worse with exertion. The patient's chest pain is not middle- or left-sided, is not well-localized, is not sharp and does not radiate to the arms/jaw/neck. The patient does not complain of nausea and denies diaphoresis. The patient has no history of stroke, has no history of peripheral artery disease, has not smoked in the past 90 days, denies any history of treated diabetes, has no relevant family history of coronary artery disease (first degree relative at less than age 59) and has no history of hypercholesterolemia.    Pt describes 3 episodes of right sided chest heaviness with radiation into the right scapular area since yesterday, the longest occurrence happened this am while standing singing in the choir at church, lasting about 15 minutes.  She had associated generalized weakness with the event, became hot (although it was warm in church) and briefly lightheaded.  She denies sob, palpitations or pleuritic pain.  Past Medical History:  Diagnosis Date  . Hypertension   . Joint pain   . Left knee pain   . Pain of right clavicle 10/17/2018  . Seasonal allergies     Patient Active Problem List   Diagnosis Date Noted  . Stress at work 06/25/2020  . 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 Tubular adenoma of colon 01/05/2020  . Abnormal brain scan 04/04/2019  . 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 Chronic right shoulder pain 10/17/2018  . Vertigo 11/16/2017  . Maxillary sinusitis, acute  04/12/2014  . Dyslipidemia 07/05/2013  . Morbid obesity ([place]) 06/28/2013  . HTN (hypertension) 05/28/2012  . Neck muscle spasm 05/18/2012  . Sleep-disordered breathing 06/27/2010  . Allergic rhinitis 02/02/2010  . Goiter 12/20/2008  . GERD 04/06/2008    Past Surgical History:  Procedure Laterality Date  . COLONOSCOPY N/A 09/15/2014   Procedure: COLONOSCOPY;  Surgeon: Rogene Houston, MD;  Location: [facility];  Service: Endoscopy;  Laterality: N/A;  830  . Cyst removed from buttock  1980's     OB History    Gravida  2   Para  2   Term      Preterm      AB      Living        SAB      TAB      Ectopic      Multiple      Live Births              Family History  Problem Relation Age of Onset  . Diabetes Mother   . High blood pressure Mother   . Colon cancer Neg Hx     Social History   Tobacco Use  . Smoking status: Never Smoker  . Smokeless tobacco: Never Used  Vaping Use  . Vaping Use: Never used  Substance Use Topics  . Alcohol use: No  . Drug use: No    Home Medications Prior to Admission medications   Medication Sig Start Date End Date Taking?  Authorizing Provider  amLODipine (NORVASC) 10 MG tablet Take 1 tablet (10 mg total) by mouth daily. 01/05/20  Yes Fayrene Helper, MD  Biotin 10000 MCG TABS Take 1 tablet by mouth daily.   Yes [provider]  cetirizine (ZYRTEC) 10 MG tablet Take 10 mg by mouth at bedtime.   Yes [provider]  Cholecalciferol (VITAMIN D3) 10 MCG (400 UNIT) CAPS Take 1 capsule by mouth daily.   Yes [provider]  cyanocobalamin 100 MCG tablet Take 100 mcg by mouth daily.   Yes [provider]  diazepam (VALIUM) 5 MG tablet Take 1 tablet at bedtime as needed for neck spasm 06/22/20  Yes Fayrene Helper, MD  Diclofenac Sodium 3 % GEL Apply 1 application topically daily as needed.    Yes [provider]  influenza vac split quadrivalent PF (AFLURIA QUADRIVALENT) 0.5  ML injection Inject 0.5 mLs into the muscle once.    Yes [provider]  meclizine (ANTIVERT) 25 MG tablet Take 1 tablet (25 mg total) by mouth 3 (three) times daily as needed for dizziness. 06/22/20  Yes Fayrene Helper, MD  Multiple Vitamin (MULTIVITAMIN) tablet Take 1 tablet by mouth daily.     Yes [provider]  omega-3 acid ethyl esters (LOVAZA) 1 g capsule Take by mouth 2 (two) times daily.   Yes [provider]  ondansetron (ZOFRAN) 4 MG tablet Take 4 mg by mouth daily as needed for nausea or vomiting.    Yes [provider]  vitamin C (ASCORBIC ACID) 500 MG tablet Take 500 mg by mouth daily.   Yes [provider]  zinc gluconate 50 MG tablet Take 50 mg by mouth daily.   Yes [provider]  azithromycin (ZITHROMAX) 250 MG tablet Take two tablets by mouth on day one, then take one tablet once daily for an additional four days Patient not taking: Reported on 10/29/2020 06/22/20   Fayrene Helper, MD  clotrimazole-betamethasone (LOTRISONE) cream Apply 1 application topically 2 (two) times daily. Patient not taking: Reported on 10/29/2020 05/09/20   Fayrene Helper, MD  fluconazole (DIFLUCAN) 150 MG tablet Take 150 mg by mouth once. Patient not taking: Reported on 10/29/2020 06/22/20   [provider]  fluticasone (FLONASE) 50 MCG/ACT nasal spray Place 2 sprays into both nostrils daily. Patient not taking: Reported on 10/29/2020 10/12/19   Fayrene Helper, MD  HYDROcodone-acetaminophen [facility]) 5-325 MG tablet Take 1-2 tablets by mouth every 6 (six) hours as needed. Patient not taking: Reported on 10/29/2020 08/19/20   Veryl Speak, MD  phentermine (ADIPEX-P) 37.5 MG tablet phentermine 37.5 mg tablet  TAKE 1 TABLET BY MOUTH ONCE DAILY BEFORE BREAKFAST Patient not taking: Reported on 10/29/2020    [provider]  traMADol (ULTRAM) 50 MG tablet tramadol 50 mg tablet  Take 1 tablet every 6 hours by oral route  as needed. Patient not taking: Reported on 10/29/2020    [provider]    Allergies    Sulfamethoxazole-trimethoprim  Review of Systems   Review of Systems  Constitutional: Negative for chills and fever.  HENT: Negative.   Eyes: Negative.   Respiratory: Positive for chest tightness. Negative for shortness of breath.   Cardiovascular: Negative for chest pain, palpitations and leg swelling.  Gastrointestinal: Negative for abdominal pain, nausea and vomiting.  Genitourinary: Negative.   Musculoskeletal: Negative for arthralgias, joint swelling and neck pain.  Skin: Negative.  Negative for rash and wound.  Neurological: Positive for  light-headedness. Negative for dizziness, weakness, numbness and headaches.  Psychiatric/Behavioral: Negative.   All other systems reviewed and are negative.   Physical Exam Updated Vital Signs BP 124/74   Pulse 78   Temp 98.5 F (36.9 C) (Oral)   Resp 13   Ht 5\' 6"  (1.676 m)   Wt 122.5 kg   LMP 10/19/2011 Comment: pt signed preg test waiver 06/23/17  SpO2 98%   BMI 43.58 kg/m   Physical Exam Vitals and nursing note reviewed.  Constitutional:      Appearance: She is well-developed.  HENT:     Head: Normocephalic and atraumatic.  Eyes:     Conjunctiva/sclera: Conjunctivae normal.  Cardiovascular:     Rate and Rhythm: Normal rate and regular rhythm.     Heart sounds: Normal heart sounds.  Pulmonary:     Effort: Pulmonary effort is normal.     Breath sounds: Normal breath sounds. No wheezing.  Abdominal:     General: Bowel sounds are normal.     Palpations: Abdomen is soft.     Tenderness: There is no abdominal tenderness.  Musculoskeletal:        General: Normal range of motion.     Cervical back: Normal range of motion.     Right lower leg: No tenderness. No edema.     Left lower leg: No tenderness. No edema.  Skin:    General: Skin is warm and dry.  Neurological:     Mental Status: She is alert.     ED Results /  Procedures / Treatments   Labs (all labs ordered are listed, but only abnormal results are displayed) Labs Reviewed  BASIC METABOLIC PANEL - Abnormal; Notable for the following components:      Result Value   Glucose, Bld 110 (*)    All other components within normal limits  CBC - Abnormal; Notable for the following components:   RBC 3.78 (*)    Hemoglobin 11.2 (*)    All other components within normal limits  CBG MONITORING, ED - Abnormal; Notable for the following components:   Glucose-Capillary 105 (*)    All other components within normal limits  BRAIN NATRIURETIC PEPTIDE  TROPONIN I (HIGH SENSITIVITY)  TROPONIN I (HIGH SENSITIVITY)    EKG EKG Interpretation  Date/Time:  Sunday October 29 2020 17:40:17 EST Ventricular Rate:  95 PR Interval:    QRS Duration: 94 QT Interval:  368 QTC Calculation: 463 R Axis:   1 Text Interpretation: Sinus rhythm Confirmed by Fredia Sorrow [phone]) on 10/29/2020 6:32:16 PM   Radiology DG Chest Portable 1 View  Result Date: 10/29/2020 CLINICAL DATA:  Chest pain EXAM: PORTABLE CHEST 1 VIEW COMPARISON:  June 23, 2017 FINDINGS: The heart size and mediastinal contours are within normal limits. There is prominence of the central pulmonary vasculature. The visualized skeletal structures are unremarkable. IMPRESSION: Mild pulmonary vascular congestion. Electronically Signed   By: Prudencio Pair M.D.   On: 10/29/2020 19:10    Procedures Procedures (including critical care time)  Medications Ordered in ED Medications  aspirin chewable tablet 324 mg (324 mg Oral Given 10/29/20 1845)    ED Course  I have reviewed the triage vital signs and the nursing notes.  Pertinent labs & imaging results that were available during my care of the patient were reviewed by me and considered in my medical decision making (see chart for details).    MDM Rules/Calculators/A&P HEAR Score: 2  Pt with a heart score of 2 and negative  delta troponins.  CXR suggesting vascular congestion, bnp added and negative, not CHF. No pleuritic component, no sob, doubt PE.  Atypical chest pressure, resolved, reassuring that this is not unstable ACS.  Sx free during ed stay. Appropriate for outpt f/u with cardiology, referral given.   The patient appears reasonably screened and/or stabilized for discharge and I doubt any other medical condition or other [facility] requiring further screening, evaluation, or treatment in the ED at this time prior to discharge.  Final Clinical Impression(s) / ED Diagnoses Final diagnoses:  Nonspecific chest pain    Rx / DC Orders ED Discharge Orders    None       Landis Martins 10/30/20 4315    Fredia Sorrow, MD 11/14/20 [phone]

## 2020-10-29 NOTE — Discharge Instructions (Addendum)
Your lab tests, EKG, chest x-ray and exam are reassuring tonight.  I do however recommend follow-up care with a cardiologist for consideration of cardiac stress testing as discussed.  In the interim return here for any new or escalating symptoms.

## 2020-10-30 NOTE — Progress Notes (Signed)
CARDIOLOGY CONSULT NOTE       Patient ID: [name] MRN: [id number] DOB/AGE: Jul 29, 1961 59 y.o.  Admit date: (Not on file) Referring Physician: Moshe Cipro Primary Physician: Fayrene Helper, MD Primary Cardiologist: New Reason for Consultation: Chest Pain   Active Problems:   * No active hospital problems. *   HPI:  59 y.o. referred by Dr Moshe Cipro for chest pain. CRFls include HTN She is taking Adipex/phentermine for weight loss Seen distantly by Cardiology PA in 2017 for palpitations with normal echo at that time Monitor with rare self limited NSVT only 8 beats Seen at [facility] ED 10/29/20 with atypical chest pain. Heaviness/Pressure not well localized in chest no radiation  Had 2nd episode also atypical right sided with radiation to scapular occurred while standing and singing in church she got hot and felt lightheaded and weak   ER visit r/o no acute ECG changes normal CXR ECG NSR rate 95 normal Troponin negative x 2 BNP normal   Pains in chest atypical funny feeling radiating to shoulders and pressure Sometimes seems to have vagal warm feeling with episodes    ROS All other systems reviewed and negative except as noted above  Past Medical History:  Diagnosis Date  . Hypertension   . Joint pain   . Left knee pain   . Pain of right clavicle 10/17/2018  . Seasonal allergies     Family History  Problem Relation Age of Onset  . Diabetes Mother   . High blood pressure Mother   . Colon cancer Neg Hx     Social History   Socioeconomic History  . Marital status: Married    Spouse name: Not on file  . Number of children: Not on file  . Years of education: Not on file  . Highest education level: Not on file  Occupational History  . Occupation: Pharmacist, hospital  Tobacco Use  . Smoking status: Never Smoker  . Smokeless tobacco: Never Used  Vaping Use  . Vaping Use: Never used  Substance and Sexual Activity  . Alcohol use: No  . Drug use: No  . Sexual activity: Yes  Other  Topics Concern  . Not on file  Social History Narrative  . Not on file   Social Determinants of Health   Financial Resource Strain:   . Difficulty of Paying Living Expenses: Not on file  Food Insecurity:   . Worried About Charity fundraiser in the Last Year: Not on file  . Ran Out of Food in the Last Year: Not on file  Transportation Needs:   . Lack of Transportation (Medical): Not on file  . Lack of Transportation (Non-Medical): Not on file  Physical Activity:   . Days of Exercise per Week: Not on file  . Minutes of Exercise per Session: Not on file  Stress:   . Feeling of Stress : Not on file  Social Connections:   . Frequency of Communication with Friends and Family: Not on file  . Frequency of Social Gatherings with Friends and Family: Not on file  . Attends Religious Services: Not on file  . Active Member of Clubs or Organizations: Not on file  . Attends Archivist Meetings: Not on file  . Marital Status: Not on file  Intimate Partner Violence:   . Fear of Current or Ex-Partner: Not on file  . Emotionally Abused: Not on file  . Physically Abused: Not on file  . Sexually Abused: Not on file  Past Surgical History:  Procedure Laterality Date  . COLONOSCOPY N/A 09/15/2014   Procedure: COLONOSCOPY;  Surgeon: Rogene Houston, MD;  Location: [facility];  Service: Endoscopy;  Laterality: N/A;  830  . Cyst removed from buttock  1980's      Current Outpatient Medications:  .  amLODipine (NORVASC) 10 MG tablet, Take 1 tablet (10 mg total) by mouth daily., Disp: 90 tablet, Rfl: 3 .  azithromycin (ZITHROMAX) 250 MG tablet, Take two tablets by mouth on day one, then take one tablet once daily for an additional four days, Disp: 6 tablet, Rfl: 0 .  Biotin 10000 MCG TABS, Take 1 tablet by mouth daily., Disp: , Rfl:  .  cetirizine (ZYRTEC) 10 MG tablet, Take 10 mg by mouth at bedtime., Disp: , Rfl:  .  Cholecalciferol (VITAMIN D3) 10 MCG (400 UNIT) CAPS, Take 1  capsule by mouth daily., Disp: , Rfl:  .  clotrimazole-betamethasone (LOTRISONE) cream, Apply 1 application topically 2 (two) times daily., Disp: 45 g, Rfl: 1 .  cyanocobalamin 100 MCG tablet, Take 100 mcg by mouth daily., Disp: , Rfl:  .  diazepam (VALIUM) 5 MG tablet, Take 1 tablet at bedtime as needed for neck spasm, Disp: 10 tablet, Rfl: 0 .  fluticasone (FLONASE) 50 MCG/ACT nasal spray, Place 2 sprays into both nostrils daily., Disp: 16 g, Rfl: 3 .  HYDROcodone-acetaminophen (NORCO) 5-325 MG tablet, Take 1-2 tablets by mouth every 6 (six) hours as needed., Disp: 12 tablet, Rfl: 0 .  meclizine (ANTIVERT) 25 MG tablet, Take 1 tablet (25 mg total) by mouth 3 (three) times daily as needed for dizziness., Disp: 30 tablet, Rfl: 0 .  Multiple Vitamin (MULTIVITAMIN) tablet, Take 1 tablet by mouth daily.  , Disp: , Rfl:  .  omega-3 acid ethyl esters (LOVAZA) 1 g capsule, Take by mouth 2 (two) times daily., Disp: , Rfl:  .  traMADol (ULTRAM) 50 MG tablet, tramadol 50 mg tablet  Take 1 tablet every 6 hours by oral route as needed., Disp: , Rfl:  .  vitamin C (ASCORBIC ACID) 500 MG tablet, Take 500 mg by mouth daily., Disp: , Rfl:  .  zinc gluconate 50 MG tablet, Take 50 mg by mouth daily., Disp: , Rfl:     Physical Exam: Height 5\' 6"  (1.676 m), last menstrual period 10/19/2011.    Affect appropriate Overweight black female  HEENT: normal Neck supple with no adenopathy JVP normal no bruits no thyromegaly Lungs clear with no wheezing and good diaphragmatic motion Heart:  S1/S2 no murmur, no rub, gallop or click PMI normal Abdomen: benighn, BS positve, no tenderness, no AAA no bruit.  No HSM or HJR Distal pulses intact with no bruits No edema Neuro non-focal Skin warm and dry No muscular weakness  Labs:   Lab Results  Component Value Date   WBC 7.5 10/29/2020   HGB 11.2 (L) 10/29/2020   HCT 36.5 10/29/2020   MCV 96.6 10/29/2020   PLT 285 10/29/2020    No results for input(s): NA,  K, CL, CO2, BUN, CREATININE, CALCIUM, PROT, BILITOT, ALKPHOS, ALT, AST, GLUCOSE in the last 168 hours.  Invalid input(s): LABALBU No results found for: CKTOTAL, CKMB, CKMBINDEX, TROPONINI  Lab Results  Component Value Date   CHOL 186 05/11/2020   CHOL 207 (H) 01/05/2020   CHOL 172 06/22/2018   Lab Results  Component Value Date   HDL 57 05/11/2020   HDL 65 01/05/2020   HDL 57 06/22/2018   Lab  Results  Component Value Date   LDLCALC 116 (H) 05/11/2020   LDLCALC 128 (H) 01/05/2020   LDLCALC 99 06/22/2018   Lab Results  Component Value Date   TRIG 51 05/11/2020   TRIG 53 01/05/2020   TRIG 74 06/22/2018   Lab Results  Component Value Date   CHOLHDL 3.3 05/11/2020   CHOLHDL 3.2 01/05/2020   CHOLHDL 3.0 06/22/2018   No results found for: LDLDIRECT    Radiology: DG Chest Portable 1 View  Result Date: 10/29/2020 CLINICAL DATA:  Chest pain EXAM: PORTABLE CHEST 1 VIEW COMPARISON:  June 23, 2017 FINDINGS: The heart size and mediastinal contours are within normal limits. There is prominence of the central pulmonary vasculature. The visualized skeletal structures are unremarkable. IMPRESSION: Mild pulmonary vascular congestion. Electronically Signed   By: Prudencio Pair M.D.   On: 10/29/2020 19:10    EKG: see HPI   ASSESSMENT AND PLAN:   1. Chest pain:  Atypical normal ECG discussed options and favor cardiac CTA  2. HTN:  Well controlled.  Continue current medications and low sodium Dash type diet.   3. Obesity: did not encourage continued use of Adipex which as been d/c      F/U with cardiology PRN if tests normal   Signed: Jenkins Rouge 11/08/2020, 4:13 PM

## 2020-11-08 ENCOUNTER — Other Ambulatory Visit: Payer: Self-pay

## 2020-11-08 ENCOUNTER — Encounter: Payer: Self-pay | Admitting: Cardiovascular Disease

## 2020-11-08 ENCOUNTER — Ambulatory Visit: Payer: BC Managed Care – PPO | Admitting: Cardiovascular Disease

## 2020-11-08 VITALS — BP 130/80 | HR 87 | Ht 66.0 in | Wt 286.2 lb

## 2020-11-08 DIAGNOSIS — E785 Hyperlipidemia, unspecified: Secondary | ICD-10-CM | POA: Diagnosis not present

## 2020-11-08 DIAGNOSIS — R079 Chest pain, unspecified: Secondary | ICD-10-CM | POA: Diagnosis not present

## 2020-11-08 DIAGNOSIS — I10 Essential (primary) hypertension: Secondary | ICD-10-CM

## 2020-11-08 MED ORDER — METOPROLOL TARTRATE 100 MG PO TABS: ORAL_TABLET | ORAL | 0 refills | Status: DC

## 2020-11-08 NOTE — Patient Instructions (Addendum)
Medication Instructions:  *If you need a refill on your cardiac medications before your next appointment, please call your pharmacy*  Lab Work: If you have labs (blood work) drawn today and your tests are completely normal, you will receive your results only by: Marland Kitchen MyChart Message (if you have MyChart) OR . A paper copy in the mail If you have any lab test that is abnormal or we need to change your treatment, we will call you to review the results.  Testing/Procedures: Your physician has requested that you have cardiac CT. Cardiac computed tomography (CT) is a painless test that uses an x-ray machine to take clear, detailed pictures of your heart. For further information please visit [email or web address].[email or web address]. Please follow instruction sheet as given.  Follow-Up: At [facility], you and your health needs are our priority.  As part of our continuing mission to provide you with exceptional heart care, we have created designated Provider Care Teams.  These Care Teams include your primary Cardiologist (physician) and Advanced Practice Providers (APPs -  Physician Assistants and Nurse Practitioners) who all work together to provide you with the care you need, when you need it.  We recommend signing up for the patient portal called "MyChart".  Sign up information is provided on this After Visit Summary.  MyChart is used to connect with patients for Virtual Visits (Telemedicine).  Patients are able to view lab/test results, encounter notes, upcoming appointments, etc.  Non-urgent messages can be sent to your provider as well.   To learn more about what you can do with MyChart, go to [email or web address].    Your next appointment:   As needed  The format for your next appointment:   In Person  Provider:   You may see Dr. Johnsie Cancel or one of the following Advanced Practice Providers on your designated Care Team:    Truitt Merle, NP  Cecilie Kicks, NP  Kathyrn Drown, NP  Your cardiac CT will be  scheduled at one of the below locations:   [facility] [address] [place], [place] [address] [phone]  [place] [address] [place], [place] [address] [phone]  If scheduled at [facility], please arrive at the [facility] main entrance of [facility] 30 minutes prior to test start time. Proceed to the [facility] Radiology Department (first floor) to check-in and test prep.  If scheduled at [facility], please arrive 15 mins early for check-in and test prep.  Please follow these instructions carefully (unless otherwise directed):  On the Night Before the Test: . Be sure to Drink plenty of water. . Do not consume any caffeinated/decaffeinated beverages or chocolate 12 hours prior to your test. . Do not take any antihistamines 12 hours prior to your test.  On the Day of the Test: . Drink plenty of water. Do not drink any water within one hour of the test. . Do not eat any food 4 hours prior to the test. . You may take your regular medications prior to the test.  . Take metoprolol (Lopressor) 100 mg two hours prior to test. . FEMALES- please wear underwire-free bra if available  After the Test: . Drink plenty of water. . After receiving IV contrast, you may experience a mild flushed feeling. This is normal. . On occasion, you may experience a mild rash up to 24 hours after the test. This is not dangerous. If this occurs, you can take Benadryl  25 mg and increase your fluid intake. . If you experience trouble breathing, this can be serious. If it is severe call 911 IMMEDIATELY. If it is mild, please call our office.  Once we have confirmed authorization from your insurance company, we will call you to set up a date and time for your test. Based on how quickly your insurance processes prior authorizations requests, please allow up to 4 weeks to be contacted for  scheduling your Cardiac CT appointment. Be advised that routine Cardiac CT appointments could be scheduled as many as 8 weeks after your provider has ordered it.  For non-scheduling related questions, please contact the cardiac imaging nurse navigator should you have any questions/concerns: Marchia Bond, Cardiac Imaging Nurse Navigator [place] Saver, Interim Cardiac Imaging Nurse [place] [facility] V[facility] Services Direct Office Dial: [phone]   For scheduling needs, including cancellations and rescheduling, please call [place], [phone].

## 2020-11-21 ENCOUNTER — Ambulatory Visit (INDEPENDENT_AMBULATORY_CARE_PROVIDER_SITE_OTHER): Payer: BC Managed Care – PPO | Admitting: Internal Medicine

## 2020-11-21 ENCOUNTER — Other Ambulatory Visit: Payer: Self-pay

## 2020-11-21 ENCOUNTER — Encounter: Payer: Self-pay | Admitting: Internal Medicine

## 2020-11-21 VITALS — BP 144/82 | HR 85 | Temp 98.9°F | Resp 18 | Ht 66.0 in | Wt 284.0 lb

## 2020-11-21 DIAGNOSIS — R1011 Right upper quadrant pain: Secondary | ICD-10-CM | POA: Diagnosis not present

## 2020-11-21 DIAGNOSIS — R140 Abdominal distension (gaseous): Secondary | ICD-10-CM

## 2020-11-21 DIAGNOSIS — K5900 Constipation, unspecified: Secondary | ICD-10-CM | POA: Diagnosis not present

## 2020-11-21 MED ORDER — DOCUSATE SODIUM 100 MG PO CAPS: 100.0000 mg | ORAL_CAPSULE | Freq: Two times a day (BID) | ORAL | 1 refills | Status: DC

## 2020-11-21 MED ORDER — SIMETHICONE 80 MG PO TABS: 1.0000 | ORAL_TABLET | Freq: Two times a day (BID) | ORAL | 1 refills | Status: DC | PRN

## 2020-11-21 NOTE — Patient Instructions (Signed)
Please start taking Colace 100 mg up to twice daily for constipation.  Please take Simethicone as needed for bloating sensation.  Please contact us if symptoms are persistent. We will schedule you for CT scan of the abdomen if symptoms are persistent.

## 2020-11-21 NOTE — Progress Notes (Signed)
Established Patient Office Visit  Subjective:  Patient ID: [name], female    DOB: 08/21/1961  Age: 59 y.o. MRN: [id number]  CC:  Chief Complaint  Patient presents with  . Abdominal Pain        HPI [name] is a 59 year old female with PMH of HTN, goiter and morbid obesity who presents with c/o RUQ and RLQ pain. Patient states that her pain is intermittent, dull, about 5/10, nonradiating and better with Pepto Bismol. She had RUQ [place] in 05/2020, which was negative for gallstones. She states that she also has gas-like sensation and feels better after passing stool. She has regular BM every morning. She denies melena, hematochezia, nausea, vomiting or hematemesis. She has occasional hard stools, for which she tries to drink more water. Patient takes Metamucil PRN for constipation. She denies any fever, chills, dysuria or hematuria.  Past Medical History:  Diagnosis Date  . Hypertension   . Joint pain   . Left knee pain   . Pain of right clavicle 10/17/2018  . Seasonal allergies     Past Surgical History:  Procedure Laterality Date  . COLONOSCOPY N/A 09/15/2014   Procedure: COLONOSCOPY;  Surgeon: Rogene Houston, MD;  Location: [facility];  Service: Endoscopy;  Laterality: N/A;  830  . Cyst removed from buttock  1980's    Family History  Problem Relation Age of Onset  . Diabetes Mother   . High blood pressure Mother   . Colon cancer Neg Hx     Social History   Socioeconomic History  . Marital status: Married    Spouse name: Not on file  . Number of children: Not on file  . Years of education: Not on file  . Highest education level: Not on file  Occupational History  . Occupation: Pharmacist, hospital  Tobacco Use  . Smoking status: Never Smoker  . Smokeless tobacco: Never Used  Vaping Use  . Vaping Use: Never used  Substance and Sexual Activity  . Alcohol use: No  . Drug use: No  . Sexual activity: Yes  Other Topics Concern  . Not on file  Social History  Narrative  . Not on file   Social Determinants of Health   Financial Resource Strain: Not on file  Food Insecurity: Not on file  Transportation Needs: Not on file  Physical Activity: Not on file  Stress: Not on file  Social Connections: Not on file  Intimate Partner Violence: Not on file    Outpatient Medications Prior to Visit  Medication Sig Dispense Refill  . amLODipine (NORVASC) 10 MG tablet Take 1 tablet (10 mg total) by mouth daily. 90 tablet 3  . Biotin 10000 MCG TABS Take 1 tablet by mouth daily.    . cetirizine (ZYRTEC) 10 MG tablet Take 10 mg by mouth at bedtime.    . Cholecalciferol (VITAMIN D3) 10 MCG (400 UNIT) CAPS Take 1 capsule by mouth daily.    . clotrimazole-betamethasone (LOTRISONE) cream Apply 1 application topically 2 (two) times daily. 45 g 1  . cyanocobalamin 100 MCG tablet Take 100 mcg by mouth daily.    . fluticasone (FLONASE) 50 MCG/ACT nasal spray Place 2 sprays into both nostrils daily. 16 g 3  . meclizine (ANTIVERT) 25 MG tablet Take 1 tablet (25 mg total) by mouth 3 (three) times daily as needed for dizziness. 30 tablet 0  . metoprolol tartrate (LOPRESSOR) 100 MG tablet Take one tablet 2 hours prior to CT scan. 1 tablet  0  . Multiple Vitamin (MULTIVITAMIN) tablet Take 1 tablet by mouth daily.    Marland Kitchen omega-3 acid ethyl esters (LOVAZA) 1 g capsule Take by mouth 2 (two) times daily.    . vitamin C (ASCORBIC ACID) 500 MG tablet Take 500 mg by mouth daily.    Marland Kitchen zinc gluconate 50 MG tablet Take 50 mg by mouth daily.    Marland Kitchen azithromycin (ZITHROMAX) 250 MG tablet Take two tablets by mouth on day one, then take one tablet once daily for an additional four days 6 tablet 0  . diazepam (VALIUM) 5 MG tablet Take 1 tablet at bedtime as needed for neck spasm 10 tablet 0  . HYDROcodone-acetaminophen (NORCO) 5-325 MG tablet Take 1-2 tablets by mouth every 6 (six) hours as needed. 12 tablet 0  . traMADol (ULTRAM) 50 MG tablet tramadol 50 mg tablet  Take 1 tablet every 6  hours by oral route as needed.     No facility-administered medications prior to visit.    Allergies  Allergen Reactions  . Sulfamethoxazole-Trimethoprim     REACTION: nausea    ROS Review of Systems  Constitutional: Negative for chills and fever.  HENT: Negative for congestion, sinus pressure, sinus pain and sore throat.   Eyes: Negative for pain and discharge.  Respiratory: Negative for cough and shortness of breath.   Cardiovascular: Negative for chest pain and palpitations.  Gastrointestinal: Positive for abdominal pain and constipation. Negative for diarrhea, nausea and vomiting.  Endocrine: Negative for polydipsia and polyuria.  Genitourinary: Negative for dysuria and hematuria.  Musculoskeletal: Negative for neck pain and neck stiffness.  Skin: Negative for rash.  Neurological: Negative for dizziness and weakness.  Psychiatric/Behavioral: Negative for agitation and behavioral problems.      Objective:    Physical Exam Vitals reviewed.  Constitutional:      General: She is not in acute distress.    Appearance: She is obese. She is not diaphoretic.  HENT:     Head: Normocephalic and atraumatic.     Nose: Nose normal. No congestion.     Mouth/Throat:     Mouth: Mucous membranes are moist.     Pharynx: No posterior oropharyngeal erythema.  Eyes:     General: No scleral icterus.    Extraocular Movements: Extraocular movements intact.     Pupils: Pupils are equal, round, and reactive to light.  Cardiovascular:     Rate and Rhythm: Normal rate and regular rhythm.     Heart sounds: No murmur heard.   Pulmonary:     Breath sounds: Normal breath sounds. No wheezing or rales.  Abdominal:     General: Bowel sounds are normal.     Palpations: Abdomen is soft.     Tenderness: There is abdominal tenderness (Mild) in the right upper quadrant. There is no right CVA tenderness, left CVA tenderness, guarding or rebound. Negative signs include McBurney's sign.   Musculoskeletal:     Cervical back: Neck supple. No tenderness.     Right lower leg: No edema.     Left lower leg: No edema.  Skin:    General: Skin is warm.     Findings: No rash.  Neurological:     General: No focal deficit present.     Mental Status: She is alert and oriented to person, place, and time.  Psychiatric:        Mood and Affect: Mood normal.        Behavior: Behavior normal.     BP Marland Kitchen)  144/82 (BP Location: Right Arm, Patient Position: Sitting, Cuff Size: Normal)   Pulse 85   Temp 98.9 F (37.2 C) (Oral)   Resp 18   Ht 5\' 6"  (1.676 m)   Wt 284 lb (128.8 kg)   LMP 10/19/2011 Comment: pt signed preg test waiver 06/23/17  SpO2 96%   BMI 45.84 kg/m  Wt Readings from Last 3 Encounters:  11/21/20 284 lb (128.8 kg)  11/08/20 286 lb 3.2 oz (129.8 kg)  10/29/20 270 lb (122.5 kg)     Health Maintenance Due  Topic Date Due  . COVID-19 Vaccine (3 - Booster for Pfizer series) 09/05/2020    There are no preventive care reminders to display for this patient.  Lab Results  Component Value Date   TSH 1.46 01/05/2020   Lab Results  Component Value Date   WBC 7.5 10/29/2020   HGB 11.2 (L) 10/29/2020   HCT 36.5 10/29/2020   MCV 96.6 10/29/2020   PLT 285 10/29/2020   Lab Results  Component Value Date   NA 141 10/29/2020   K 3.5 10/29/2020   CO2 27 10/29/2020   GLUCOSE 110 (H) 10/29/2020   BUN 17 10/29/2020   CREATININE 0.86 10/29/2020   BILITOT 0.4 05/11/2020   ALKPHOS 76 12/07/2015   AST 15 05/11/2020   ALT 12 05/11/2020   PROT 6.7 05/11/2020   ALBUMIN 3.9 12/07/2015   CALCIUM 9.2 10/29/2020   ANIONGAP 6 10/29/2020   Lab Results  Component Value Date   CHOL 186 05/11/2020   Lab Results  Component Value Date   HDL 57 05/11/2020   Lab Results  Component Value Date   LDLCALC 116 (H) 05/11/2020   Lab Results  Component Value Date   TRIG 51 05/11/2020   Lab Results  Component Value Date   CHOLHDL 3.3 05/11/2020   Lab Results  Component  Value Date   HGBA1C 5.1 01/05/2020      Assessment & Plan:   Problem List Items Addressed This Visit   None   Visit Diagnoses    Constipation, unspecified constipation type    -  Primary Likely a reason for right sided pain Advised to increase fluid intake Colace PRN, can try Senokot if persistent   Relevant Medications   docusate sodium (COLACE) 100 MG capsule   RUQ pain     Also mentions RLQ pain Previous RUQ [place] reviewed Will treat constipation for now, unlikely to be gallbladder etiology Advised to avoid oily and fried food Will obtain CT abdomen if pain persistent   Bloating     Simethicone PRN   Relevant Medications   Simethicone 80 MG TABS      Meds ordered this encounter  Medications  . docusate sodium (COLACE) 100 MG capsule    Sig: Take 1 capsule (100 mg total) by mouth 2 (two) times daily.    Dispense:  60 capsule    Refill:  1  . Simethicone 80 MG TABS    Sig: Take 1 tablet (80 mg total) by mouth 2 (two) times daily as needed.    Dispense:  30 tablet    Refill:  1    Follow-up: No follow-ups on file.    Lindell Spar, MD

## 2020-11-27 ENCOUNTER — Ambulatory Visit: Payer: BC Managed Care – PPO | Admitting: Cardiovascular Disease

## 2020-11-29 ENCOUNTER — Telehealth (HOSPITAL_COMMUNITY): Payer: Self-pay | Admitting: *Deleted

## 2020-11-29 NOTE — Telephone Encounter (Signed)
Attempted to call patient regarding upcoming cardiac CT appointment. Left message on voicemail with name and callback number  Merle Tai RN Navigator Cardiac Imaging [facility] [phone] Office [phone] Cell  

## 2020-11-29 NOTE — Telephone Encounter (Signed)
Reaching out to patient to offer assistance regarding upcoming cardiac imaging study; pt verbalizes understanding of appt date/time, parking situation and where to check in, pre-test NPO status and medications ordered, and verified current allergies; name and call back number provided for further questions should they arise ° °Kita Neace Tai RN Navigator Cardiac Imaging °[facility] °[phone] office °[phone] cell ° °

## 2020-12-04 ENCOUNTER — Ambulatory Visit (HOSPITAL_COMMUNITY)
Admission: RE | Admit: 2020-12-04 | Discharge: 2020-12-04 | Disposition: A | Discharge status: Home / Self Care | Payer: BC Managed Care – PPO | Source: Ambulatory Visit | Attending: Cardiovascular Disease | Admitting: Cardiovascular Disease

## 2020-12-04 ENCOUNTER — Other Ambulatory Visit: Payer: Self-pay

## 2020-12-04 DIAGNOSIS — I10 Essential (primary) hypertension: Secondary | ICD-10-CM | POA: Diagnosis present

## 2020-12-04 DIAGNOSIS — R079 Chest pain, unspecified: Secondary | ICD-10-CM | POA: Diagnosis not present

## 2020-12-04 DIAGNOSIS — E785 Hyperlipidemia, unspecified: Secondary | ICD-10-CM | POA: Insufficient documentation

## 2020-12-04 DIAGNOSIS — Z006 Encounter for examination for normal comparison and control in clinical research program: Secondary | ICD-10-CM

## 2020-12-04 MED ORDER — NITROGLYCERIN 0.4 MG SL SUBL
SUBLINGUAL_TABLET | SUBLINGUAL | Status: AC
  Administered 2020-12-04: 12:00:00 0.8 mg via SUBLINGUAL
  Filled 2020-12-04: qty 2

## 2020-12-04 MED ORDER — IOHEXOL 350 MG/ML SOLN
80.0000 mL | Freq: Once | INTRAVENOUS | Status: AC | PRN
  Administered 2020-12-04: 12:00:00 80 mL via INTRAVENOUS

## 2020-12-04 MED ORDER — NITROGLYCERIN 0.4 MG SL SUBL: 0.8000 mg | SUBLINGUAL_TABLET | SUBLINGUAL | Status: DC | PRN

## 2020-12-04 NOTE — Research (Signed)
IDENTIFY Informed Consent   Subject Name: [name]  Subject met inclusion and exclusion criteria.  The informed consent form, study requirements and expectations were reviewed with the subject and questions and concerns were addressed prior to the signing of the consent form.  The subject verbalized understanding of the trial requirements.  The subject agreed to participate in the IDENTIFY trial and signed the informed consent at 1045 on 27/DEC/2021.  The informed consent was obtained prior to performance of any protocol-specific procedures for the subject.  A copy of the signed informed consent was given to the subject and a copy was placed in the subject's medical record.   Sherlyn Lees

## 2020-12-14 ENCOUNTER — Encounter: Payer: Self-pay | Admitting: Family Medicine

## 2020-12-14 ENCOUNTER — Telehealth (INDEPENDENT_AMBULATORY_CARE_PROVIDER_SITE_OTHER): Payer: BC Managed Care – PPO | Admitting: Family Medicine

## 2020-12-14 ENCOUNTER — Other Ambulatory Visit: Payer: Self-pay

## 2020-12-14 VITALS — BP 134/74 | Ht 66.0 in | Wt 280.0 lb

## 2020-12-14 DIAGNOSIS — I10 Essential (primary) hypertension: Secondary | ICD-10-CM

## 2020-12-14 DIAGNOSIS — J479 Bronchiectasis, uncomplicated: Secondary | ICD-10-CM

## 2020-12-14 DIAGNOSIS — R490 Dysphonia: Secondary | ICD-10-CM

## 2020-12-14 DIAGNOSIS — E785 Hyperlipidemia, unspecified: Secondary | ICD-10-CM

## 2020-12-14 NOTE — Progress Notes (Signed)
Virtual Visit via Telephone Note  I connected with [name] on 12/14/20 at  3:00 PM EST by telephone and verified that I am speaking with the correct person using two identifiers.  Location: Patient: home Provider: office   I discussed the limitations, risks, security and privacy concerns of performing an evaluation and management service by telephone and the availability of in person appointments. I also discussed with the patient that there may be a patient responsible charge related to this service. The patient expressed understanding and agreed to proceed.   History of Present Illness: F/u recent abnormal CTA report. Recently learned from CTA that she has bronchiectasis. Was hospitalized with pneumonia over 15 years ago, and intermittently has sinusitis and bronchitis, with intermittent cough C/o chronic painless loss of voice/ hoarseness, with diminishing ability to sing Denies recent fever or chills. Denies sinus pressure, nasal congestion, ear pain or sore throat.  Denies chest pains, palpitations and leg swelling Denies abdominal pain, nausea, vomiting,diarrhea or constipation.   Denies dysuria, frequency, hesitancy or incontinence. Denies joint pain, swelling and limitation in mobility. Denies headaches, seizures, numbness, or tingling. Denies depression, anxiety or insomnia. Denies skin break down or rash.        Observations/Objective: BP 134/74   Ht 5\' 6"  (1.676 m)   Wt 280 lb (127 kg)   LMP 10/19/2011 Comment: pt signed preg test waiver 06/23/17  BMI 45.19 kg/m  Good communication with no confusion and intact memory. Alert and oriented x 3 No signs of respiratory distress during speech    Assessment and Plan: Bronchiectasis (HCC) Dx on recent CTA scan, refer Pulmonary for eval and management  HTN (hypertension) Controlled, no change in medication DASH diet and commitment to daily physical activity for a minimum of 30 minutes discussed and encouraged,  as a part of hypertension management. The importance of attaining a healthy weight is also discussed.  BP/Weight 12/14/2020 12/04/2020 11/21/2020 11/08/2020 10/29/2020 08/19/2020 06/22/2020  Systolic BP 134 132 144 130 126 128 138  Diastolic BP 74 74 82 80 77 79 80  Wt. (Lbs) 280 - 284 286.2 270 280 290  BMI 45.19 - 45.84 46.19 43.58 45.19 46.81       Hoarseness, chronic Chronic painless hoarseness, refer ENT  Morbid obesity  Patient re-educated about  the importance of commitment to a  minimum of 150 minutes of exercise per week as able.  The importance of healthy food choices with portion control discussed, as well as eating regularly and within a 12 hour window most days. The need to choose "clean , green" food 50 to 75% of the time is discussed, as well as to make water the primary drink and set a goal of 64 ounces water daily.    Weight /BMI 12/14/2020 11/21/2020 11/08/2020  WEIGHT 280 lb 284 lb 286 lb 3.2 oz  HEIGHT 5\' 6"  5\' 6"  5\' 6"   BMI 45.19 kg/m2 45.84 kg/m2 46.19 kg/m2        Follow Up Instructions:    I discussed the assessment and treatment plan with the patient. The patient was provided an opportunity to ask questions and all were answered. The patient agreed with the plan and demonstrated an understanding of the instructions.   The patient was advised to call back or seek an in-person evaluation if the symptoms worsen or if the condition fails to improve as anticipated.  I provided 20 minutes of non-face-to-face time during this encounter.   14/12/2019, MD

## 2020-12-14 NOTE — Patient Instructions (Addendum)
F/U in office with MD in 4.5 months, call if you need me before  You are being referred to Pulmonary and ENT specialists  Please get fasting lipid, cmp and EGFR, and TSH 1 week before next visit  Think about what you will eat, plan ahead. Choose " clean, green, fresh or frozen" over canned, processed or packaged foods which are more sugary, salty and fatty. 70 to 75% of food eaten should be vegetables and fruit. Three meals at set times with snacks allowed between meals, but they must be fruit or vegetables. Aim to eat over a 12 hour period , example 7 am to 7 pm, and STOP after  your last meal of the day. Drink water,generally about 64 ounces per day, no other drink is as healthy. Fruit juice is best enjoyed in a healthy way, by EATING the fruit. It is important that you exercise regularly at least 30 minutes 5 times a week. If you develop chest pain, have severe difficulty breathing, or feel very tired, stop exercising immediately and seek medical attention  Thanks for choosing [facility], we consider it a privelige to serve you.

## 2020-12-17 DIAGNOSIS — J479 Bronchiectasis, uncomplicated: Secondary | ICD-10-CM | POA: Insufficient documentation

## 2020-12-17 NOTE — Assessment & Plan Note (Signed)
  Patient re-educated about  the importance of commitment to a  minimum of 150 minutes of exercise per week as able.  The importance of healthy food choices with portion control discussed, as well as eating regularly and within a 12 hour window most days. The need to choose "clean , green" food 50 to 75% of the time is discussed, as well as to make water the primary drink and set a goal of 64 ounces water daily.    Weight /BMI 12/14/2020 11/21/2020 11/08/2020  WEIGHT 280 lb 284 lb 286 lb 3.2 oz  HEIGHT 5\' 6"  5\' 6"  5\' 6"   BMI 45.19 kg/m2 45.84 kg/m2 46.19 kg/m2

## 2020-12-17 NOTE — Assessment & Plan Note (Signed)
Chronic painless hoarseness, refer ENT

## 2020-12-17 NOTE — Assessment & Plan Note (Signed)
Dx on recent CTA scan, refer Pulmonary for eval and management

## 2020-12-17 NOTE — Assessment & Plan Note (Signed)
Controlled, no change in medication DASH diet and commitment to daily physical activity for a minimum of 30 minutes discussed and encouraged, as a part of hypertension management. The importance of attaining a healthy weight is also discussed.  BP/Weight 12/14/2020 12/04/2020 11/21/2020 11/08/2020 10/29/2020 08/19/2020 6/60/6301  Systolic BP 601 093 235 573 220 254 270  Diastolic BP 74 74 82 80 77 79 80  Wt. (Lbs) 280 - 284 286.2 270 280 290  BMI 45.19 - 45.84 46.19 43.58 45.19 46.81

## 2020-12-18 ENCOUNTER — Encounter: Payer: Self-pay | Admitting: Family Medicine

## 2021-01-01 ENCOUNTER — Other Ambulatory Visit: Payer: Self-pay

## 2021-01-01 ENCOUNTER — Encounter: Payer: Self-pay | Admitting: Internal Medicine

## 2021-01-01 ENCOUNTER — Ambulatory Visit (INDEPENDENT_AMBULATORY_CARE_PROVIDER_SITE_OTHER): Payer: Self-pay | Admitting: Internal Medicine

## 2021-01-01 VITALS — BP 136/80 | HR 94 | Temp 97.3°F | Ht 66.0 in | Wt 284.0 lb

## 2021-01-01 DIAGNOSIS — R1031 Right lower quadrant pain: Secondary | ICD-10-CM

## 2021-01-01 DIAGNOSIS — G8929 Other chronic pain: Secondary | ICD-10-CM

## 2021-01-01 DIAGNOSIS — R140 Abdominal distension (gaseous): Secondary | ICD-10-CM

## 2021-01-01 DIAGNOSIS — K5909 Other constipation: Secondary | ICD-10-CM

## 2021-01-01 MED ORDER — SENNOSIDES-DOCUSATE SODIUM 8.6-50 MG PO TABS: 1.0000 | ORAL_TABLET | Freq: Every evening | ORAL | 2 refills | Status: DC | PRN

## 2021-01-01 NOTE — Patient Instructions (Signed)
You are being scheduled to get CT abdomen done.  Please start taking Senokot instead of Docusate.  Please continue to take fluid at least 2 liters in a day to help with constipation.

## 2021-01-01 NOTE — Progress Notes (Signed)
Acute Office Visit  Subjective:    Patient ID: [name], female    DOB: Jan 02, 1961, 60 y.o.   MRN: [id number]  Chief Complaint  Patient presents with  . Follow-up    Discuss gas/bloating, still ongoing.  . Constipation    Improving some.     HPI Patient is in today for evaluation of constipation and RLQ pain. She tried taking Colace and has helped her a little. She continues to have RLQ pain, which is non-radiating. She tried Nexium for acid reflux, which has resolved now. She has been avoiding spicy food. She denies any melena or hematochezia. No episodes of diarrhea. Denies dysuria or hematuria.  Past Medical History:  Diagnosis Date  . Hypertension   . Joint pain   . Left knee pain   . Pain of right clavicle 10/17/2018  . Seasonal allergies     Past Surgical History:  Procedure Laterality Date  . COLONOSCOPY N/A 09/15/2014   Procedure: COLONOSCOPY;  Surgeon: Rogene Houston, MD;  Location: [facility];  Service: Endoscopy;  Laterality: N/A;  830  . Cyst removed from buttock  1980's    Family History  Problem Relation Age of Onset  . Diabetes Mother   . High blood pressure Mother   . Colon cancer Neg Hx     Social History   Socioeconomic History  . Marital status: Married    Spouse name: Not on file  . Number of children: Not on file  . Years of education: Not on file  . Highest education level: Not on file  Occupational History  . Occupation: Pharmacist, hospital  Tobacco Use  . Smoking status: Never Smoker  . Smokeless tobacco: Never Used  Vaping Use  . Vaping Use: Never used  Substance and Sexual Activity  . Alcohol use: No  . Drug use: No  . Sexual activity: Yes  Other Topics Concern  . Not on file  Social History Narrative  . Not on file   Social Determinants of Health   Financial Resource Strain: Not on file  Food Insecurity: Not on file  Transportation Needs: Not on file  Physical Activity: Not on file  Stress: Not on file  Social Connections:  Not on file  Intimate Partner Violence: Not on file    Outpatient Medications Prior to Visit  Medication Sig Dispense Refill  . amLODipine (NORVASC) 10 MG tablet Take 1 tablet (10 mg total) by mouth daily. 90 tablet 3  . Biotin 10000 MCG TABS Take 1 tablet by mouth daily.    . cetirizine (ZYRTEC) 10 MG tablet Take 10 mg by mouth at bedtime.    . Cholecalciferol (VITAMIN D3) 10 MCG (400 UNIT) CAPS Take 1 capsule by mouth daily.    . clotrimazole-betamethasone (LOTRISONE) cream Apply 1 application topically 2 (two) times daily. 45 g 1  . cyanocobalamin 100 MCG tablet Take 100 mcg by mouth daily.    Marland Kitchen docusate sodium (COLACE) 100 MG capsule Take 1 capsule (100 mg total) by mouth 2 (two) times daily. 60 capsule 1  . fluticasone (FLONASE) 50 MCG/ACT nasal spray Place 2 sprays into both nostrils daily. 16 g 3  . meclizine (ANTIVERT) 25 MG tablet Take 1 tablet (25 mg total) by mouth 3 (three) times daily as needed for dizziness. 30 tablet 0  . metoprolol tartrate (LOPRESSOR) 100 MG tablet Take one tablet 2 hours prior to CT scan. 1 tablet 0  . Multiple Vitamin (MULTIVITAMIN) tablet Take 1 tablet by mouth  daily.    . omega-3 acid ethyl esters (LOVAZA) 1 g capsule Take by mouth 2 (two) times daily.    . Simethicone 80 MG TABS Take 1 tablet (80 mg total) by mouth 2 (two) times daily as needed. 30 tablet 1  . vitamin C (ASCORBIC ACID) 500 MG tablet Take 500 mg by mouth daily.    Marland Kitchen zinc gluconate 50 MG tablet Take 50 mg by mouth daily.     No facility-administered medications prior to visit.    Allergies  Allergen Reactions  . Sulfamethoxazole-Trimethoprim     REACTION: nausea    Review of Systems  Constitutional: Negative for chills and fever.  Respiratory: Negative for cough and shortness of breath.   Cardiovascular: Negative for chest pain and palpitations.  Gastrointestinal: Positive for abdominal pain and constipation. Negative for blood in stool, diarrhea, nausea and vomiting.   Genitourinary: Negative for dysuria and hematuria.       Objective:    Physical Exam Constitutional:      General: She is not in acute distress.    Appearance: She is obese. She is not diaphoretic.  HENT:     Head: Normocephalic and atraumatic.     Mouth/Throat:     Mouth: Mucous membranes are moist.  Eyes:     General: No scleral icterus. Abdominal:     General: Bowel sounds are normal. There is no distension.     Palpations: Abdomen is soft.     Tenderness: There is abdominal tenderness (Mild, RLQ). There is no guarding or rebound.  Skin:    General: Skin is warm.     Findings: No rash.  Neurological:     General: No focal deficit present.     Mental Status: She is alert and oriented to person, place, and time.  Psychiatric:        Mood and Affect: Mood normal.        Behavior: Behavior normal.     BP 136/80 (BP Location: Right Arm, Patient Position: Sitting, Cuff Size: Large)   Pulse 94   Temp (!) 97.3 F (36.3 C) (Temporal)   Ht 5\' 6"  (1.676 m)   Wt 284 lb (128.8 kg)   LMP 10/19/2011 Comment: pt signed preg test waiver 06/23/17  SpO2 98%   BMI 45.84 kg/m  Wt Readings from Last 3 Encounters:  01/01/21 284 lb (128.8 kg)  12/14/20 280 lb (127 kg)  11/21/20 284 lb (128.8 kg)    There are no preventive care reminders to display for this patient.  There are no preventive care reminders to display for this patient.   Lab Results  Component Value Date   TSH 1.46 01/05/2020   Lab Results  Component Value Date   WBC 7.5 10/29/2020   HGB 11.2 (L) 10/29/2020   HCT 36.5 10/29/2020   MCV 96.6 10/29/2020   PLT 285 10/29/2020   Lab Results  Component Value Date   NA 141 10/29/2020   K 3.5 10/29/2020   CO2 27 10/29/2020   GLUCOSE 110 (H) 10/29/2020   BUN 17 10/29/2020   CREATININE 0.86 10/29/2020   BILITOT 0.4 05/11/2020   ALKPHOS 76 12/07/2015   AST 15 05/11/2020   ALT 12 05/11/2020   PROT 6.7 05/11/2020   ALBUMIN 3.9 12/07/2015   CALCIUM 9.2  10/29/2020   ANIONGAP 6 10/29/2020   Lab Results  Component Value Date   CHOL 186 05/11/2020   Lab Results  Component Value Date   HDL 57 05/11/2020  Lab Results  Component Value Date   LDLCALC 116 (H) 05/11/2020   Lab Results  Component Value Date   TRIG 51 05/11/2020   Lab Results  Component Value Date   CHOLHDL 3.3 05/11/2020   Lab Results  Component Value Date   HGBA1C 5.1 01/05/2020       Assessment & Plan:   Problem List Items Addressed This Visit   None   Visit Diagnoses    Chronic constipation    -  Primary Tried Colace, somewhat helped Senokot prescribed Increase water intake to at least 2 l in a day   Relevant Medications   senna-docusate (SENOKOT S) 8.6-50 MG tablet   Chronic RLQ pain   Most likely constipation related But since it has been ongoing, would check CT abdomen and pelvis to r/o nephtolithiasis or any mass   Relevant Orders   CT Abdomen Pelvis W Contrast   Abdominal distension (gaseous)  Simethicone      Relevant Orders   CT Abdomen Pelvis W Contrast       Meds ordered this encounter  Medications  . senna-docusate (SENOKOT S) 8.6-50 MG tablet    Sig: Take 1 tablet by mouth at bedtime as needed for mild constipation.    Dispense:  30 tablet    Refill:  2     Silvio Sausedo Keith Rake, MD

## 2021-01-25 ENCOUNTER — Ambulatory Visit (HOSPITAL_COMMUNITY): Payer: BC Managed Care – PPO

## 2021-01-25 ENCOUNTER — Encounter (HOSPITAL_COMMUNITY): Payer: Self-pay

## 2021-02-12 ENCOUNTER — Other Ambulatory Visit: Payer: Self-pay | Admitting: Family Medicine

## 2021-02-14 ENCOUNTER — Other Ambulatory Visit: Payer: Self-pay | Admitting: Family Medicine

## 2021-02-16 ENCOUNTER — Ambulatory Visit (HOSPITAL_COMMUNITY)
Admission: RE | Admit: 2021-02-16 | Discharge: 2021-02-16 | Disposition: A | Discharge status: Home / Self Care | Payer: BC Managed Care – PPO | Source: Ambulatory Visit | Attending: Internal Medicine | Admitting: Internal Medicine

## 2021-02-16 DIAGNOSIS — G8929 Other chronic pain: Secondary | ICD-10-CM | POA: Diagnosis present

## 2021-02-16 DIAGNOSIS — R1031 Right lower quadrant pain: Secondary | ICD-10-CM | POA: Diagnosis not present

## 2021-02-16 DIAGNOSIS — R140 Abdominal distension (gaseous): Secondary | ICD-10-CM | POA: Insufficient documentation

## 2021-02-16 LAB — POCT I-STAT CREATININE: Creatinine, Ser: 0.6 mg/dL (ref 0.44–1.00)

## 2021-02-16 MED ORDER — IOHEXOL 300 MG/ML  SOLN
100.0000 mL | Freq: Once | INTRAMUSCULAR | Status: AC | PRN
  Administered 2021-02-16: 100 mL via INTRAVENOUS

## 2021-02-28 ENCOUNTER — Institutional Professional Consult (permissible substitution): Payer: BC Managed Care – PPO | Admitting: Internal Medicine

## 2021-02-28 NOTE — Progress Notes (Deleted)
   [name], female    DOB: [birth year], 60 y.o.   MRN: [id number]   Brief patient profile:      History of Present Illness  02/28/2021  Pulmonary/ 1st office eval/ [facility] re bronchiectasis  (Dr Joya Gaskins eval 2003)  No chief complaint on file.    Dyspnea:  *** Cough: *** Sleep: *** SABA use:   Past Medical History:  Diagnosis Date  . Hypertension   . Joint pain   . Left knee pain   . Pain of right clavicle 10/17/2018  . Seasonal allergies     Outpatient Medications Prior to Visit  Medication Sig Dispense Refill  . amLODipine (NORVASC) 10 MG tablet Take 1 tablet by mouth once daily 90 tablet 0  . Biotin 10000 MCG TABS Take 1 tablet by mouth daily.    . cetirizine (ZYRTEC) 10 MG tablet Take 10 mg by mouth at bedtime.    . Cholecalciferol (VITAMIN D3) 10 MCG (400 UNIT) CAPS Take 1 capsule by mouth daily.    . clotrimazole-betamethasone (LOTRISONE) cream Apply 1 application topically 2 (two) times daily. 45 g 1  . cyanocobalamin 100 MCG tablet Take 100 mcg by mouth daily.    Marland Kitchen docusate sodium (COLACE) 100 MG capsule Take 1 capsule (100 mg total) by mouth 2 (two) times daily. 60 capsule 1  . fluticasone (FLONASE) 50 MCG/ACT nasal spray Place 2 sprays into both nostrils daily. 16 g 3  . meclizine (ANTIVERT) 25 MG tablet Take 1 tablet (25 mg total) by mouth 3 (three) times daily as needed for dizziness. 30 tablet 0  . metoprolol tartrate (LOPRESSOR) 100 MG tablet Take one tablet 2 hours prior to CT scan. 1 tablet 0  . Multiple Vitamin (MULTIVITAMIN) tablet Take 1 tablet by mouth daily.    Marland Kitchen omega-3 acid ethyl esters (LOVAZA) 1 g capsule Take by mouth 2 (two) times daily.    Marland Kitchen senna-docusate (SENOKOT S) 8.6-50 MG tablet Take 1 tablet by mouth at bedtime as needed for mild constipation. 30 tablet 2  . Simethicone 80 MG TABS Take 1 tablet (80 mg total) by mouth 2 (two) times daily as needed. 30 tablet 1  . vitamin C (ASCORBIC ACID) 500 MG tablet Take 500 mg by mouth  daily.    Marland Kitchen zinc gluconate 50 MG tablet Take 50 mg by mouth daily.     No facility-administered medications prior to visit.     Objective:     LMP 10/19/2011       I personally reviewed images and agree with radiology impression as follows:   Chest CT 12/04/20 coronary study  Scattered areas of very mild cylindrical bronchiectasis are noted in the lungs bilaterally.    Assessment   No problem-specific Assessment & Plan notes found for this encounter.     Christinia Gully, MD 02/28/2021

## 2021-03-12 ENCOUNTER — Encounter: Payer: Self-pay | Admitting: Internal Medicine

## 2021-03-12 ENCOUNTER — Ambulatory Visit (INDEPENDENT_AMBULATORY_CARE_PROVIDER_SITE_OTHER): Payer: BC Managed Care – PPO | Admitting: Internal Medicine

## 2021-03-12 ENCOUNTER — Other Ambulatory Visit: Payer: Self-pay

## 2021-03-12 VITALS — BP 151/86 | HR 88 | Resp 18 | Ht 66.0 in | Wt 284.1 lb

## 2021-03-12 DIAGNOSIS — H6121 Impacted cerumen, right ear: Secondary | ICD-10-CM

## 2021-03-12 DIAGNOSIS — J0110 Acute frontal sinusitis, unspecified: Secondary | ICD-10-CM | POA: Diagnosis not present

## 2021-03-12 MED ORDER — CARBAMIDE PEROXIDE 6.5 % OT SOLN: 5.0000 [drp] | Freq: Two times a day (BID) | OTIC | 0 refills | Status: DC

## 2021-03-12 MED ORDER — AMOXICILLIN-POT CLAVULANATE 875-125 MG PO TABS
1.0000 | ORAL_TABLET | Freq: Two times a day (BID) | ORAL | 0 refills | Status: DC
Start: 2021-03-12 — End: 2021-05-04

## 2021-03-12 NOTE — Progress Notes (Signed)
Acute Office Visit  Subjective:    Patient ID: [name], female    DOB: 08-09-1961, 60 y.o.   MRN: [id number]  Chief Complaint  Patient presents with  . Sinus Problem    Pt has been having right temporal pain sometimes to ear and over eye for about a week did have runny nose and congestion but that has stopped and the pain remains    HPI Patient is in today for evaluation of right sided frontal and temporal headache with right sided ear fullness. She had runny nose and nasal congestion few days ago, which have improved now. She currently denies fever, chills, sore throat, nausea, vomiting. She denies any vision problem currently. Denies any problems with chewing.  Past Medical History:  Diagnosis Date  . Hypertension   . Joint pain   . Left knee pain   . Pain of right clavicle 10/17/2018  . Seasonal allergies     Past Surgical History:  Procedure Laterality Date  . COLONOSCOPY N/A 09/15/2014   Procedure: COLONOSCOPY;  Surgeon: Rogene Houston, MD;  Location: [facility];  Service: Endoscopy;  Laterality: N/A;  830  . Cyst removed from buttock  1980's    Family History  Problem Relation Age of Onset  . Diabetes Mother   . High blood pressure Mother   . Colon cancer Neg Hx     Social History   Socioeconomic History  . Marital status: Married    Spouse name: Not on file  . Number of children: Not on file  . Years of education: Not on file  . Highest education level: Not on file  Occupational History  . Occupation: Pharmacist, hospital  Tobacco Use  . Smoking status: Never Smoker  . Smokeless tobacco: Never Used  Vaping Use  . Vaping Use: Never used  Substance and Sexual Activity  . Alcohol use: No  . Drug use: No  . Sexual activity: Yes  Other Topics Concern  . Not on file  Social History Narrative  . Not on file   Social Determinants of Health   Financial Resource Strain: Not on file  Food Insecurity: Not on file  Transportation Needs: Not on file  Physical  Activity: Not on file  Stress: Not on file  Social Connections: Not on file  Intimate Partner Violence: Not on file    Outpatient Medications Prior to Visit  Medication Sig Dispense Refill  . amLODipine (NORVASC) 10 MG tablet Take 1 tablet by mouth once daily 90 tablet 0  . Biotin 10000 MCG TABS Take 1 tablet by mouth daily.    . cetirizine (ZYRTEC) 10 MG tablet Take 10 mg by mouth at bedtime.    . clotrimazole-betamethasone (LOTRISONE) cream Apply 1 application topically 2 (two) times daily. 45 g 1  . cyanocobalamin 100 MCG tablet Take 100 mcg by mouth daily.    . Multiple Vitamin (MULTIVITAMIN) tablet Take 1 tablet by mouth daily.    Marland Kitchen omega-3 acid ethyl esters (LOVAZA) 1 g capsule Take by mouth 2 (two) times daily.    . vitamin C (ASCORBIC ACID) 500 MG tablet Take 500 mg by mouth daily.    Marland Kitchen zinc gluconate 50 MG tablet Take 50 mg by mouth daily.    . Cholecalciferol (VITAMIN D3) 10 MCG (400 UNIT) CAPS Take 1 capsule by mouth daily. (Patient not taking: Reported on 03/12/2021)    . docusate sodium (COLACE) 100 MG capsule Take 1 capsule (100 mg total) by mouth 2 (two)  times daily. (Patient not taking: Reported on 03/12/2021) 60 capsule 1  . fluticasone (FLONASE) 50 MCG/ACT nasal spray Place 2 sprays into both nostrils daily. (Patient not taking: Reported on 03/12/2021) 16 g 3  . meclizine (ANTIVERT) 25 MG tablet Take 1 tablet (25 mg total) by mouth 3 (three) times daily as needed for dizziness. (Patient not taking: Reported on 03/12/2021) 30 tablet 0  . metoprolol tartrate (LOPRESSOR) 100 MG tablet Take one tablet 2 hours prior to CT scan. (Patient not taking: Reported on 03/12/2021) 1 tablet 0  . senna-docusate (SENOKOT S) 8.6-50 MG tablet Take 1 tablet by mouth at bedtime as needed for mild constipation. (Patient not taking: Reported on 03/12/2021) 30 tablet 2  . Simethicone 80 MG TABS Take 1 tablet (80 mg total) by mouth 2 (two) times daily as needed. (Patient not taking: Reported on 03/12/2021) 30  tablet 1   No facility-administered medications prior to visit.    Allergies  Allergen Reactions  . Sulfamethoxazole-Trimethoprim     REACTION: nausea    Review of Systems  Constitutional: Negative for chills and fever.  HENT: Positive for congestion, sinus pressure and sinus pain. Negative for sore throat.   Eyes: Negative for discharge and visual disturbance.  Respiratory: Negative for cough and shortness of breath.   Cardiovascular: Negative for chest pain and palpitations.  Gastrointestinal: Positive for constipation. Negative for diarrhea, nausea and vomiting.       Objective:    Physical Exam Vitals reviewed.  Constitutional:      General: She is not in acute distress.    Appearance: She is obese. She is not diaphoretic.  HENT:     Head: Normocephalic and atraumatic.     Right Ear: Ear canal normal. There is impacted cerumen.     Left Ear: Tympanic membrane and ear canal normal.     Nose: No congestion.     Mouth/Throat:     Mouth: Mucous membranes are moist.     Pharynx: No posterior oropharyngeal erythema.  Eyes:     General: No scleral icterus.    Extraocular Movements: Extraocular movements intact.  Cardiovascular:     Rate and Rhythm: Normal rate and regular rhythm.     Pulses: Normal pulses.     Heart sounds: Normal heart sounds.  Pulmonary:     Breath sounds: Normal breath sounds. No wheezing or rales.  Musculoskeletal:     Cervical back: Neck supple. No tenderness.  Skin:    General: Skin is warm.     Findings: No rash.  Neurological:     Mental Status: She is alert.     BP (!) 151/86 (BP Location: Right Arm, Patient Position: Sitting, Cuff Size: Normal)   Pulse 88   Resp 18   Ht 5\' 6"  (1.676 m)   Wt 284 lb 1.9 oz (128.9 kg)   LMP 10/19/2011   SpO2 97%   BMI 45.86 kg/m  Wt Readings from Last 3 Encounters:  03/12/21 284 lb 1.9 oz (128.9 kg)  01/01/21 284 lb (128.8 kg)  12/14/20 280 lb (127 kg)    There are no preventive care reminders  to display for this patient.  There are no preventive care reminders to display for this patient.   Lab Results  Component Value Date   TSH 1.46 01/05/2020   Lab Results  Component Value Date   WBC 7.5 10/29/2020   HGB 11.2 (L) 10/29/2020   HCT 36.5 10/29/2020   MCV 96.6 10/29/2020   PLT  285 10/29/2020   Lab Results  Component Value Date   NA 141 10/29/2020   K 3.5 10/29/2020   CO2 27 10/29/2020   GLUCOSE 110 (H) 10/29/2020   BUN 17 10/29/2020   CREATININE 0.60 02/16/2021   BILITOT 0.4 05/11/2020   ALKPHOS 76 12/07/2015   AST 15 05/11/2020   ALT 12 05/11/2020   PROT 6.7 05/11/2020   ALBUMIN 3.9 12/07/2015   CALCIUM 9.2 10/29/2020   ANIONGAP 6 10/29/2020   Lab Results  Component Value Date   CHOL 186 05/11/2020   Lab Results  Component Value Date   HDL 57 05/11/2020   Lab Results  Component Value Date   LDLCALC 116 (H) 05/11/2020   Lab Results  Component Value Date   TRIG 51 05/11/2020   Lab Results  Component Value Date   CHOLHDL 3.3 05/11/2020   Lab Results  Component Value Date   HGBA1C 5.1 01/05/2020       Assessment & Plan:   Problem List Items Addressed This Visit   None   Visit Diagnoses    Acute frontal sinusitis, recurrence not specified    -  Primary Headache likely due to frontal sinusitis Started Augmentin Use humidifier at nighttime If persistent pain, will check for ESR to r/o GCA    Relevant Medications   amoxicillin-clavulanate (AUGMENTIN) 875-125 MG tablet   Impacted cerumen of right ear     Debrox ear drops   Relevant Medications   carbamide peroxide (DEBROX) 6.5 % OTIC solution       Meds ordered this encounter  Medications  . amoxicillin-clavulanate (AUGMENTIN) 875-125 MG tablet    Sig: Take 1 tablet by mouth 2 (two) times daily.    Dispense:  20 tablet    Refill:  0  . carbamide peroxide (DEBROX) 6.5 % OTIC solution    Sig: Place 5 drops into the right ear 2 (two) times daily.    Dispense:  15 mL    Refill:   0     Kamauri Kathol Keith Rake, MD

## 2021-03-12 NOTE — Patient Instructions (Signed)

## 2021-04-02 ENCOUNTER — Ambulatory Visit: Payer: Self-pay | Admitting: Internal Medicine

## 2021-04-23 ENCOUNTER — Telehealth: Payer: Self-pay

## 2021-04-23 DIAGNOSIS — Z006 Encounter for examination for normal comparison and control in clinical research program: Secondary | ICD-10-CM

## 2021-04-23 NOTE — Telephone Encounter (Signed)
I called patient for her 90-day Identify Study follow up phone call. Patient is doing well with no cardiac symptoms at this time. I reminded patient I would call her in Dec. for her 1 year follow-up. 

## 2021-04-26 ENCOUNTER — Ambulatory Visit: Payer: Self-pay | Admitting: Family Medicine

## 2021-05-02 ENCOUNTER — Other Ambulatory Visit: Payer: Self-pay

## 2021-05-02 ENCOUNTER — Ambulatory Visit: Payer: BC Managed Care – PPO | Admitting: Family Medicine

## 2021-05-02 ENCOUNTER — Encounter: Payer: Self-pay | Admitting: Family Medicine

## 2021-05-02 VITALS — BP 158/92 | HR 80 | Temp 97.0°F | Ht 66.0 in | Wt 297.0 lb

## 2021-05-02 DIAGNOSIS — J3089 Other allergic rhinitis: Secondary | ICD-10-CM | POA: Diagnosis not present

## 2021-05-02 DIAGNOSIS — Z1211 Encounter for screening for malignant neoplasm of colon: Secondary | ICD-10-CM

## 2021-05-02 DIAGNOSIS — D126 Benign neoplasm of colon, unspecified: Secondary | ICD-10-CM

## 2021-05-02 DIAGNOSIS — B369 Superficial mycosis, unspecified: Secondary | ICD-10-CM

## 2021-05-02 DIAGNOSIS — I10 Essential (primary) hypertension: Secondary | ICD-10-CM | POA: Diagnosis not present

## 2021-05-02 DIAGNOSIS — E785 Hyperlipidemia, unspecified: Secondary | ICD-10-CM

## 2021-05-02 MED ORDER — BENZONATATE 100 MG PO CAPS: 100.0000 mg | ORAL_CAPSULE | Freq: Two times a day (BID) | ORAL | 0 refills | Status: DC | PRN

## 2021-05-02 MED ORDER — PREDNISONE 10 MG PO TABS: 10.0000 mg | ORAL_TABLET | Freq: Two times a day (BID) | ORAL | 0 refills | Status: DC

## 2021-05-02 MED ORDER — FLUTICASONE PROPIONATE 50 MCG/ACT NA SUSP: 2.0000 | Freq: Every day | NASAL | 2 refills | Status: DC

## 2021-05-02 MED ORDER — MONTELUKAST SODIUM 10 MG PO TABS: 10.0000 mg | ORAL_TABLET | Freq: Every day | ORAL | 3 refills | Status: DC

## 2021-05-02 MED ORDER — CLOTRIMAZOLE-BETAMETHASONE 1-0.05 % EX CREA: 1.0000 "application " | TOPICAL_CREAM | Freq: Two times a day (BID) | CUTANEOUS | 1 refills | Status: DC

## 2021-05-02 MED ORDER — SPIRONOLACTONE 50 MG PO TABS: 50.0000 mg | ORAL_TABLET | Freq: Every day | ORAL | 2 refills | Status: DC

## 2021-05-02 MED ORDER — METHYLPREDNISOLONE ACETATE 80 MG/ML IJ SUSP
80.0000 mg | Freq: Once | INTRAMUSCULAR | Status: AC
  Administered 2021-05-02: 80 mg via INTRAMUSCULAR

## 2021-05-02 NOTE — Assessment & Plan Note (Addendum)
4 day flare right buttock, topical med prescribed, no purulent drainage

## 2021-05-02 NOTE — Assessment & Plan Note (Signed)
Uncontrolled , add spironolactone  DASH diet and commitment to daily physical activity for a minimum of 30 minutes discussed and encouraged, as a part of hypertension management. The importance of attaining a healthy weight is also discussed.  BP/Weight 05/02/2021 03/12/2021 01/01/2021 12/14/2020 12/04/2020 11/21/2020 45/07/5928  Systolic BP 244 628 638 177 116 579 038  Diastolic BP 92 86 80 74 74 82 80  Wt. (Lbs) 297 284.12 284 280 - 284 286.2  BMI 47.94 45.86 45.84 45.19 - 45.84 46.19

## 2021-05-02 NOTE — Assessment & Plan Note (Signed)
Refer for colonoscopy 

## 2021-05-02 NOTE — Patient Instructions (Signed)
F/U IN 4 TO 5 WEEKS, CALL IF YOU NEED ME SOONER  DEPO MEDROL 80 MG Im IN OFFICE TODAY FOR UNCONTROLLED ALLERGY SYMPTOMS AND COMMIT TO DAILY FLONASE AND SINGULAIR  SHORT COURSE OF PREDNISONE AND TESSALON PERLES ARE PRESCRIBED   BLOOD PRESSURE IS UNCONTROLLED NEW ADDITIONAL MEDICATION  SPIRONOLACTONE 50 MG ONCE DAIL, CONTINE AMLODIPINE AS BEFORE  YOU ARE REFERRED FOR COLONOSCOPY  It is important that you exercise regularly at least 30 minutes 5 times a week. If you develop chest pain, have severe difficulty breathing, or feel very tired, stop exercising immediately and seek medical attention   Thanks for choosing [facility], we consider it a privelige to serve you.

## 2021-05-02 NOTE — Assessment & Plan Note (Addendum)
  Patient re-educated about  the importance of commitment to a  minimum of 150 minutes of exercise per week as able. Will be getting bariatric surgery  The importance of healthy food choices with portion control discussed, as well as eating regularly and within a 12 hour window most days. The need to choose "clean , green" food 50 to 75% of the time is discussed, as well as to make water the primary drink and set a goal of 64 ounces water daily.    Weight /BMI 05/02/2021 03/12/2021 01/01/2021  WEIGHT 297 lb 284 lb 1.9 oz 284 lb  HEIGHT 5\' 6"  5\' 6"  5\' 6"   BMI 47.94 kg/m2 45.86 kg/m2 45.84 kg/m2    Has upcomng surgery

## 2021-05-02 NOTE — Assessment & Plan Note (Signed)
Uncontrolled , depo medro 80 mg iM and predniosne dose pack and tessaLONF PERLES

## 2021-05-03 ENCOUNTER — Other Ambulatory Visit (HOSPITAL_COMMUNITY): Payer: Self-pay | Admitting: General Surgery

## 2021-05-03 ENCOUNTER — Encounter (INDEPENDENT_AMBULATORY_CARE_PROVIDER_SITE_OTHER): Payer: Self-pay | Admitting: *Deleted

## 2021-05-03 ENCOUNTER — Other Ambulatory Visit: Payer: Self-pay | Admitting: General Surgery

## 2021-05-04 ENCOUNTER — Encounter: Payer: Self-pay | Admitting: Family Medicine

## 2021-05-04 NOTE — Assessment & Plan Note (Signed)
Hyperlipidemia:Low fat diet discussed and encouraged.   Lipid Panel  Lab Results  Component Value Date   CHOL 186 05/11/2020   HDL 57 05/11/2020   LDLCALC 116 (H) 05/11/2020   TRIG 51 05/11/2020   CHOLHDL 3.3 05/11/2020   Needs to reduce fried and fatty foods

## 2021-05-04 NOTE — Progress Notes (Signed)
[name]     MRN: [id number]      DOB: January 12, 1961   HPI Ms. Nappi is here for follow up and re-evaluation of chronic medical conditions, medication management and review of any available recent lab and radiology data.  Preventive health is updated, specifically  Cancer screening and Immunization.   Questions or concerns regarding consultations or procedures which the PT has had in the interim are  addressed. The PT denies any adverse reactions to current medications since the last visit.  3 week h/o increased sinus congestion and drainage, no fevr, chills, ear pain, cough or sore throat 5 day h/o itchy rash on buttock, has had this in the past and responded well to med prescribed Hopes to have bariatric surgery in the next 2 months  ROS Denies recent fever or chills.  Denies chest congestion, productive cough or wheezing. Denies chest pains, palpitations and leg swelling Denies abdominal pain, nausea, vomiting,diarrhea or constipation.   Denies dysuria, frequency, hesitancy or incontinence. Denies joint pain, swelling and limitation in mobility. Denies headaches, seizures, numbness, or tingling. Denies depression, anxiety or insomnia. Marland Kitchen   PE  BP (!) 158/92   Pulse 80   Temp (!) 97 F (36.1 C) (Temporal)   Ht 5\' 6"  (1.676 m)   Wt 297 lb (134.7 kg)   LMP 10/19/2011   SpO2 97%   BMI 47.94 kg/m   Patient alert and oriented and in no cardiopulmonary distress.  HEENT: No facial asymmetry, EOMI,     Neck supple .TM clear, no sinus pressure, positive nasal congestion, no cervical adenopathy  Chest: Clear to auscultation bilaterally.  CVS: S1, S2 no murmurs, no S3.Regular rate.  ABD: Soft non tender.   Ext: No edema  MS: Adequate ROM spine, shoulders, hips and knees.  Skin: Hyperpigmented macular rash with excoriated skin on buttock  Psych: Good eye contact, normal affect. Memory intact not anxious or depressed appearing.  CNS: CN 2-12 intact, power,  normal  throughout.no focal deficits noted.   Assessment & Plan  Tubular adenoma of colon Refer for colonoscopy  Morbid obesity  Patient re-educated about  the importance of commitment to a  minimum of 150 minutes of exercise per week as able. Will be getting bariatric surgery  The importance of healthy food choices with portion control discussed, as well as eating regularly and within a 12 hour window most days. The need to choose "clean , green" food 50 to 75% of the time is discussed, as well as to make water the primary drink and set a goal of 64 ounces water daily.    Weight /BMI 05/02/2021 03/12/2021 01/01/2021  WEIGHT 297 lb 284 lb 1.9 oz 284 lb  HEIGHT 5\' 6"  5\' 6"  5\' 6"   BMI 47.94 kg/m2 45.86 kg/m2 45.84 kg/m2    Has upcomng surgery  Dermatomycosis 4 day flare right buttock, topical med prescribed, no purulent drainage  HTN (hypertension) Uncontrolled , add spironolactone  DASH diet and commitment to daily physical activity for a minimum of 30 minutes discussed and encouraged, as a part of hypertension management. The importance of attaining a healthy weight is also discussed.  BP/Weight 05/02/2021 03/12/2021 01/01/2021 12/14/2020 12/04/2020 11/21/2020 10/17/4173  Systolic BP 081 448 185 631 497 026 378  Diastolic BP 92 86 80 74 74 82 80  Wt. (Lbs) 297 284.12 284 280 - 284 286.2  BMI 47.94 45.86 45.84 45.19 - 45.84 46.19       Allergic rhinitis Uncontrolled , depo medro  80 mg iM and predniosne dose pack and tessaLONF PERLES  Dyslipidemia Hyperlipidemia:Low fat diet discussed and encouraged.   Lipid Panel  Lab Results  Component Value Date   CHOL 186 05/11/2020   HDL 57 05/11/2020   LDLCALC 116 (H) 05/11/2020   TRIG 51 05/11/2020   CHOLHDL 3.3 05/11/2020   Needs to reduce fried and fatty foods

## 2021-05-15 ENCOUNTER — Telehealth (INDEPENDENT_AMBULATORY_CARE_PROVIDER_SITE_OTHER): Payer: Self-pay

## 2021-05-15 ENCOUNTER — Other Ambulatory Visit (INDEPENDENT_AMBULATORY_CARE_PROVIDER_SITE_OTHER): Payer: Self-pay

## 2021-05-15 ENCOUNTER — Encounter (INDEPENDENT_AMBULATORY_CARE_PROVIDER_SITE_OTHER): Payer: Self-pay

## 2021-05-15 DIAGNOSIS — Z86010 Personal history of colonic polyps: Secondary | ICD-10-CM

## 2021-05-15 DIAGNOSIS — Z1211 Encounter for screening for malignant neoplasm of colon: Secondary | ICD-10-CM

## 2021-05-15 MED ORDER — PEG 3350-KCL-NA BICARB-NACL 420 G PO SOLR: 4000.0000 mL | ORAL | 0 refills | Status: DC

## 2021-05-15 NOTE — Telephone Encounter (Signed)
LeighAnn Kenni Newton, CMA  

## 2021-05-16 ENCOUNTER — Encounter (INDEPENDENT_AMBULATORY_CARE_PROVIDER_SITE_OTHER): Payer: Self-pay

## 2021-05-16 ENCOUNTER — Other Ambulatory Visit (INDEPENDENT_AMBULATORY_CARE_PROVIDER_SITE_OTHER): Payer: Self-pay

## 2021-05-18 ENCOUNTER — Other Ambulatory Visit (HOSPITAL_COMMUNITY): Payer: Self-pay | Admitting: General Surgery

## 2021-05-18 ENCOUNTER — Ambulatory Visit (HOSPITAL_COMMUNITY)
Admission: RE | Admit: 2021-05-18 | Discharge: 2021-05-18 | Disposition: A | Discharge status: Home / Self Care | Payer: BC Managed Care – PPO | Source: Ambulatory Visit | Attending: General Surgery | Admitting: General Surgery

## 2021-05-18 ENCOUNTER — Other Ambulatory Visit: Payer: Self-pay

## 2021-06-05 ENCOUNTER — Encounter: Payer: Self-pay | Admitting: Family Medicine

## 2021-06-05 ENCOUNTER — Ambulatory Visit: Payer: BC Managed Care – PPO | Admitting: Family Medicine

## 2021-06-05 ENCOUNTER — Other Ambulatory Visit: Payer: Self-pay

## 2021-06-05 VITALS — BP 123/81 | HR 80 | Resp 16 | Ht 66.0 in | Wt 299.0 lb

## 2021-06-05 DIAGNOSIS — E785 Hyperlipidemia, unspecified: Secondary | ICD-10-CM

## 2021-06-05 DIAGNOSIS — Z23 Encounter for immunization: Secondary | ICD-10-CM

## 2021-06-05 DIAGNOSIS — E559 Vitamin D deficiency, unspecified: Secondary | ICD-10-CM

## 2021-06-05 DIAGNOSIS — E041 Nontoxic single thyroid nodule: Secondary | ICD-10-CM

## 2021-06-05 DIAGNOSIS — I10 Essential (primary) hypertension: Secondary | ICD-10-CM

## 2021-06-05 NOTE — Patient Instructions (Addendum)
F/U in 4 months, call if you need  me before, shingrix #2 at visit  CBC, fasting lipid, cmp and eGFR, HBA1C, TSH, vit D in the morning, result will be sent to [place] Hale ( Navigator ) at [facility]  Shingrix #1  in office today  Please get covid booster in next 2 to 4 weeks  Excellent blood pressure  It is important that you exercise regularly at least 30 minutes 5 times a week. If you develop chest pain, have severe difficulty breathing, or feel very tired, stop exercising immediately and seek medical attention   Think about what you will eat, plan ahead. Choose " clean, green, fresh or frozen" over canned, processed or packaged foods which are more sugary, salty and fatty. 70 to 75% of food eaten should be vegetables and fruit. Three meals at set times with snacks allowed between meals, but they must be fruit or vegetables. Aim to eat over a 12 hour period , example 7 am to 7 pm, and STOP after  your last meal of the day. Drink water,generally about 64 ounces per day, no other drink is as healthy. Fruit juice is best enjoyed in a healthy way, by EATING the fruit. Thanks for choosing [facility], we consider it a privelige to serve you.

## 2021-06-06 ENCOUNTER — Encounter (HOSPITAL_COMMUNITY): Admission: RE | Admit: 2021-06-06 | Payer: BC Managed Care – PPO | Source: Ambulatory Visit

## 2021-06-11 ENCOUNTER — Encounter: Payer: Self-pay | Admitting: Family Medicine

## 2021-06-11 NOTE — Progress Notes (Signed)
   [name]     MRN: [id number]      DOB: November 19, 1961   HPI [name] is here for follow up and re-evaluation of chronic medical conditions, medication management and review of any available recent lab and radiology data.  Preventive health is updated, specifically  Cancer screening and Immunization.   Has bariatric surgery planned for the Summer and she is ready and excited about this. The PT denies any adverse reactions to current medications since the last visit.  There are no new concerns.  There are no specific complaints   ROS Denies recent fever or chills. Denies sinus pressure, nasal congestion, ear pain or sore throat. Denies chest congestion, productive cough or wheezing. Denies chest pains, palpitations and leg swelling Denies abdominal pain, nausea, vomiting,diarrhea or constipation.   Denies dysuria, frequency, hesitancy or incontinence. Denies uncontrolled  joint pain, swelling and limitation in mobility. Denies headaches, seizures, numbness, or tingling. Denies depression, anxiety or insomnia. Denies skin break down or rash.   PE  BP 123/81   Pulse 80   Resp 16   Ht 5\' 6"  (1.676 m)   Wt 299 lb (135.6 kg)   LMP 10/19/2011   SpO2 94%   BMI 48.26 kg/m   Patient alert and oriented and in no cardiopulmonary distress.  HEENT: No facial asymmetry, EOMI,     Neck supple .  Chest: Clear to auscultation bilaterally.  CVS: S1, S2 no murmurs, no S3.Regular rate.  ABD: Soft non tender.   Ext: No edema  MS: Adequate ROM spine, shoulders, hips and knees.  Skin: Intact, no ulcerations or rash noted.  Psych: Good eye contact, normal affect. Memory intact not anxious or depressed appearing.  CNS: CN 2-12 intact, power,  normal throughout.no focal deficits noted.   Assessment & Plan  HTN (hypertension) Controlled, no change in medication DASH diet and commitment to daily physical activity for a minimum of 30 minutes discussed and encouraged, as a part of  hypertension management. The importance of attaining a healthy weight is also discussed.  BP/Weight 06/05/2021 05/02/2021 03/12/2021 01/01/2021 12/14/2020 12/04/2020 43/60/6770  Systolic BP 340 352 481 859 093 112 162  Diastolic BP 81 92 86 80 74 74 82  Wt. (Lbs) 299 297 284.12 284 280 - 284  BMI 48.26 47.94 45.86 45.84 45.19 - 45.84       Morbid obesity  Patient re-educated about  the importance of commitment to a  minimum of 150 minutes of exercise per week as able.  The importance of healthy food choices with portion control discussed, as well as eating regularly and within a 12 hour window most days. The need to choose "clean , green" food 50 to 75% of the time is discussed, as well as to make water the primary drink and set a goal of 64 ounces water daily.    Weight /BMI 06/05/2021 05/02/2021 03/12/2021  WEIGHT 299 lb 297 lb 284 lb 1.9 oz  HEIGHT 5\' 6"  5\' 6"  5\' 6"   BMI 48.26 kg/m2 47.94 kg/m2 45.86 kg/m2      Dyslipidemia Hyperlipidemia:Low fat diet discussed and encouraged.   Lipid Panel  Lab Results  Component Value Date   CHOL 186 05/11/2020   HDL 57 05/11/2020   LDLCALC 116 (H) 05/11/2020   TRIG 51 05/11/2020   CHOLHDL 3.3 05/11/2020     Updated lab needed

## 2021-06-11 NOTE — Assessment & Plan Note (Signed)
Controlled, no change in medication DASH diet and commitment to daily physical activity for a minimum of 30 minutes discussed and encouraged, as a part of hypertension management. The importance of attaining a healthy weight is also discussed.  BP/Weight 06/05/2021 05/02/2021 03/12/2021 01/01/2021 12/14/2020 12/04/2020 40/98/1191  Systolic BP 478 295 621 308 657 846 962  Diastolic BP 81 92 86 80 74 74 82  Wt. (Lbs) 299 297 284.12 284 280 - 284  BMI 48.26 47.94 45.86 45.84 45.19 - 45.84

## 2021-06-11 NOTE — Assessment & Plan Note (Signed)
Hyperlipidemia:Low fat diet discussed and encouraged.   Lipid Panel  Lab Results  Component Value Date   CHOL 186 05/11/2020   HDL 57 05/11/2020   LDLCALC 116 (H) 05/11/2020   TRIG 51 05/11/2020   CHOLHDL 3.3 05/11/2020     Updated lab needed

## 2021-06-11 NOTE — Assessment & Plan Note (Signed)
  Patient re-educated about  the importance of commitment to a  minimum of 150 minutes of exercise per week as able.  The importance of healthy food choices with portion control discussed, as well as eating regularly and within a 12 hour window most days. The need to choose "clean , green" food 50 to 75% of the time is discussed, as well as to make water the primary drink and set a goal of 64 ounces water daily.    Weight /BMI 06/05/2021 05/02/2021 03/12/2021  WEIGHT 299 lb 297 lb 284 lb 1.9 oz  HEIGHT 5\' 6"  5\' 6"  5\' 6"   BMI 48.26 kg/m2 47.94 kg/m2 45.86 kg/m2

## 2021-06-25 NOTE — Patient Instructions (Signed)
   Your procedure is scheduled on: 06/29/2021  Report to [facility] at     8:00 AM.  Call this number if you have problems the morning of surgery: [phone]   Remember:              Follow Directions on the letter you received from Your Physician's office regarding the Bowel Prep              No Smoking the day of Procedure :   Take these medicines the morning of surgery with A SIP OF WATER: Amlodipine, zyrtec, and flonase   Do not wear jewelry, make-up or nail polish.    Do not bring valuables to the hospital.  Contacts, dentures or bridgework may not be worn into surgery.  .   Patients discharged the day of surgery will not be allowed to drive home.     Colonoscopy, Adult, Care After This sheet gives you information about how to care for yourself after your procedure. Your health care provider may also give you more specific instructions. If you have problems or questions, contact your health care provider. What can I expect after the procedure? After the procedure, it is common to have: A small amount of blood in your stool for 24 hours after the procedure. Some gas. Mild abdominal cramping or bloating.  Follow these instructions at home: General instructions  For the first 24 hours after the procedure: Do not drive or use machinery. Do not sign important documents. Do not drink alcohol. Do your regular daily activities at a slower pace than normal. Eat soft, easy-to-digest foods. Rest often. Take over-the-counter or prescription medicines only as told by your health care provider. It is up to you to get the results of your procedure. Ask your health care provider, or the department performing the procedure, when your results will be ready. Relieving cramping and bloating Try walking around when you have cramps or feel bloated. Apply heat to your abdomen as told by your health care provider. Use a heat source that your health care provider recommends, such as a moist  heat pack or a heating pad. Place a towel between your skin and the heat source. Leave the heat on for 20-30 minutes. Remove the heat if your skin turns bright red. This is especially important if you are unable to feel pain, heat, or cold. You may have a greater risk of getting burned. Eating and drinking Drink enough fluid to keep your urine clear or pale yellow. Resume your normal diet as instructed by your health care provider. Avoid heavy or fried foods that are hard to digest. Avoid drinking alcohol for as long as instructed by your health care provider. Contact a health care provider if: You have blood in your stool 2-3 days after the procedure. Get help right away if: You have more than a small spotting of blood in your stool. You pass large blood clots in your stool. Your abdomen is swollen. You have nausea or vomiting. You have a fever. You have increasing abdominal pain that is not relieved with medicine. This information is not intended to replace advice given to you by your health care provider. Make sure you discuss any questions you have with your health care provider. Document Released: 07/09/2004 Document Revised: 08/19/2016 Document Reviewed: 02/06/2016 Elsevier Interactive Patient Education  [employer].

## 2021-06-27 ENCOUNTER — Other Ambulatory Visit: Payer: Self-pay

## 2021-06-27 ENCOUNTER — Encounter (HOSPITAL_COMMUNITY): Payer: Self-pay

## 2021-06-27 ENCOUNTER — Encounter (HOSPITAL_COMMUNITY)
Admission: RE | Admit: 2021-06-27 | Discharge: 2021-06-27 | Disposition: A | Discharge status: Home / Self Care | Payer: BC Managed Care – PPO | Source: Ambulatory Visit | Attending: Gastroenterology | Admitting: Gastroenterology

## 2021-06-27 DIAGNOSIS — D122 Benign neoplasm of ascending colon: Secondary | ICD-10-CM | POA: Diagnosis not present

## 2021-06-27 DIAGNOSIS — D123 Benign neoplasm of transverse colon: Secondary | ICD-10-CM | POA: Diagnosis not present

## 2021-06-27 DIAGNOSIS — Z01812 Encounter for preprocedural laboratory examination: Secondary | ICD-10-CM | POA: Insufficient documentation

## 2021-06-27 DIAGNOSIS — Z7982 Long term (current) use of aspirin: Secondary | ICD-10-CM | POA: Diagnosis not present

## 2021-06-27 DIAGNOSIS — Z79899 Other long term (current) drug therapy: Secondary | ICD-10-CM | POA: Diagnosis not present

## 2021-06-27 DIAGNOSIS — I10 Essential (primary) hypertension: Secondary | ICD-10-CM | POA: Diagnosis not present

## 2021-06-27 DIAGNOSIS — Z86010 Personal history of colonic polyps: Secondary | ICD-10-CM

## 2021-06-27 DIAGNOSIS — Z1211 Encounter for screening for malignant neoplasm of colon: Secondary | ICD-10-CM | POA: Diagnosis not present

## 2021-06-27 DIAGNOSIS — Z881 Allergy status to other antibiotic agents status: Secondary | ICD-10-CM | POA: Diagnosis not present

## 2021-06-27 DIAGNOSIS — K648 Other hemorrhoids: Secondary | ICD-10-CM | POA: Diagnosis not present

## 2021-06-27 LAB — BASIC METABOLIC PANEL
Anion gap: 9 (ref 5–15)
BUN: 14 mg/dL (ref 6–20)
CO2: 26 mmol/L (ref 22–32)
Calcium: 9 mg/dL (ref 8.9–10.3)
Chloride: 102 mmol/L (ref 98–111)
Creatinine, Ser: 0.64 mg/dL (ref 0.44–1.00)
GFR, Estimated: >60 mL/min (ref >60)
Glucose, Bld: 86 mg/dL (ref 70–99)
Potassium: 3.6 mmol/L (ref 3.5–5.1)
Sodium: 137 mmol/L (ref 135–145)

## 2021-06-29 ENCOUNTER — Encounter (HOSPITAL_COMMUNITY)
Admission: RE | Disposition: A | Discharge status: Home / Self Care | Payer: Self-pay | Source: Home / Self Care | Attending: Gastroenterology

## 2021-06-29 ENCOUNTER — Ambulatory Visit (HOSPITAL_COMMUNITY): Payer: BC Managed Care – PPO | Admitting: Anesthesiology

## 2021-06-29 ENCOUNTER — Encounter (HOSPITAL_COMMUNITY): Payer: Self-pay | Admitting: Gastroenterology

## 2021-06-29 ENCOUNTER — Ambulatory Visit (HOSPITAL_COMMUNITY)
Admission: RE | Admit: 2021-06-29 | Discharge: 2021-06-29 | Disposition: A | Discharge status: Home / Self Care | Payer: BC Managed Care – PPO | Attending: Gastroenterology | Admitting: Gastroenterology

## 2021-06-29 ENCOUNTER — Other Ambulatory Visit: Payer: Self-pay

## 2021-06-29 DIAGNOSIS — D122 Benign neoplasm of ascending colon: Secondary | ICD-10-CM | POA: Diagnosis not present

## 2021-06-29 DIAGNOSIS — Z1211 Encounter for screening for malignant neoplasm of colon: Secondary | ICD-10-CM

## 2021-06-29 DIAGNOSIS — Z86010 Personal history of colonic polyps: Secondary | ICD-10-CM | POA: Diagnosis not present

## 2021-06-29 DIAGNOSIS — Z881 Allergy status to other antibiotic agents status: Secondary | ICD-10-CM | POA: Insufficient documentation

## 2021-06-29 DIAGNOSIS — D123 Benign neoplasm of transverse colon: Secondary | ICD-10-CM | POA: Diagnosis not present

## 2021-06-29 DIAGNOSIS — K648 Other hemorrhoids: Secondary | ICD-10-CM | POA: Insufficient documentation

## 2021-06-29 DIAGNOSIS — Z79899 Other long term (current) drug therapy: Secondary | ICD-10-CM | POA: Insufficient documentation

## 2021-06-29 DIAGNOSIS — Z7982 Long term (current) use of aspirin: Secondary | ICD-10-CM | POA: Insufficient documentation

## 2021-06-29 DIAGNOSIS — I10 Essential (primary) hypertension: Secondary | ICD-10-CM | POA: Insufficient documentation

## 2021-06-29 HISTORY — PX: BIOPSY: SHX5522

## 2021-06-29 HISTORY — PX: POLYPECTOMY: SHX5525

## 2021-06-29 HISTORY — PX: COLONOSCOPY WITH PROPOFOL: SHX5780

## 2021-06-29 LAB — HM COLONOSCOPY

## 2021-06-29 SURGERY — COLONOSCOPY WITH PROPOFOL
Anesthesia: General

## 2021-06-29 MED ORDER — PROPOFOL 10 MG/ML IV BOLUS
INTRAVENOUS | Status: DC | PRN
  Administered 2021-06-29 (×2): 100 mg via INTRAVENOUS
  Administered 2021-06-29 (×4): 50 mg via INTRAVENOUS

## 2021-06-29 MED ORDER — LACTATED RINGERS IV SOLN: INTRAVENOUS | Status: DC

## 2021-06-29 NOTE — Transfer of Care (Signed)
Immediate Anesthesia Transfer of Care Note  Patient: [name]  Procedure(s) Performed: COLONOSCOPY WITH PROPOFOL BIOPSY POLYPECTOMY  Patient Location: Short Stay  Anesthesia Type:General  Level of Consciousness: awake, alert  and oriented  Airway & Oxygen Therapy: Patient Spontanous Breathing  Post-op Assessment: Report given to RN and Post -op Vital signs reviewed and stable  Post vital signs: Reviewed and stable  Last Vitals:  Vitals Value Taken Time  BP    Temp    Pulse 77 06/29/21  1036  Resp    SpO2 97 06/29/21  1036    Last Pain:  Vitals:   06/29/21 0853  TempSrc: Oral  PainSc: 0-No pain      Patients Stated Pain Goal: 8 ([id number])  Complications: No notable events documented.

## 2021-06-29 NOTE — Discharge Instructions (Signed)
You are being discharged to home.  Resume your previous diet.  We are waiting for your pathology results.  Your physician has recommended a repeat colonoscopy for surveillance based on pathology results.  

## 2021-06-29 NOTE — Op Note (Signed)
[facility] Patient Name: [name] Procedure Date: 06/29/2021 9:54 AM MRN: [id number] Date of Birth: August 23, 1961 Attending MD: Maylon Peppers ,  CSN: [id number] Age: 60 Admit Type: Outpatient Procedure:                Colonoscopy Indications:              High risk colon cancer surveillance: Personal                            history of colonic polyps Providers:                Maylon Peppers, Janeece Riggers, RN, Kristine L.                            Risa Grill, Technician Referring MD:              Medicines:                Monitored Anesthesia Care Complications:            No immediate complications. Estimated Blood Loss:     Estimated blood loss: none. Procedure:                Pre-Anesthesia Assessment:                           - Prior to the procedure, a History and Physical                            was performed, and patient medications, allergies                            and sensitivities were reviewed. The patient's                            tolerance of previous anesthesia was reviewed.                           - The risks and benefits of the procedure and the                            sedation options and risks were discussed with the                            patient. All questions were answered and informed                            consent was obtained.                           - ASA Grade Assessment: II - A patient with mild                            systemic disease.                           After obtaining informed consent, the colonoscope  was passed under direct vision. Throughout the                            procedure, the patient's blood pressure, pulse, and                            oxygen saturations were monitored continuously. The                            [id number]-[id number] scope was introduced through                            the anus and advanced to the the cecum, identified                             by appendiceal orifice and ileocecal valve. The                            colonoscopy was performed without difficulty. The                            patient tolerated the procedure well. The quality                            of the bowel preparation was adequate. Scope In: 10:07:52 AM Scope Out: 10:32:30 AM Scope Withdrawal Time: 0 hours 18 minutes 3 seconds  Total Procedure Duration: 0 hours 24 minutes 38 seconds  Findings:      The perianal and digital rectal examinations were normal.      A 2 mm polyp was found in the ascending colon. The polyp was sessile.       The polyp was removed with a cold biopsy forceps. Resection and       retrieval were complete.      A 4 mm polyp was found in the transverse colon. The polyp was sessile.       The polyp was removed with a cold snare. Resection and retrieval were       complete.      Non-bleeding internal hemorrhoids were found during retroflexion. The       hemorrhoids were small. Impression:               - One 2 mm polyp in the ascending colon, removed                            with a cold biopsy forceps. Resected and retrieved.                           - One 4 mm polyp in the transverse colon, removed                            with a cold snare. Resected and retrieved.                           - Non-bleeding internal hemorrhoids. Moderate Sedation:      Per Anesthesia  Care Recommendation:           - Discharge patient to home (ambulatory).                           - Resume previous diet.                           - Await pathology results.                           - Repeat colonoscopy for surveillance based on                            pathology results. Procedure Code(s):        --- Professional ---                           [phone], Colonoscopy, flexible; with removal of                            tumor(s), polyp(s), or other lesion(s) by snare                            technique                           [id number], 44,  Colonoscopy, flexible; with biopsy,                            single or multiple Diagnosis Code(s):        --- Professional ---                           Z86.010, Personal history of colonic polyps                           K63.5, Polyp of colon                           K64.8, Other hemorrhoids CPT copyright 2019 American Medical Association. All rights reserved. The codes documented in this report are preliminary and upon coder review may  be revised to meet current compliance requirements. Maylon Peppers, MD Maylon Peppers,  06/29/2021 10:39:35 AM This report has been signed electronically. Number of Addenda: 0

## 2021-06-29 NOTE — Anesthesia Preprocedure Evaluation (Signed)
Anesthesia Evaluation  Patient identified by MRN, date of birth, ID band Patient awake    Reviewed: Allergy & Precautions, NPO status , Patient's Chart, lab work & pertinent test results  History of Anesthesia Complications Negative for: history of anesthetic complications  Airway Mallampati: II  TM Distance: >3 FB Neck ROM: Full    Dental  (+) Dental Advisory Given, Teeth Intact   Pulmonary neg pulmonary ROS,    Pulmonary exam normal breath sounds clear to auscultation       Cardiovascular Exercise Tolerance: Good hypertension, Pt. on medications Normal cardiovascular exam Rhythm:Regular Rate:Normal     Neuro/Psych  Neuromuscular disease    GI/Hepatic negative GI ROS, Neg liver ROS,   Endo/Other  Morbid obesity  Renal/GU negative Renal ROS     Musculoskeletal  (+) Arthritis , Osteoarthritis,    Abdominal   Peds  Hematology negative hematology ROS (+)   Anesthesia Other Findings   Reproductive/Obstetrics negative OB ROS                           Anesthesia Physical Anesthesia Plan  ASA: 3  Anesthesia Plan: General   Post-op Pain Management:    Induction: Intravenous  PONV Risk Score and Plan: Propofol infusion  Airway Management Planned: Nasal Cannula and Natural Airway  Additional Equipment:   Intra-op Plan:   Post-operative Plan:   Informed Consent: I have reviewed the patients History and Physical, chart, labs and discussed the procedure including the risks, benefits and alternatives for the proposed anesthesia with the patient or authorized representative who has indicated his/her understanding and acceptance.     Dental advisory given  Plan Discussed with: CRNA and Surgeon  Anesthesia Plan Comments:         Anesthesia Quick Evaluation

## 2021-06-29 NOTE — Anesthesia Postprocedure Evaluation (Signed)
Anesthesia Post Note  Patient: [name]  Procedure(s) Performed: COLONOSCOPY WITH PROPOFOL BIOPSY POLYPECTOMY  Patient location during evaluation: Phase II Anesthesia Type: General Level of consciousness: awake and alert and oriented Pain management: pain level controlled Vital Signs Assessment: post-procedure vital signs reviewed and stable Respiratory status: spontaneous breathing and respiratory function stable Cardiovascular status: blood pressure returned to baseline and stable Postop Assessment: no apparent nausea or vomiting Anesthetic complications: no   No notable events documented.   Last Vitals:  Vitals:   06/29/21 0853 06/29/21 1035  BP: 113/89 (!) 97/56  Pulse:  73  Resp: 17 20  Temp: 36.6 C 36.5 C  SpO2: 100% 100%    Last Pain:  Vitals:   06/29/21 1035  TempSrc: Oral  PainSc: 0-No pain                 Jenelle Drennon C Tiarna Koppen

## 2021-06-29 NOTE — H&P (Signed)
[name] is an 60 y.o. female.   Chief Complaint: History of colon polyps, colorectal cancer screening prior to bariatric surgery HPI: 59 year old female with past medical history hypertension, who comes to the hospital for colorectal cancer screening prior to bariatric surgery. Patient reports her last colonoscopy was at age 55, was found to have a small polyp.  The patient denies having any complaints such as melena, hematochezia, abdominal pain or distention, change in her bowel movement consistency or frequency, no changes in her weight recently.  No family history of colorectal cancer.   Past Medical History:  Diagnosis Date   Hypertension    Joint pain    Left knee pain    Pain of right clavicle 10/17/2018   Seasonal allergies     Past Surgical History:  Procedure Laterality Date   CESAREAN SECTION     COLONOSCOPY N/A 09/15/2014   Procedure: COLONOSCOPY;  Surgeon: Rogene Houston, MD;  Location: [facility];  Service: Endoscopy;  Laterality: N/A;  830   Cyst removed from buttock  08/10/1979    Family History  Problem Relation Age of Onset   Diabetes Mother    High blood pressure Mother    Colon cancer Neg Hx    Social History:  reports that she has never smoked. She has never used smokeless tobacco. She reports that she does not drink alcohol and does not use drugs.  Allergies:  Allergies  Allergen Reactions   Sulfamethoxazole-Trimethoprim Nausea Only    Medications Prior to Admission  Medication Sig Dispense Refill   acetaminophen (TYLENOL) 325 MG tablet Take 650 mg by mouth every 6 (six) hours as needed for moderate pain.     amLODipine (NORVASC) 10 MG tablet Take 1 tablet by mouth once daily (Patient taking differently: Take 10 mg by mouth daily.) 90 tablet 0   aspirin EC 81 MG tablet Take 81 mg by mouth at bedtime. Swallow whole.     Biotin 10000 MCG TABS Take 10,000 mcg by mouth daily.     cetirizine (ZYRTEC) 10 MG tablet Take 10 mg by mouth at bedtime.      Cholecalciferol (VITAMIN D3) 10 MCG (400 UNIT) CAPS Take 400 Units by mouth daily.     clotrimazole-betamethasone (LOTRISONE) cream Apply 1 application topically 2 (two) times daily. (Patient taking differently: Apply 1 application topically 2 (two) times daily as needed (rash).) 45 g 1   cyanocobalamin 100 MCG tablet Take 100 mcg by mouth daily.     fluticasone (FLONASE) 50 MCG/ACT nasal spray Place 2 sprays into both nostrils daily. 16 g 2   MAGNESIUM PO Take 1 tablet by mouth daily.     Multiple Vitamin (MULTIVITAMIN) tablet Take 1 tablet by mouth daily.     omega-3 acid ethyl esters (LOVAZA) 1 g capsule Take 1 g by mouth daily.     vitamin C (ASCORBIC ACID) 500 MG tablet Take 500 mg by mouth daily.     zinc gluconate 50 MG tablet Take 50 mg by mouth daily.     polyethylene glycol-electrolytes (TRILYTE) 420 g solution Take 4,000 mLs by mouth as directed. 4000 mL 0    Results for orders placed or performed during the hospital encounter of 06/27/21 (from the past 48 hour(s))  Basic metabolic panel     Status: None   Collection Time: 06/27/21  3:17 PM  Result Value Ref Range   Sodium 137 135 - 145 mmol/L   Potassium 3.6 3.5 - 5.1 mmol/L  Chloride 102 98 - 111 mmol/L   CO2 26 22 - 32 mmol/L   Glucose, Bld 86 70 - 99 mg/dL    Comment: Glucose reference range applies only to samples taken after fasting for at least 8 hours.   BUN 14 6 - 20 mg/dL   Creatinine, Ser 0.64 0.44 - 1.00 mg/dL   Calcium 9.0 8.9 - 10.3 mg/dL   GFR, Estimated >60 >60 mL/min    Comment: (NOTE) Calculated using the CKD-EPI Creatinine Equation (2021)    Anion gap 9 5 - 15    Comment: Performed at [facility], [address], [place], [place] [address]   No results found.  Review of Systems  Constitutional: Negative.   HENT: Negative.    Eyes: Negative.   Respiratory: Negative.    Cardiovascular: Negative.   Gastrointestinal: Negative.   Endocrine: Negative.   Genitourinary: Negative.   Musculoskeletal:  Negative.   Skin: Negative.   Allergic/Immunologic: Negative.   Neurological: Negative.   Hematological: Negative.   Psychiatric/Behavioral: Negative.     Blood pressure 113/89, temperature 97.8 F (36.6 C), temperature source Oral, resp. rate 17, height '5\' 6"'$  (1.676 m), weight 127 kg, last menstrual period 10/19/2011, SpO2 100 %. Physical Exam  GENERAL: The patient is AO x3, in no acute distress. Obese. HEENT: Head is normocephalic and atraumatic. EOMI are intact. Mouth is well hydrated and without lesions. NECK: Supple. No masses LUNGS: Clear to auscultation. No presence of rhonchi/wheezing/rales. Adequate chest expansion HEART: RRR, normal s1 and s2. ABDOMEN: Soft, nontender, no guarding, no peritoneal signs, and nondistended. BS +. No masses. EXTREMITIES: Without any cyanosis, clubbing, rash, lesions or edema. NEUROLOGIC: AOx3, no focal motor deficit. SKIN: no jaundice, no rashes  Assessment/Plan 60 year old female with past medical history hypertension, who comes to the hospital for colorectal cancer screening prior to bariatric surgery.  Patient has a history of colon polyps -tubular adenoma.  We will proceed with colonoscopy.  Harvel Quale, MD 06/29/2021, 10:02 AM

## 2021-07-02 ENCOUNTER — Encounter (INDEPENDENT_AMBULATORY_CARE_PROVIDER_SITE_OTHER): Payer: Self-pay | Admitting: *Deleted

## 2021-07-02 LAB — SURGICAL PATHOLOGY

## 2021-07-04 ENCOUNTER — Encounter (HOSPITAL_COMMUNITY): Payer: Self-pay | Admitting: Gastroenterology

## 2021-07-17 ENCOUNTER — Other Ambulatory Visit: Payer: Self-pay

## 2021-07-17 ENCOUNTER — Encounter: Payer: Self-pay | Admitting: Skilled Nursing Facility1

## 2021-07-17 ENCOUNTER — Encounter: Payer: BC Managed Care – PPO | Attending: General Surgery | Admitting: Skilled Nursing Facility1

## 2021-07-17 NOTE — Progress Notes (Signed)
Nutrition Assessment for Bariatric Surgery Medical Nutrition Therapy Appt Start Time: 10:10    End Time: 11:05  Patient was seen on 07/17/2021 for Pre-Operative Nutrition Assessment. Letter of approval faxed to [facility] bariatric surgery program coordinator on 07/17/2021.   Referral stated Supervised Weight Loss (SWL) visits needed: 0  Pt completed visits.   Pt has cleared nutrition requirements.    Planned surgery: sleeve gastrectomy Pt expectation of surgery: to lose weight; to get a knee replacement  Pt expectation of dietitian: none stated    NUTRITION ASSESSMENT   Anthropometrics  Start weight at [facility]: 299.5 lbs (date: 07/17/2021)  Height: 66 in BMI: 48.34 kg/m2     Clinical  Medical hx: HTN Medications: multivitamin, vitamin D, aspirin, biotin, Zyrtec, magnesium, amlodipine, fluticasone  Labs: none updated in EMR Notable signs/symptoms: some knee pain Any previous deficiencies? No  Micronutrient Nutrition Focused Physical Exam: Hair: No issues observed Eyes: No issues observed Mouth: No issues observed Neck: No issues observed Nails: No issues observed Skin: No issues observed  Lifestyle & Dietary Hx  Pt states she volunteers with her church and has a few hobbies to rely on.   Pt state she has recently cut back on soda consumption.  Pt states she recognizes she goes give to others and not very much to herself.   After some conversation it may be she wants soda to relieve some indigestion so eating strategies were offered to alleviate that discomfort.  24-Hr Dietary Recall First Meal: keto coffee + 2 boiled eggs + heavy whipping cream or protein shake Snack: nuts + cheese Second Meal: celery + deli meat + nuts or frozen meal or salad Snack: protein shake Third Meal: baked chicken + green beans + pasta Snack: fruit Beverages: keto coffee, water, sweet tea   Estimated Energy Needs Calories: 1600   NUTRITION DIAGNOSIS  Overweight/obesity  ([place]-3.3) related to past poor dietary habits and physical inactivity as evidenced by patient w/ planned sleeve gastrectomy surgery following dietary guidelines for continued weight loss.    NUTRITION INTERVENTION  Nutrition counseling (C-1) and education (E-2) to facilitate bariatric surgery goals.  Educated pt on micronutrient deficiencies post surgery and strategies to mitigate that risk   Pre-Op Goals Reviewed with the Patient Track food and beverage intake (pen and paper, MyFitness Pal, Baritastic app, etc.) Make healthy food choices while monitoring portion sizes Consume 3 meals per day or try to eat every 3-5 hours Avoid concentrated sugars and fried foods Keep sugar & fat in the single digits per serving on food labels Practice CHEWING your food (aim for applesauce consistency) Practice not drinking 15 minutes before, during, and 30 minutes after each meal and snack Avoid all carbonated beverages (ex: soda, sparkling beverages)  Limit caffeinated beverages (ex: coffee, tea, energy drinks) Avoid all sugar-sweetened beverages (ex: regular soda, sports drinks)  Avoid alcohol  Aim for 64-100 ounces of FLUID daily (with at least half of fluid intake being plain water)  Aim for at least 60-80 grams of PROTEIN daily Look for a liquid protein source that contains ?15 g protein and ?5 g carbohydrate (ex: shakes, drinks, shots) Make a list of non-food related activities Physical activity is an important part of a healthy lifestyle so keep it moving! The goal is to reach 150 minutes of exercise per week, including cardiovascular and weight baring activity.  *Goals that are bolded indicate the pt would like to start working towards these  Handouts Provided Include  Bariatric Surgery handouts (Nutrition Visits,  Pre-Op Goals, Protein Shakes, Vitamins & Minerals)  Learning Style & Readiness for Change Teaching method utilized: Visual & Auditory  Demonstrated degree of understanding via:  Teach Back  Readiness Level: contemplative Barriers to learning/adherence to lifestyle change: none identified    MONITORING & EVALUATION Dietary intake, weekly physical activity, body weight, and pre-op goals reached at next nutrition visit.    Next Steps  Patient is to follow up at [facility] for Pre-Op Class >2 weeks before surgery for further nutrition education.  Pt has completed visits. No further supervised visits required/recomended

## 2021-08-25 ENCOUNTER — Other Ambulatory Visit: Payer: Self-pay | Admitting: Family Medicine

## 2021-09-12 ENCOUNTER — Ambulatory Visit: Payer: BC Managed Care – PPO | Admitting: Family Medicine

## 2021-09-17 ENCOUNTER — Encounter: Payer: BC Managed Care – PPO | Attending: General Surgery | Admitting: Skilled Nursing Facility1

## 2021-09-17 ENCOUNTER — Other Ambulatory Visit: Payer: Self-pay

## 2021-09-17 NOTE — Progress Notes (Signed)
Pre-Operative Nutrition Class:    Patient was seen on 09/17/2021 for Pre-Operative Bariatric Surgery Education [facility]    Surgery date:  Surgery type: sleeve Start weight at [facility]: 299.5 Weight today: 302.7 pounds   The following the learning objectives were met by the patient during this course: Identify Pre-Op Dietary Goals and will begin 2 weeks pre-operatively Identify appropriate sources of fluids and proteins  State protein recommendations and appropriate sources pre and post-operatively Identify Post-Operative Dietary Goals and will follow for 2 weeks post-operatively Identify appropriate multivitamin and calcium sources Describe the need for physical activity post-operatively and will follow MD recommendations State when to call healthcare provider regarding medication questions or post-operative complications When having a diagnosis of diabetes understanding hypoglycemia symptoms and the inclusion of 1 complex carbohydrate per meal  Handouts given during class include: Pre-Op Bariatric Surgery Diet Handout Protein Shake Handout Post-Op Bariatric Surgery Nutrition Handout [facility] Program Information Flyer Support Group Information Flyer [facility] Bariatric Supplements Price List  Follow-Up Plan: Patient will follow-up at [facility] 2 weeks post operatively for diet advancement per MD.

## 2021-09-24 ENCOUNTER — Ambulatory Visit (INDEPENDENT_AMBULATORY_CARE_PROVIDER_SITE_OTHER): Payer: BC Managed Care – PPO | Admitting: Internal Medicine

## 2021-09-24 ENCOUNTER — Other Ambulatory Visit: Payer: Self-pay

## 2021-09-24 ENCOUNTER — Encounter: Payer: Self-pay | Admitting: Internal Medicine

## 2021-09-24 DIAGNOSIS — J069 Acute upper respiratory infection, unspecified: Secondary | ICD-10-CM

## 2021-09-24 LAB — POCT INFLUENZA A/B
Influenza A, POC: NEGATIVE
Influenza B, POC: NEGATIVE

## 2021-09-24 LAB — POCT RAPID STREP A (OFFICE): Rapid Strep A Screen: NEGATIVE

## 2021-09-24 MED ORDER — AZITHROMYCIN 250 MG PO TABS
ORAL_TABLET | ORAL | 0 refills | Status: AC
Start: 2021-09-24 — End: 2021-09-29

## 2021-09-24 NOTE — Progress Notes (Addendum)
Virtual Visit via Telephone Note   This visit type was conducted due to national recommendations for restrictions regarding the COVID-19 Pandemic (e.g. social distancing) in an effort to limit this patient's exposure and mitigate transmission in our community.  Due to her co-morbid illnesses, this patient is at least at moderate risk for complications without adequate follow up.  This format is felt to be most appropriate for this patient at this time.  The patient did not have access to video technology/had technical difficulties with video requiring transitioning to audio format only (telephone).  All issues noted in this document were discussed and addressed.  No physical exam could be performed with this format.  Evaluation Performed:  Follow-up visit  Date:  09/24/2021   ID:  [name], DOB 05-07-1961, MRN [id number]  Patient Location: Home Provider Location: Office/Clinic  Participants: Patient Location of Patient: Home Location of Provider: Telehealth Consent was obtain for visit to be over via telehealth. I verified that I am speaking with the correct person using two identifiers.  PCP:  Fayrene Helper, MD   Chief Complaint:  Sore throat  History of Present Illness:    [name] is a 60 y.o. female who has a televisit for c/o sore throat for last 4 days. She denies any nasal congestion or postnasal drip.  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  She tested negative for COVID at home.  She denies any dyspnea or wheezing currently.  The patient does not have symptoms concerning for COVID-19 infection (fever, chills, cough, or new shortness of breath).   Past Medical, Surgical, Social History, Allergies, and Medications have been Reviewed.  Past Medical History:  Diagnosis Date   Hypertension    Joint pain    Left knee pain    Pain of right clavicle 10/17/2018   Seasonal allergies    Past Surgical History:  Procedure Laterality Date   BIOPSY   06/29/2021   Procedure: BIOPSY;  Surgeon: Harvel Quale, MD;  [facility];  Service: Gastroenterology;;   CESAREAN SECTION     COLONOSCOPY N/A 09/15/2014   Procedure: COLONOSCOPY;  Surgeon: Rogene Houston, MD;  [facility];  Service: Endoscopy;  Laterality: N/A;  830   COLONOSCOPY WITH PROPOFOL N/A 06/29/2021   Procedure: COLONOSCOPY WITH PROPOFOL;  Surgeon: Harvel Quale, MD;  [facility];  Service: Gastroenterology;  Laterality: N/A;  9:35   Cyst removed from buttock  08/10/1979   POLYPECTOMY  06/29/2021   Procedure: POLYPECTOMY;  Surgeon: Montez Morita, Quillian Quince, MD;  [facility]: [facility];  Service: Gastroenterology;;     Current Meds  Medication Sig   acetaminophen (TYLENOL) 325 MG tablet Take 650 mg by mouth every 6 (six) hours as needed for moderate pain.   amLODipine (NORVASC) 10 MG tablet Take 1 tablet by mouth once daily   aspirin EC 81 MG tablet Take 81 mg by mouth at bedtime. Swallow whole.   Biotin 10000 MCG TABS Take 10,000 mcg by mouth daily.   cetirizine (ZYRTEC) 10 MG tablet Take 10 mg by mouth at bedtime.   Cholecalciferol (VITAMIN D3) 10 MCG (400 UNIT) CAPS Take 400 Units by mouth daily.   clotrimazole-betamethasone (LOTRISONE) cream Apply 1 application topically 2 (two) times daily. (Patient taking differently: Apply 1 application topically 2 (two) times daily as needed (rash).)   cyanocobalamin 100 MCG tablet Take 100 mcg by mouth daily.   fluticasone (FLONASE) 50 MCG/ACT  nasal spray Place 2 sprays into both nostrils daily.   MAGNESIUM PO Take 1 tablet by mouth daily.   Multiple Vitamin (MULTIVITAMIN) tablet Take 1 tablet by mouth daily.   omega-3 acid ethyl esters (LOVAZA) 1 g capsule Take 1 g by mouth daily.   vitamin C (ASCORBIC ACID) 500 MG tablet Take 500 mg by mouth daily.   zinc gluconate 50 MG tablet Take 50 mg by mouth daily.     Allergies:   Sulfamethoxazole-trimethoprim   ROS:   Please see  the history of present illness.     All other systems reviewed and are negative.   Labs/Other Tests and Data Reviewed:    Recent Labs: 10/29/2020: B Natriuretic Peptide 41.0; Hemoglobin 11.2; Platelets 285 06/27/2021: BUN 14; Creatinine, Ser 0.64; Potassium 3.6; Sodium 137   Recent Lipid Panel Lab Results  Component Value Date/Time   CHOL 186 05/11/2020 08:01 AM   TRIG 51 05/11/2020 08:01 AM   HDL 57 05/11/2020 08:01 AM   CHOLHDL 3.3 05/11/2020 08:01 AM   LDLCALC 116 (H) 05/11/2020 08:01 AM    Wt Readings from Last 3 Encounters:  09/17/21 (!) 302 lb 11.2 oz (137.3 kg)  07/17/21 299 lb 8 oz (135.9 kg)  06/29/21 280 lb (127 kg)     ASSESSMENT & PLAN:    URTI Started Azithromycin as persistent symptoms with symptomatic treatment Negative rapid flu test Salt water gargling for sore throat Tylenol as needed for fever/myalgias  Time:   Today, I have spent 9 minutes reviewing the chart, including problem list, medications, and with the patient with telehealth technology discussing the above problems.   Medication Adjustments/Labs and Tests Ordered: Current medicines are reviewed at length with the patient today.  Concerns regarding medicines are outlined above.   Tests Ordered: No orders of the defined types were placed in this encounter.   Medication Changes: No orders of the defined types were placed in this encounter.    Note: This dictation was prepared with Dragon dictation along with smaller phrase technology. Similar sounding words can be transcribed inadequately or may not be corrected upon review. Any transcriptional errors that result from this process are unintentional.      Disposition:  Follow up  Signed, Lindell Spar, MD  09/24/2021 11:35 AM     [facility]

## 2021-09-26 ENCOUNTER — Encounter: Payer: Self-pay | Admitting: Family Medicine

## 2021-09-26 ENCOUNTER — Telehealth: Payer: BC Managed Care – PPO | Admitting: Physician Assistant

## 2021-09-26 DIAGNOSIS — J038 Acute tonsillitis due to other specified organisms: Secondary | ICD-10-CM | POA: Diagnosis not present

## 2021-09-26 DIAGNOSIS — B9689 Other specified bacterial agents as the cause of diseases classified elsewhere: Secondary | ICD-10-CM

## 2021-09-26 MED ORDER — AMOXICILLIN 500 MG PO CAPS: 500.0000 mg | ORAL_CAPSULE | Freq: Two times a day (BID) | ORAL | 0 refills | Status: AC

## 2021-09-26 NOTE — Progress Notes (Signed)
I have spent 5 minutes in review of e-visit questionnaire, review and updating patient chart, medical decision making and response to patient.   Kady Toothaker Cody Nandan Willems, PA-C    

## 2021-09-26 NOTE — Progress Notes (Signed)
E-Visit for Sore Throat - Strep Symptoms  We are sorry that you are not feeling well.  Here is how we plan to help!  Based on what you have shared with me it is likely that you have strep pharyngitis.  Strep pharyngitis is inflammation and infection in the back of the throat.  This is an infection cause by bacteria and is treated with antibiotics. Giving lack of other symptoms except for the sore throat and fever and negative flu swab there is concern for bacterial pharyngitis/strep, especially since rapid strep tests can be inaccurate but no culture was sent. Stop the Azithromycin. I have prescribed Amoxicillin 500 mg twice a day for 10 days. For throat pain, we recommend over the counter oral pain relief medications such as acetaminophen or aspirin, or anti-inflammatory medications such as ibuprofen or naproxen sodium. Topical treatments such as oral throat lozenges or sprays may be used as needed. Strep infections are not as easily transmitted as other respiratory infections, however we still recommend that you avoid close contact with loved ones, especially the very young and elderly.  Remember to wash your hands thoroughly throughout the day as this is the number one way to prevent the spread of infection and wipe down door knobs and counters with disinfectant.   Home Care: Only take medications as instructed by your medical team. Complete the entire course of an antibiotic. Do not take these medications with alcohol. A steam or ultrasonic humidifier can help congestion.  You can place a towel over your head and breathe in the steam from hot water coming from a faucet. Avoid close contacts especially the very young and the elderly. Cover your mouth when you cough or sneeze. Always remember to wash your hands.  Get Help Right Away If: You develop worsening fever or sinus pain. You develop a severe head ache or visual changes. Your symptoms persist after you have completed your treatment  plan.  Make sure you Understand these instructions. Will watch your condition. Will get help right away if you are not doing well or get worse.   Thank you for choosing an e-visit.  Your e-visit answers were reviewed by a board certified advanced clinical practitioner to complete your personal care plan. Depending upon the condition, your plan could have included both over the counter or prescription medications.  Please review your pharmacy choice. Make sure the pharmacy is open so you can pick up prescription now. If there is a problem, you may contact your provider through [employer] and have the prescription routed to another pharmacy.  Your safety is important to [place]. If you have drug allergies check your prescription carefully.   For the next 24 hours you can use MyChart to ask questions about today's visit, request a non-urgent call back, or ask for a work or school excuse. You will get an email in the next two days asking about your experience. I hope that your e-visit has been valuable and will speed your recovery.

## 2021-09-26 NOTE — Progress Notes (Signed)
Requested orders with Dr. Dois Davenport office.

## 2021-09-27 ENCOUNTER — Ambulatory Visit: Payer: Self-pay | Admitting: General Surgery

## 2021-09-27 DIAGNOSIS — Z01818 Encounter for other preprocedural examination: Secondary | ICD-10-CM

## 2021-10-04 NOTE — Progress Notes (Signed)
DUE TO COVID-19 ONLY ONE VISITOR IS ALLOWED TO COME WITH YOU AND STAY IN THE WAITING ROOM ONLY DURING PRE OP AND PROCEDURE DAY OF SURGERY.  2 VISITOR  MAY VISIT WITH YOU AFTER SURGERY IN YOUR PRIVATE ROOM DURING VISITING HOURS ONLY!  YOU NEED TO HAVE A COVID 19 TEST ON______AME@_  @_from  8am-3pm _____, THIS TEST MUST BE DONE BEFORE SURGERY,  Covid test is done at [place], [place] [address].  This is a drive thru.  No appt required. Please see map.                 Your procedure is scheduled on:    10/09/2021  Report to [facility] Main  Entrance   Report to admitting at    1015AM     Call this number if you have problems the morning of surgery [phone]    REMEMBER: NO  SOLID FOOD CANDY OR GUM AFTER MIDNIGHT. CLEAR LIQUIDS UNTIL  1015 am        . NOTHING BY MOUTH EXCEPT CLEAR LIQUIDS UNTIL  1015am   . PLEASE FINISH ENSURE DRINK PER SURGEON ORDER  WHICH NEEDS TO BE COMPLETED AT   1015am    .      CLEAR LIQUID DIET   Foods Allowed                                                                    Coffee and tea, regular and decaf                            Fruit ices (not with fruit pulp)                                      Iced Popsicles                                    Carbonated beverages, regular and diet                                    Cranberry, grape and apple juices Sports drinks like Gatorade Lightly seasoned clear broth or consume(fat free) Sugar, honey syrup ___________________________________________________________________      BRUSH YOUR TEETH MORNING OF SURGERY AND RINSE YOUR MOUTH OUT, NO CHEWING GUM CANDY OR MINTS.     Take these medicines the morning of surgery with A SIP OF WATER:  amlodipine, flonase, magnesium   DO NOT TAKE ANY DIABETIC MEDICATIONS DAY OF YOUR SURGERY                               You may not have any metal on your body including hair pins and              piercings  Do not wear jewelry, make-up,  lotions, powders or perfumes, deodorant             Do  not wear nail polish on your fingernails.  Do not shave  48 hours prior to surgery.              Men may shave face and neck.   Do not bring valuables to the hospital. [place].  Contacts, dentures or bridgework may not be worn into surgery.  Leave suitcase in the car. After surgery it may be brought to your room.     Patients discharged the day of surgery will not be allowed to drive home. IF YOU ARE HAVING SURGERY AND GOING HOME THE SAME DAY, YOU MUST HAVE AN ADULT TO DRIVE YOU HOME AND BE WITH YOU FOR 24 HOURS. YOU MAY GO HOME BY TAXI OR UBER OR ORTHERWISE, BUT AN ADULT MUST ACCOMPANY YOU HOME AND STAY WITH YOU FOR 24 HOURS.  Name and phone number of your driver:  Special Instructions: N/A              Please read over the following fact sheets you were given: _____________________________________________________________________  [facility] - Preparing for Surgery Before surgery, you can play an important role.  Because skin is not sterile, your skin needs to be as free of germs as possible.  You can reduce the number of germs on your skin by washing with CHG (chlorahexidine gluconate) soap before surgery.  CHG is an antiseptic cleaner which kills germs and bonds with the skin to continue killing germs even after washing. Please DO NOT use if you have an allergy to CHG or antibacterial soaps.  If your skin becomes reddened/irritated stop using the CHG and inform your nurse when you arrive at Short Stay. Do not shave (including legs and underarms) for at least 48 hours prior to the first CHG shower.  You may shave your face/neck. Please follow these instructions carefully:  1.  Shower with CHG Soap the night before surgery and the  morning of Surgery.  2.  If you choose to wash your hair, wash your hair first as usual with your  normal  shampoo.  3.  After you shampoo, rinse your hair  and body thoroughly to remove the  shampoo.                           4.  Use CHG as you would any other liquid soap.  You can apply chg directly  to the skin and wash                       Gently with a scrungie or clean washcloth.  5.  Apply the CHG Soap to your body ONLY FROM THE NECK DOWN.   Do not use on face/ open                           Wound or open sores. Avoid contact with eyes, ears mouth and genitals (private parts).                       Wash face,  Genitals (private parts) with your normal soap.             6.  Wash thoroughly, paying special attention to the area where your surgery  will be performed.  7.  Thoroughly rinse your body with warm  water from the neck down.  8.  DO NOT shower/wash with your normal soap after using and rinsing off  the CHG Soap.                9.  Pat yourself dry with a clean towel.            10.  Wear clean pajamas.            11.  Place clean sheets on your bed the night of your first shower and do not  sleep with pets. Day of Surgery : Do not apply any lotions/deodorants the morning of surgery.  Please wear clean clothes to the hospital/surgery center.  FAILURE TO FOLLOW THESE INSTRUCTIONS MAY RESULT IN THE CANCELLATION OF YOUR SURGERY PATIENT SIGNATURE_________________________________  NURSE SIGNATURE__________________________________  ________________________________________________________________________

## 2021-10-04 NOTE — Patient Instructions (Signed)
DUE TO COVID-19 ONLY ONE VISITOR IS ALLOWED TO COME WITH YOU AND STAY IN THE WAITING ROOM ONLY DURING PRE OP AND PROCEDURE.   **NO VISITORS ARE ALLOWED IN THE SHORT STAY AREA OR RECOVERY ROOM!!**  IF YOU WILL BE ADMITTED INTO THE HOSPITAL YOU ARE ALLOWED ONLY TWO SUPPORT PEOPLE DURING VISITATION HOURS ONLY (7 AM -8PM)   The support person(s) must pass our screening, gel in and out, and wear a mask at all times, including in the patient's room. Patients must also wear a mask when staff or their support person are in the room. Visitors GUEST BADGE MUST BE WORN VISIBLY  One adult visitor may remain with you overnight and MUST be in the room by 8 P.M.  No visitors under the age of 42. Any visitor under the age of 24 must be accompanied by an adult.    COVID SWAB TESTING MUST BE COMPLETED ON:  10/05/21                      8A - 3P **MUST PRESENT COMPLETED FORM AT TESTING SITE**    [address] [place] (backside of the building) You are not required to quarantine, however you are required to wear a well-fitted mask when you are out and around people not in your household.  Hand Hygiene often Do NOT share personal items Notify your provider if you are in close contact with someone who has COVID or you develop fever 100.4 or greater, new onset of sneezing, cough, sore throat, shortness of breath or body aches.  [place], [address], must go inside of the hospital, NOT A DRIVE THRU!  (Must self quarantine after testing. Follow instructions on handout.)       Your procedure is scheduled on: 10/09/21   Report to [facility] Main Entrance    Report to admitting at: 11:00 AM   Call this number if you have problems the morning of surgery [phone]   Do not eat food :After Midnight.   May have liquids until : 10:00 AM   day of surgery  MORNING OF SURGERY DRINK:   DRINK 1 G2 drink BEFORE YOU LEAVE HOME ( at  10:00 am), DRINK ALL OF THE  G2 DRINK AT ONE TIME.   NO SOLID FOOD AFTER 600 PM THE NIGHT BEFORE YOUR SURGERY. YOU MAY DRINK CLEAR FLUIDS. THE G2 DRINK YOU DRINK BEFORE YOU LEAVE HOME WILL BE THE LAST FLUIDS YOU DRINK BEFORE SURGERY.  PAIN IS EXPECTED AFTER SURGERY AND WILL NOT BE COMPLETELY ELIMINATED. AMBULATION AND TYLENOL WILL HELP REDUCE INCISIONAL AND GAS PAIN. MOVEMENT IS KEY!  YOU ARE EXPECTED TO BE OUT OF BED WITHIN 4 HOURS OF ADMISSION TO YOUR PATIENT ROOM.  SITTING IN THE RECLINER THROUGHOUT THE DAY IS IMPORTANT FOR DRINKING FLUIDS AND MOVING GAS THROUGHOUT THE GI TRACT.  COMPRESSION STOCKINGS SHOULD BE WORN [place] UNLESS YOU ARE WALKING.   INCENTIVE SPIROMETER SHOULD BE USED EVERY HOUR WHILE AWAKE TO DECREASE POST-OPERATIVE COMPLICATIONS SUCH AS PNEUMONIA.  WHEN DISCHARGED HOME, IT IS IMPORTANT TO CONTINUE TO WALK EVERY HOUR AND USE THE INCENTIVE SPIROMETER EVERY HOUR.    CLEAR LIQUID DIET  Foods Allowed  Foods Excluded  Water, Black Coffee and tea, regular and decaf                             liquids that you cannot  Plain Jell-O in any flavor  (No red)                                           see through such as: Fruit ices (not with fruit pulp)                                     milk, soups, orange juice              Iced Popsicles (No red)                                    All solid food                                   Apple juices Sports drinks like Gatorade (No red) Lightly seasoned clear broth or consume(fat free) Sugar  Sample Menu Breakfast                                Lunch                                     Supper Cranberry juice                    Beef broth                            Chicken broth Jell-O                                     Grape juice                           Apple juice Coffee or tea                        Jell-O                                       Popsicle                                                Coffee or tea                        Coffee or tea      Complete one G2 drink the morning of surgery at: 10:00 AM  the day of surgery.     The day of surgery:  Drink ONE (1) Pre-Surgery Clear Ensure or G2 by am the morning of surgery. Drink in one sitting. Do not sip.  This drink was given to you during your hospital  pre-op appointment visit. Nothing else to drink after completing the  Pre-Surgery Clear Ensure or G2.          If you have questions, please contact your surgeon's office.     Oral Hygiene is also important to reduce your risk of infection.                                    Remember - BRUSH YOUR TEETH THE MORNING OF SURGERY WITH YOUR REGULAR TOOTHPASTE   Do NOT smoke after Midnight   Take these medicines the morning of surgery with A SIP OF WATER:   DO NOT TAKE ANY ORAL DIABETIC MEDICATIONS DAY OF YOUR SURGERY                              You may not have any metal on your body including hair pins, jewelry, and body piercing             Do not wear make-up, lotions, powders, perfumes/cologne, or deodorant  Do not wear nail polish including gel and S&S, artificial/acrylic nails, or any other type of covering on natural nails including finger and toenails. If you have artificial nails, gel coating, etc. that needs to be removed by a nail salon please have this removed prior to surgery or surgery may need to be canceled/ delayed if the surgeon/ anesthesia feels like they are unable to be safely monitored.   Do not shave  48 hours prior to surgery.               Men may shave face and neck.   Do not bring valuables to the hospital. [place].   Contacts, dentures or bridgework may not be worn into surgery.   Bring small overnight bag day of surgery.    Patients discharged on the day of surgery will not be allowed to drive home.   Special  Instructions: Bring a copy of your healthcare power of attorney and living will documents         the day of surgery if you haven't scanned them before.              Please read over the following fact sheets you were given: IF YOU HAVE QUESTIONS ABOUT YOUR PRE-OP INSTRUCTIONS PLEASE CALL [phone]     [facility] - Preparing for Surgery Before surgery, you can play an important role.  Because skin is not sterile, your skin needs to be as free of germs as possible.  You can reduce the number of germs on your skin by washing with CHG (chlorahexidine gluconate) soap before surgery.  CHG is an antiseptic cleaner which kills germs and bonds with the skin to continue killing germs even after washing. Please DO NOT use if you have an allergy to CHG or antibacterial soaps.  If your skin becomes reddened/irritated stop using the CHG and inform your nurse when you arrive at Short Stay. Do not shave (including legs and underarms) for at  least 48 hours prior to the first CHG shower.  You may shave your face/neck. Please follow these instructions carefully:  1.  Shower with CHG Soap the night before surgery and the  morning of Surgery.  2.  If you choose to wash your hair, wash your hair first as usual with your  normal  shampoo.  3.  After you shampoo, rinse your hair and body thoroughly to remove the  shampoo.                           4.  Use CHG as you would any other liquid soap.  You can apply chg directly  to the skin and wash                       Gently with a scrungie or clean washcloth.  5.  Apply the CHG Soap to your body ONLY FROM THE NECK DOWN.   Do not use on face/ open                           Wound or open sores. Avoid contact with eyes, ears mouth and genitals (private parts).                       Wash face,  Genitals (private parts) with your normal soap.             6.  Wash thoroughly, paying special attention to the area where your surgery  will be performed.  7.  Thoroughly rinse  your body with warm water from the neck down.  8.  DO NOT shower/wash with your normal soap after using and rinsing off  the CHG Soap.                9.  Pat yourself dry with a clean towel.            10.  Wear clean pajamas.            11.  Place clean sheets on your bed the night of your first shower and do not  sleep with pets. Day of Surgery : Do not apply any lotions/deodorants the morning of surgery.  Please wear clean clothes to the hospital/surgery center.  FAILURE TO FOLLOW THESE INSTRUCTIONS MAY RESULT IN THE CANCELLATION OF YOUR SURGERY PATIENT SIGNATURE_________________________________  NURSE SIGNATURE__________________________________  ________________________________________________________________________

## 2021-10-04 NOTE — Progress Notes (Addendum)
Anesthesia Review:  PCP: DR Posey Pronto- LOV 09/24/21 - tele health visit Negative home covid test  and neg for flu  09/26/21- bacterial tonsillitis  Cardiologist : Chest x-ray :2V- 05/18/21  EKG : 05/18/21  Echo : Stress test: Cardiac Cath :  Activity level:  Sleep Study/ CPAP : Fasting Blood Sugar :      / Checks Blood Sugar -- times a day:   Blood Thinner/ Instructions /Last Dose: ASA / Instructions/ Last Dose :

## 2021-10-05 ENCOUNTER — Encounter (HOSPITAL_COMMUNITY)
Admission: RE | Admit: 2021-10-05 | Discharge: 2021-10-05 | Disposition: A | Discharge status: Home / Self Care | Payer: BC Managed Care – PPO | Source: Ambulatory Visit | Attending: General Surgery | Admitting: General Surgery

## 2021-10-05 ENCOUNTER — Other Ambulatory Visit: Payer: Self-pay | Admitting: General Surgery

## 2021-10-05 ENCOUNTER — Other Ambulatory Visit: Payer: Self-pay

## 2021-10-05 ENCOUNTER — Encounter (HOSPITAL_COMMUNITY): Payer: Self-pay

## 2021-10-05 DIAGNOSIS — Z01818 Encounter for other preprocedural examination: Secondary | ICD-10-CM

## 2021-10-05 DIAGNOSIS — Z01812 Encounter for preprocedural laboratory examination: Secondary | ICD-10-CM | POA: Insufficient documentation

## 2021-10-05 HISTORY — DX: Unspecified osteoarthritis, unspecified site: M19.90

## 2021-10-05 HISTORY — DX: Pneumonia, unspecified organism: J18.9

## 2021-10-05 LAB — SARS CORONAVIRUS 2 (TAT 6-24 HRS): SARS Coronavirus 2: NEGATIVE

## 2021-10-05 LAB — CBC WITH DIFFERENTIAL/PLATELET
Abs Immature Granulocytes: 0.01 10*3/uL (ref 0.00–0.07)
Basophils Absolute: 0 10*3/uL (ref 0.0–0.1)
Basophils Relative: 0 %
Eosinophils Absolute: 0.1 10*3/uL (ref 0.0–0.5)
Eosinophils Relative: 1 %
HCT: 38.6 % (ref 36.0–46.0)
Hemoglobin: 12.1 g/dL (ref 12.0–15.0)
Immature Granulocytes: 0 %
Lymphocytes Relative: 35 %
Lymphs Abs: 1.5 10*3/uL (ref 0.7–4.0)
MCH: 29.8 pg (ref 26.0–34.0)
MCHC: 31.3 g/dL (ref 30.0–36.0)
MCV: 95.1 fL (ref 80.0–100.0)
Monocytes Absolute: 0.4 10*3/uL (ref 0.1–1.0)
Monocytes Relative: 8 %
Neutro Abs: 2.4 10*3/uL (ref 1.7–7.7)
Neutrophils Relative %: 56 %
Platelets: 332 10*3/uL (ref 150–400)
RBC: 4.06 MIL/uL (ref 3.87–5.11)
RDW: 12.7 % (ref 11.5–15.5)
WBC: 4.4 10*3/uL (ref 4.0–10.5)
nRBC: 0 % (ref 0.0–0.2)

## 2021-10-05 LAB — COMPREHENSIVE METABOLIC PANEL
ALT: 20 U/L (ref 0–44)
AST: 20 U/L (ref 15–41)
Albumin: 3.8 g/dL (ref 3.5–5.0)
Alkaline Phosphatase: 66 U/L (ref 38–126)
Anion gap: 9 (ref 5–15)
BUN: 21 mg/dL — ABNORMAL HIGH (ref 6–20)
CO2: 27 mmol/L (ref 22–32)
Calcium: 9.4 mg/dL (ref 8.9–10.3)
Chloride: 104 mmol/L (ref 98–111)
Creatinine, Ser: 0.77 mg/dL (ref 0.44–1.00)
GFR, Estimated: >60 mL/min (ref >60)
Glucose, Bld: 100 mg/dL — ABNORMAL HIGH (ref 70–99)
Potassium: 3.9 mmol/L (ref 3.5–5.1)
Sodium: 140 mmol/L (ref 135–145)
Total Bilirubin: 0.5 mg/dL (ref 0.3–1.2)
Total Protein: 7.6 g/dL (ref 6.5–8.1)

## 2021-10-05 NOTE — Progress Notes (Signed)
COVID Vaccine Completed: Yes Date COVID Vaccine completed: 12/11/20 COVID vaccine manufacturer: Brodnax  x 3  COVID Test: 10/05/21  PCP - Dr. [name]. Cardiologist -   Chest x-ray - 05/18/21 EKG - 05/18/21 Stress Test -  ECHO - 11/14/16 Cardiac Cath -  Pacemaker/ICD device last checked:  Sleep Study -  CPAP -   Fasting Blood Sugar -  Checks Blood Sugar _____ times a day  Blood Thinner Instructions: Aspirin Instructions: Last Dose:  Anesthesia review:   Patient denies shortness of breath, fever, cough and chest pain at PAT appointment   Patient verbalized understanding of instructions that were given to them at the PAT appointment. Patient was also instructed that they will need to review over the PAT instructions again at home before surgery.

## 2021-10-08 ENCOUNTER — Encounter (HOSPITAL_COMMUNITY)
Admission: RE | Admit: 2021-10-08 | Discharge: 2021-10-08 | Disposition: A | Discharge status: Home / Self Care | Payer: BC Managed Care – PPO | Source: Ambulatory Visit | Attending: Family Medicine | Admitting: Family Medicine

## 2021-10-09 ENCOUNTER — Ambulatory Visit: Payer: BC Managed Care – PPO | Admitting: Family Medicine

## 2021-10-09 ENCOUNTER — Other Ambulatory Visit: Payer: Self-pay

## 2021-10-09 ENCOUNTER — Inpatient Hospital Stay (HOSPITAL_COMMUNITY)
Admission: RE | Admit: 2021-10-09 | Discharge: 2021-10-10 | DRG: 621 | Disposition: A | Discharge status: Home / Self Care | Payer: BC Managed Care – PPO | Attending: General Surgery | Admitting: General Surgery

## 2021-10-09 ENCOUNTER — Inpatient Hospital Stay (HOSPITAL_COMMUNITY): Payer: BC Managed Care – PPO | Admitting: Registered Nurse

## 2021-10-09 ENCOUNTER — Encounter (HOSPITAL_COMMUNITY): Payer: Self-pay | Admitting: General Surgery

## 2021-10-09 ENCOUNTER — Encounter (HOSPITAL_COMMUNITY)
Admission: RE | Disposition: A | Discharge status: Home / Self Care | Payer: Self-pay | Source: Home / Self Care | Attending: General Surgery

## 2021-10-09 DIAGNOSIS — Z8249 Family history of ischemic heart disease and other diseases of the circulatory system: Secondary | ICD-10-CM

## 2021-10-09 DIAGNOSIS — K449 Diaphragmatic hernia without obstruction or gangrene: Secondary | ICD-10-CM | POA: Diagnosis present

## 2021-10-09 DIAGNOSIS — Z9884 Bariatric surgery status: Secondary | ICD-10-CM

## 2021-10-09 DIAGNOSIS — Z833 Family history of diabetes mellitus: Secondary | ICD-10-CM

## 2021-10-09 DIAGNOSIS — Z20822 Contact with and (suspected) exposure to covid-19: Secondary | ICD-10-CM | POA: Diagnosis present

## 2021-10-09 DIAGNOSIS — I10 Essential (primary) hypertension: Secondary | ICD-10-CM | POA: Diagnosis present

## 2021-10-09 DIAGNOSIS — Z6841 Body Mass Index (BMI) 40.0 and over, adult: Secondary | ICD-10-CM | POA: Diagnosis not present

## 2021-10-09 DIAGNOSIS — Z79899 Other long term (current) drug therapy: Secondary | ICD-10-CM | POA: Diagnosis not present

## 2021-10-09 DIAGNOSIS — M1712 Unilateral primary osteoarthritis, left knee: Secondary | ICD-10-CM | POA: Diagnosis present

## 2021-10-09 HISTORY — PX: UPPER GI ENDOSCOPY: SHX6162

## 2021-10-09 LAB — HEMOGLOBIN AND HEMATOCRIT, BLOOD
HCT: 37.5 % (ref 36.0–46.0)
Hemoglobin: 11.6 g/dL — ABNORMAL LOW (ref 12.0–15.0)

## 2021-10-09 LAB — ABO/RH: ABO/RH(D): AB POS

## 2021-10-09 LAB — TYPE AND SCREEN
ABO/RH(D): AB POS
Antibody Screen: NEGATIVE

## 2021-10-09 SURGERY — XI ROBOTIC GASTRIC SLEEVE RESECTION
Anesthesia: General

## 2021-10-09 MED ORDER — SODIUM CHLORIDE 0.9 % IV SOLN
12.5000 mg | Freq: Four times a day (QID) | INTRAVENOUS | Status: DC | PRN
  Filled 2021-10-09: qty 0.5

## 2021-10-09 MED ORDER — PANTOPRAZOLE SODIUM 40 MG IV SOLR
40.0000 mg | Freq: Every day | INTRAVENOUS | Status: DC
  Administered 2021-10-09: 40 mg via INTRAVENOUS
  Filled 2021-10-09: qty 40

## 2021-10-09 MED ORDER — SODIUM CHLORIDE (PF) 0.9 % IJ SOLN
INTRAMUSCULAR | Status: AC
  Filled 2021-10-09: qty 10

## 2021-10-09 MED ORDER — MORPHINE SULFATE (PF) 2 MG/ML IV SOLN: 1.0000 mg | INTRAVENOUS | Status: DC | PRN

## 2021-10-09 MED ORDER — SIMETHICONE 80 MG PO CHEW: 80.0000 mg | CHEWABLE_TABLET | Freq: Four times a day (QID) | ORAL | Status: DC | PRN

## 2021-10-09 MED ORDER — 0.9 % SODIUM CHLORIDE (POUR BTL) OPTIME
TOPICAL | Status: DC | PRN
  Administered 2021-10-09: 1000 mL

## 2021-10-09 MED ORDER — SCOPOLAMINE 1 MG/3DAYS TD PT72
1.0000 | MEDICATED_PATCH | TRANSDERMAL | Status: DC
  Administered 2021-10-09: 1.5 mg via TRANSDERMAL
  Filled 2021-10-09: qty 1

## 2021-10-09 MED ORDER — STERILE WATER FOR IRRIGATION IR SOLN
Status: DC | PRN
  Administered 2021-10-09: 1000 mL

## 2021-10-09 MED ORDER — SUCCINYLCHOLINE CHLORIDE 200 MG/10ML IV SOSY
PREFILLED_SYRINGE | INTRAVENOUS | Status: AC
  Filled 2021-10-09: qty 10

## 2021-10-09 MED ORDER — ONDANSETRON HCL 4 MG/2ML IJ SOLN: 4.0000 mg | Freq: Four times a day (QID) | INTRAMUSCULAR | Status: DC | PRN

## 2021-10-09 MED ORDER — BUPIVACAINE LIPOSOME 1.3 % IJ SUSP
INTRAMUSCULAR | Status: AC
  Filled 2021-10-09: qty 20

## 2021-10-09 MED ORDER — BUPIVACAINE LIPOSOME 1.3 % IJ SUSP: 20.0000 mL | Freq: Once | INTRAMUSCULAR | Status: DC

## 2021-10-09 MED ORDER — DIPHENHYDRAMINE HCL 50 MG/ML IJ SOLN: 12.5000 mg | Freq: Three times a day (TID) | INTRAMUSCULAR | Status: DC | PRN

## 2021-10-09 MED ORDER — KCL IN DEXTROSE-NACL 20-5-0.45 MEQ/L-%-% IV SOLN
INTRAVENOUS | Status: DC
  Filled 2021-10-09 (×3): qty 1000

## 2021-10-09 MED ORDER — AMLODIPINE BESYLATE 10 MG PO TABS
10.0000 mg | ORAL_TABLET | Freq: Every day | ORAL | Status: DC
  Administered 2021-10-10: 10 mg via ORAL
  Filled 2021-10-09: qty 1

## 2021-10-09 MED ORDER — MIDAZOLAM HCL 2 MG/2ML IJ SOLN
INTRAMUSCULAR | Status: AC
  Filled 2021-10-09: qty 2

## 2021-10-09 MED ORDER — GABAPENTIN 300 MG PO CAPS
300.0000 mg | ORAL_CAPSULE | ORAL | Status: AC
  Administered 2021-10-09: 300 mg via ORAL
  Filled 2021-10-09: qty 1

## 2021-10-09 MED ORDER — OXYCODONE HCL 5 MG/5ML PO SOLN
5.0000 mg | Freq: Four times a day (QID) | ORAL | Status: DC | PRN
  Administered 2021-10-09: 5 mg via ORAL
  Filled 2021-10-09 (×2): qty 5

## 2021-10-09 MED ORDER — PHENYLEPHRINE 40 MCG/ML (10ML) SYRINGE FOR IV PUSH (FOR BLOOD PRESSURE SUPPORT)
PREFILLED_SYRINGE | INTRAVENOUS | Status: DC | PRN
  Administered 2021-10-09: 80 ug via INTRAVENOUS
  Administered 2021-10-09: 120 ug via INTRAVENOUS

## 2021-10-09 MED ORDER — LIDOCAINE HCL (PF) 2 % IJ SOLN
INTRAMUSCULAR | Status: AC
  Filled 2021-10-09: qty 5

## 2021-10-09 MED ORDER — CHLORHEXIDINE GLUCONATE 4 % EX LIQD: 60.0000 mL | Freq: Once | CUTANEOUS | Status: DC

## 2021-10-09 MED ORDER — LACTATED RINGERS IV SOLN
INTRAVENOUS | Status: AC | PRN
  Administered 2021-10-09: 1

## 2021-10-09 MED ORDER — BUPIVACAINE-EPINEPHRINE (PF) 0.25% -1:200000 IJ SOLN
INTRAMUSCULAR | Status: DC | PRN
  Administered 2021-10-09: 30 mL via PERINEURAL

## 2021-10-09 MED ORDER — LIDOCAINE HCL (PF) 2 % IJ SOLN
INTRAMUSCULAR | Status: AC
  Filled 2021-10-09: qty 10

## 2021-10-09 MED ORDER — FENTANYL CITRATE (PF) 100 MCG/2ML IJ SOLN
INTRAMUSCULAR | Status: DC | PRN
  Administered 2021-10-09: 25 ug via INTRAVENOUS
  Administered 2021-10-09: 100 ug via INTRAVENOUS
  Administered 2021-10-09: 25 ug via INTRAVENOUS

## 2021-10-09 MED ORDER — FENTANYL CITRATE (PF) 100 MCG/2ML IJ SOLN
INTRAMUSCULAR | Status: AC
  Filled 2021-10-09: qty 2

## 2021-10-09 MED ORDER — HEPARIN SODIUM (PORCINE) 5000 UNIT/ML IJ SOLN
5000.0000 [IU] | INTRAMUSCULAR | Status: AC
  Administered 2021-10-09: 5000 [IU] via SUBCUTANEOUS
  Filled 2021-10-09: qty 1

## 2021-10-09 MED ORDER — APREPITANT 40 MG PO CAPS
40.0000 mg | ORAL_CAPSULE | ORAL | Status: AC
  Administered 2021-10-09: 40 mg via ORAL
  Filled 2021-10-09: qty 1

## 2021-10-09 MED ORDER — BUPIVACAINE-EPINEPHRINE (PF) 0.25% -1:200000 IJ SOLN
INTRAMUSCULAR | Status: AC
  Filled 2021-10-09: qty 30

## 2021-10-09 MED ORDER — ROCURONIUM BROMIDE 10 MG/ML (PF) SYRINGE
PREFILLED_SYRINGE | INTRAVENOUS | Status: AC
  Filled 2021-10-09: qty 10

## 2021-10-09 MED ORDER — GABAPENTIN 100 MG PO CAPS
200.0000 mg | ORAL_CAPSULE | Freq: Two times a day (BID) | ORAL | Status: DC
  Administered 2021-10-09 – 2021-10-10 (×2): 200 mg via ORAL
  Filled 2021-10-09 (×2): qty 2

## 2021-10-09 MED ORDER — DEXAMETHASONE SODIUM PHOSPHATE 4 MG/ML IJ SOLN: 4.0000 mg | INTRAMUSCULAR | Status: DC

## 2021-10-09 MED ORDER — ROCURONIUM BROMIDE 10 MG/ML (PF) SYRINGE
PREFILLED_SYRINGE | INTRAVENOUS | Status: DC | PRN
Start: 2021-10-09 — End: 2021-10-09
  Administered 2021-10-09: 20 mg via INTRAVENOUS
  Administered 2021-10-09: 50 mg via INTRAVENOUS
  Administered 2021-10-09: 20 mg via INTRAVENOUS

## 2021-10-09 MED ORDER — ACETAMINOPHEN 160 MG/5ML PO SOLN: 1000.0000 mg | Freq: Three times a day (TID) | ORAL | Status: DC

## 2021-10-09 MED ORDER — FENTANYL CITRATE PF 50 MCG/ML IJ SOSY
PREFILLED_SYRINGE | INTRAMUSCULAR | Status: AC
  Filled 2021-10-09: qty 1

## 2021-10-09 MED ORDER — SUCCINYLCHOLINE CHLORIDE 200 MG/10ML IV SOSY
PREFILLED_SYRINGE | INTRAVENOUS | Status: DC | PRN
Start: 2021-10-09 — End: 2021-10-09
  Administered 2021-10-09: 120 mg via INTRAVENOUS

## 2021-10-09 MED ORDER — ONDANSETRON HCL 4 MG/2ML IJ SOLN
INTRAMUSCULAR | Status: AC
  Filled 2021-10-09: qty 2

## 2021-10-09 MED ORDER — FLUTICASONE PROPIONATE 50 MCG/ACT NA SUSP
2.0000 | Freq: Every day | NASAL | Status: DC
  Administered 2021-10-10: 2 via NASAL
  Filled 2021-10-09: qty 16

## 2021-10-09 MED ORDER — ONDANSETRON HCL 4 MG/2ML IJ SOLN
INTRAMUSCULAR | Status: DC | PRN
  Administered 2021-10-09: 4 mg via INTRAVENOUS

## 2021-10-09 MED ORDER — ENSURE MAX PROTEIN PO LIQD
2.0000 [oz_av] | ORAL | Status: DC
  Administered 2021-10-10 (×5): 2 [oz_av] via ORAL

## 2021-10-09 MED ORDER — FENTANYL CITRATE PF 50 MCG/ML IJ SOSY
PREFILLED_SYRINGE | INTRAMUSCULAR | Status: AC
  Administered 2021-10-09: 25 ug via INTRAVENOUS
  Filled 2021-10-09: qty 1

## 2021-10-09 MED ORDER — ACETAMINOPHEN 500 MG PO TABS
1000.0000 mg | ORAL_TABLET | ORAL | Status: AC
  Administered 2021-10-09: 1000 mg via ORAL
  Filled 2021-10-09: qty 2

## 2021-10-09 MED ORDER — EPHEDRINE SULFATE-NACL 50-0.9 MG/10ML-% IV SOSY
PREFILLED_SYRINGE | INTRAVENOUS | Status: DC | PRN
Start: 2021-10-09 — End: 2021-10-09
  Administered 2021-10-09: 5 mg via INTRAVENOUS
  Administered 2021-10-09: 10 mg via INTRAVENOUS

## 2021-10-09 MED ORDER — LACTATED RINGERS IV SOLN: INTRAVENOUS | Status: DC

## 2021-10-09 MED ORDER — DEXAMETHASONE SODIUM PHOSPHATE 10 MG/ML IJ SOLN
INTRAMUSCULAR | Status: DC | PRN
  Administered 2021-10-09: 6 mg via INTRAVENOUS

## 2021-10-09 MED ORDER — BUPIVACAINE LIPOSOME 1.3 % IJ SUSP
INTRAMUSCULAR | Status: DC | PRN
  Administered 2021-10-09: 20 mL

## 2021-10-09 MED ORDER — MIDAZOLAM HCL 5 MG/5ML IJ SOLN
INTRAMUSCULAR | Status: DC | PRN
  Administered 2021-10-09: 2 mg via INTRAVENOUS

## 2021-10-09 MED ORDER — SUGAMMADEX SODIUM 500 MG/5ML IV SOLN
INTRAVENOUS | Status: DC | PRN
  Administered 2021-10-09: 300 mg via INTRAVENOUS

## 2021-10-09 MED ORDER — FENTANYL CITRATE PF 50 MCG/ML IJ SOSY
25.0000 ug | PREFILLED_SYRINGE | INTRAMUSCULAR | Status: DC | PRN
  Administered 2021-10-09: 25 ug via INTRAVENOUS
  Administered 2021-10-09: 50 ug via INTRAVENOUS

## 2021-10-09 MED ORDER — PROPOFOL 10 MG/ML IV BOLUS
INTRAVENOUS | Status: DC | PRN
  Administered 2021-10-09: 150 mg via INTRAVENOUS

## 2021-10-09 MED ORDER — LIDOCAINE 2% (20 MG/ML) 5 ML SYRINGE
INTRAMUSCULAR | Status: DC | PRN
Start: 2021-10-09 — End: 2021-10-09
  Administered 2021-10-09: 100 mg via INTRAVENOUS
  Administered 2021-10-09: 1.5 mg/kg/h via INTRAVENOUS

## 2021-10-09 MED ORDER — SODIUM CHLORIDE 0.9 % IV SOLN
2.0000 g | INTRAVENOUS | Status: AC
  Administered 2021-10-09: 2 g via INTRAVENOUS
  Filled 2021-10-09: qty 2

## 2021-10-09 MED ORDER — DEXAMETHASONE SODIUM PHOSPHATE 10 MG/ML IJ SOLN
INTRAMUSCULAR | Status: AC
  Filled 2021-10-09: qty 1

## 2021-10-09 MED ORDER — ORAL CARE MOUTH RINSE: 15.0000 mL | Freq: Once | OROMUCOSAL | Status: AC

## 2021-10-09 MED ORDER — ENOXAPARIN SODIUM 30 MG/0.3ML IJ SOSY
30.0000 mg | PREFILLED_SYRINGE | Freq: Two times a day (BID) | INTRAMUSCULAR | Status: DC
  Administered 2021-10-09 – 2021-10-10 (×2): 30 mg via SUBCUTANEOUS
  Filled 2021-10-09 (×2): qty 0.3

## 2021-10-09 MED ORDER — SUGAMMADEX SODIUM 500 MG/5ML IV SOLN
INTRAVENOUS | Status: AC
  Filled 2021-10-09: qty 5

## 2021-10-09 MED ORDER — ACETAMINOPHEN 500 MG PO TABS
1000.0000 mg | ORAL_TABLET | Freq: Three times a day (TID) | ORAL | Status: DC
  Administered 2021-10-09 – 2021-10-10 (×4): 1000 mg via ORAL
  Filled 2021-10-09 (×4): qty 2

## 2021-10-09 MED ORDER — PHENYLEPHRINE HCL (PRESSORS) 10 MG/ML IV SOLN
INTRAVENOUS | Status: AC
  Filled 2021-10-09: qty 2

## 2021-10-09 MED ORDER — PROPOFOL 10 MG/ML IV BOLUS
INTRAVENOUS | Status: AC
  Filled 2021-10-09: qty 40

## 2021-10-09 MED ORDER — CHLORHEXIDINE GLUCONATE 0.12 % MT SOLN
15.0000 mL | Freq: Once | OROMUCOSAL | Status: AC
  Administered 2021-10-09: 15 mL via OROMUCOSAL

## 2021-10-09 MED ORDER — PHENYLEPHRINE HCL-NACL 20-0.9 MG/250ML-% IV SOLN
INTRAVENOUS | Status: DC | PRN
  Administered 2021-10-09: 15 ug/min via INTRAVENOUS

## 2021-10-09 MED ORDER — HYDRALAZINE HCL 20 MG/ML IJ SOLN: 20.0000 mg | INTRAMUSCULAR | Status: DC | PRN

## 2021-10-09 SURGICAL SUPPLY — 84 items
ADH SKN CLS APL DERMABOND .7 (GAUZE/BANDAGES/DRESSINGS)
APL PRP STRL LF DISP 70% ISPRP (MISCELLANEOUS) ×2
APPLIER CLIP 5 13 M/L LIGAMAX5 (MISCELLANEOUS) ×2
APPLIER CLIP ROT 13.4 12 LRG (CLIP)
APR CLP LRG 13.4X12 ROT 20 MLT (CLIP)
APR CLP MED LRG 5 ANG JAW (MISCELLANEOUS) ×1
BAG COUNTER SPONGE SURGICOUNT (BAG) ×2 IMPLANT
BAG SPNG CNTER NS LX DISP (BAG) ×1
BLADE SURG SZ11 CARB STEEL (BLADE) ×2 IMPLANT
CANNULA REDUC XI 12-8 STAPL (CANNULA) ×2
CANNULA REDUCER 12-8 DVNC XI (CANNULA) ×1 IMPLANT
CHLORAPREP W/TINT 26 (MISCELLANEOUS) ×4 IMPLANT
CLIP APPLIE 5 13 M/L LIGAMAX5 (MISCELLANEOUS) IMPLANT
CLIP APPLIE ROT 13.4 12 LRG (CLIP) IMPLANT
COVER SURGICAL LIGHT HANDLE (MISCELLANEOUS) ×2 IMPLANT
COVER TIP SHEARS 8 DVNC (MISCELLANEOUS) IMPLANT
COVER TIP SHEARS 8MM DA VINCI (MISCELLANEOUS)
DECANTER SPIKE VIAL GLASS SM (MISCELLANEOUS) ×2 IMPLANT
DERMABOND ADVANCED (GAUZE/BANDAGES/DRESSINGS)
DERMABOND ADVANCED .7 DNX12 (GAUZE/BANDAGES/DRESSINGS) IMPLANT
DRAPE ARM DVNC X/XI (DISPOSABLE) ×4 IMPLANT
DRAPE COLUMN DVNC XI (DISPOSABLE) ×1 IMPLANT
DRAPE DA VINCI XI ARM (DISPOSABLE) ×8
DRAPE DA VINCI XI COLUMN (DISPOSABLE) ×2
DRSG TEGADERM 2-3/8X2-3/4 SM (GAUZE/BANDAGES/DRESSINGS) ×1 IMPLANT
ELECT REM PT RETURN 15FT ADLT (MISCELLANEOUS) ×2 IMPLANT
ENDOLOOP SUT PDS II  0 18 (SUTURE)
ENDOLOOP SUT PDS II 0 18 (SUTURE) IMPLANT
GAUZE SPONGE 2X2 8PLY STRL LF (GAUZE/BANDAGES/DRESSINGS) IMPLANT
GLOVE SRG 8 PF TXTR STRL LF DI (GLOVE) ×2 IMPLANT
GLOVE SURG POLY ORTHO LF SZ7.5 (GLOVE) ×4 IMPLANT
GLOVE SURG UNDER POLY LF SZ8 (GLOVE) ×4
GOWN STRL REUS W/TWL XL LVL3 (GOWN DISPOSABLE) ×4 IMPLANT
GRASPER SUT TROCAR 14GX15 (MISCELLANEOUS) ×2 IMPLANT
IRRIG SUCT STRYKERFLOW 2 WTIP (MISCELLANEOUS) ×2
IRRIGATION SUCT STRKRFLW 2 WTP (MISCELLANEOUS) ×1 IMPLANT
KIT BASIN OR (CUSTOM PROCEDURE TRAY) ×2 IMPLANT
KIT TURNOVER KIT A (KITS) IMPLANT
MARKER SKIN DUAL TIP RULER LAB (MISCELLANEOUS) ×2 IMPLANT
MAT PREVALON FULL STRYKER (MISCELLANEOUS) ×2 IMPLANT
NDL SPNL 22GX3.5 QUINCKE BK (NEEDLE) ×1 IMPLANT
NEEDLE SPNL 22GX3.5 QUINCKE BK (NEEDLE) ×2 IMPLANT
OBTURATOR OPTICAL STANDARD 8MM (TROCAR)
OBTURATOR OPTICAL STND 8 DVNC (TROCAR)
OBTURATOR OPTICALSTD 8 DVNC (TROCAR) IMPLANT
PACK CARDIOVASCULAR III (CUSTOM PROCEDURE TRAY) ×2 IMPLANT
RELOAD STAPLE 60 2.5 WHT DVNC (STAPLE) IMPLANT
RELOAD STAPLE 60 3.5 BLU DVNC (STAPLE) IMPLANT
RELOAD STAPLE 60 4.3 GRN DVNC (STAPLE) IMPLANT
RELOAD STAPLER 2.5X60 WHT DVNC (STAPLE) ×6 IMPLANT
RELOAD STAPLER 3.5X60 BLU DVNC (STAPLE) ×1 IMPLANT
RELOAD STAPLER 4.3X60 GRN DVNC (STAPLE) IMPLANT
SCISSORS LAP 5X35 DISP (ENDOMECHANICALS) ×1 IMPLANT
SEAL CANN UNIV 5-8 DVNC XI (MISCELLANEOUS) ×3 IMPLANT
SEAL XI 5MM-8MM UNIVERSAL (MISCELLANEOUS) ×6
SEALER VESSEL DA VINCI XI (MISCELLANEOUS) ×2
SEALER VESSEL EXT DVNC XI (MISCELLANEOUS) ×1 IMPLANT
SLEEVE GASTRECTOMY 40FR VISIGI (MISCELLANEOUS) ×2 IMPLANT
SOL ANTI FOG 6CC (MISCELLANEOUS) ×1 IMPLANT
SOLUTION ANTI FOG 6CC (MISCELLANEOUS) ×1
SOLUTION ELECTROLUBE (MISCELLANEOUS) ×2 IMPLANT
SPONGE GAUZE 2X2 STER 10/PKG (GAUZE/BANDAGES/DRESSINGS) ×1
SPONGE T-LAP 18X18 ~~LOC~~+RFID (SPONGE) ×2 IMPLANT
STAPLER 60 DA VINCI SURE FORM (STAPLE) ×2
STAPLER 60 SUREFORM DVNC (STAPLE) ×1 IMPLANT
STAPLER CANNULA SEAL DVNC XI (STAPLE) ×1 IMPLANT
STAPLER CANNULA SEAL XI (STAPLE) ×2
STAPLER RELOAD 2.5X60 WHITE (STAPLE) ×12
STAPLER RELOAD 2.5X60 WHT DVNC (STAPLE) ×6
STAPLER RELOAD 3.5X60 BLU DVNC (STAPLE) ×1
STAPLER RELOAD 3.5X60 BLUE (STAPLE) ×2
STAPLER RELOAD 4.3X60 GREEN (STAPLE)
STAPLER RELOAD 4.3X60 GRN DVNC (STAPLE)
SUT ETHIBOND 0 36 GRN (SUTURE) ×1 IMPLANT
SUT MNCRL AB 4-0 PS2 18 (SUTURE) ×2 IMPLANT
SUT VICRYL 0 TIES 12 18 (SUTURE) ×2 IMPLANT
SUT VLOC 180 2-0 9IN GS21 (SUTURE) IMPLANT
SYR 20ML LL LF (SYRINGE) ×2 IMPLANT
TAPE STRIPS DRAPE STRL (GAUZE/BANDAGES/DRESSINGS) ×1 IMPLANT
TOWEL OR 17X26 10 PK STRL BLUE (TOWEL DISPOSABLE) ×2 IMPLANT
TOWEL OR NON WOVEN STRL DISP B (DISPOSABLE) IMPLANT
TRAY FOLEY MTR SLVR 16FR STAT (SET/KITS/TRAYS/PACK) IMPLANT
TROCAR BLADELESS OPT 5 100 (ENDOMECHANICALS) ×2 IMPLANT
TUBING INSUFFLATION 10FT LAP (TUBING) ×2 IMPLANT

## 2021-10-09 NOTE — Transfer of Care (Signed)
Immediate Anesthesia Transfer of Care Note  Patient: [name]  Procedure(s) Performed: XI ROBOTIC GASTRIC SLEEVE RESECTION;HIATAL HERNIA REPAIR UPPER GI ENDOSCOPY  Patient Location: PACU  Anesthesia Type:General  Level of Consciousness: awake, alert , oriented and patient cooperative  Airway & Oxygen Therapy: Patient Spontanous Breathing and Patient connected to face mask oxygen  Post-op Assessment: Report given to RN, Post -op Vital signs reviewed and stable and Patient moving all extremities  Post vital signs: Reviewed and stable  Last Vitals:  Vitals Value Taken Time  BP 154/81 10/09/21 1337  Temp    Pulse 92 10/09/21 1339  Resp 11 10/09/21 1339  SpO2 100 % 10/09/21 1339  Vitals shown include unvalidated device data.  Last Pain:  Vitals:   10/09/21 0939  TempSrc: Oral  PainSc: 0-No pain         Complications: No notable events documented.

## 2021-10-09 NOTE — Anesthesia Procedure Notes (Signed)

## 2021-10-09 NOTE — Anesthesia Preprocedure Evaluation (Signed)
Anesthesia Evaluation  Patient identified by MRN, date of birth, ID band Patient awake    Reviewed: Allergy & Precautions, NPO status   Airway Mallampati: II  TM Distance: >3 FB     Dental   Pulmonary pneumonia,    breath sounds clear to auscultation       Cardiovascular hypertension,  Rhythm:Regular Rate:Normal     Neuro/Psych  Neuromuscular disease    GI/Hepatic Neg liver ROS, History noted Dr. Nyoka Cowden   Endo/Other    Renal/GU negative Renal ROS     Musculoskeletal   Abdominal   Peds  Hematology   Anesthesia Other Findings   Reproductive/Obstetrics                             Anesthesia Physical Anesthesia Plan  ASA: 3  Anesthesia Plan: General   Post-op Pain Management:    Induction: Intravenous  PONV Risk Score and Plan: 3 and Ondansetron, Dexamethasone, Midazolam and Treatment may vary due to age or medical condition  Airway Management Planned: Oral ETT  Additional Equipment:   Intra-op Plan:   Post-operative Plan: Possible Post-op intubation/ventilation  Informed Consent: I have reviewed the patients History and Physical, chart, labs and discussed the procedure including the risks, benefits and alternatives for the proposed anesthesia with the patient or authorized representative who has indicated his/her understanding and acceptance.     Dental advisory given  Plan Discussed with: CRNA and Anesthesiologist  Anesthesia Plan Comments:         Anesthesia Quick Evaluation

## 2021-10-09 NOTE — Progress Notes (Signed)

## 2021-10-09 NOTE — H&P (Signed)
DOB: 03/27/1961 DATE OF ENCOUNTER: 09/28/2021  Subjective   Chief Complaint: Pre-op Exam   History of Present Illness: [name] is a 60 y.o. female who is seen today as an office consultation at the request of Dr. Pasty Arch for evaluation of Pre-op Exam .   She comes in for long-term follow-up regarding her severe obesity and comorbidities of hypertension, left knee osteoarthritis and elevated LDL. I last saw her in the clinic in May of this year. I initially met her back in 2018 when she first considered weight loss surgery. She denies any significant medical changes since I last saw her May other than she had a mammogram with a follow-up ultrasound and she is scheduled to undergo a biopsy in November but the radiologist felt the lesion was very low suspicious and recommended that she proceed with bariatric surgery. She also had a case of tonsillitis about 10 days ago. She still working on recovering her voice. No fever or chills or nausea or vomiting. She took 1 day of a Z-Pak and stopped. She had multiple negative COVID test. Her flu and strep test were officially negative as well.  She had a colonoscopy in July which I reviewed she had 2 small less than 4 mm polyps which were removed and were benign.  Chest x-ray and upper GI in June were within normal limits. She had a CT scan of her abdomen pelvis in March which I reviewed she just showed a uterine fibroid and a simple renal cyst. She had a previous coronary artery CT in December 2021 which had a coronary calcium score of 0 and no obvious plaque.  She denies any abdominal pain, GERD. Chest pain, chest pressure, shortness of  Review of Systems: A complete review of systems was obtained from the patient. I have reviewed this information and discussed as appropriate with the patient. See HPI as well for other ROS.  ROS   Medical History: Past Medical History:  Diagnosis Date   Arthritis   Hypertension   There is no problem list on  file for this patient.  Past Surgical History:  Procedure Laterality Date   CESAREAN SECTION    No Known Allergies  Current Outpatient Medications on File Prior to Visit  Medication Sig Dispense Refill   amLODIPine (NORVASC) 10 MG tablet amlodipine 10 mg tablet TAKE 1 TABLET BY MOUTH ONCE DAILY   amoxicillin (AMOXIL) 500 MG capsule Take 500 mg by mouth 2 (two) times daily   ascorbic acid, vitamin C, 500 mg Chew Take by mouth   biotin 10 mg Tab Take by mouth   cholecalciferol, vitamin D3, 10 mcg (400 unit) Cap Take by mouth   clotrimazole-betamethasone (LOTRISONE) 1-0.05 % cream clotrimazole-betamethasone 1 %-0.05 % topical cream APPLY 1 APPLICATION OF CREAM TOPICALLY TWICE DAILY   cyanocobalamin (VITAMIN B12) 100 MCG tablet Take by mouth   fluticasone propionate (FLONASE) 50 mcg/actuation nasal spray fluticasone propionate 50 mcg/actuation nasal spray,suspension   meclizine (ANTIVERT) 25 mg tablet meclizine 25 mg tablet   montelukast (SINGULAIR) 10 mg tablet montelukast 10 mg tablet TAKE 1 TABLET BY MOUTH AT BEDTIME   multivitamin tablet Take 1 tablet by mouth once daily   zinc gluconate 50 mg tablet Take by mouth   No current facility-administered medications on file prior to visit.   Family History  Problem Relation Age of Onset   High blood pressure (Hypertension) Mother   Diabetes Mother   High blood pressure (Hypertension) Sister   High blood pressure (Hypertension)  Brother    Social History   Tobacco Use  Smoking Status Never Smoker  Smokeless Tobacco Never Used    Social History   Socioeconomic History   Marital status: Married  Tobacco Use   Smoking status: Never Smoker   Smokeless tobacco: Never Used  Scientific laboratory technician Use: Never used  Substance and Sexual Activity   Alcohol use: Defer   Drug use: Never   Objective:   Vitals:  09/28/21 1621  BP: (!) 142/82  Pulse: 91  Temp: 36.2 C (97.1 F)  SpO2: 97%  Weight: (!) 136.4 kg (300 lb 12.8 oz)   Height: 167.6 cm (5' 6" )   Body mass index is 48.55 kg/m.  Constitutional: NAD; conversant; no deformities; severe obesity Eyes: Moist conjunctiva; no lid lag; anicteric; PERRL Neck: Trachea midline; no thyromegaly Lungs: Normal respiratory effort; no tactile fremitus CV: RRR; no palpable thrills; no pitting edema GI: Abd soft, nt; no palpable hepatosplenomegaly MSK: Normal gait; no clubbing/cyanosis Psychiatric: Appropriate affect; alert and oriented x3 Lymphatic: No palpable cervical or axillary lymphadenopathy Skin:no rash/jaundice  Labs, Imaging and Diagnostic Testing: Documented above  Assessment and Plan:  Diagnoses and all orders for this visit:  Severe obesity (CMS-HCC)  Hypertension, essential  Elevated LDL cholesterol level  Primary osteoarthritis of left knee    We reviewed her work-up to date which we had done at her last visit. We discussed her colonoscopy. We discussed her recent diagnosis of tonsillitis. She has completed her preoperative education class. We discussed the importance of the preoperative meal plan. We discussed the typical hospitalization again as well as the typical recovery. We discussed again the typical quirks that we see or issues that we see commonly in the first few weeks after surgery. She signed the surgical consent form. All of her questions were asked and answered.  This patient encounter took 25 minutes today to perform the following: take history, perform exam, review outside records, interpret imaging, counsel the patient on their diagnosis and document encounter, findings & plan in the EHR  No follow-ups on file.  Mourad Cwikla Leanne Chang, MD  General, Minimally Invasive, & Bariatric Surgery

## 2021-10-09 NOTE — Interval H&P Note (Signed)
History and Physical Interval Note:  10/09/2021 10:30 AM  [name]  has presented today for surgery, with the diagnosis of morbid obesity.  The various methods of treatment have been discussed with the patient and family. After consideration of risks, benefits and other options for treatment, the patient has consented to  Procedure(s): XI ROBOTIC GASTRIC SLEEVE RESECTION (N/A) UPPER GI ENDOSCOPY (N/A) as a surgical intervention.  The patient's history has been reviewed, patient examined, no change in status, stable for surgery.  I have reviewed the patient's chart and labs.  Questions were answered to the patient's satisfaction.    Leighton Ruff. Redmond Pulling, MD, FACS General, Bariatric, & Minimally Invasive Surgery [facility]  Greer Pickerel

## 2021-10-09 NOTE — Progress Notes (Signed)
Patient was not feeling up to walking in the hallway this shift, reports that she will walk on night shift around 7:30pm. Will report off to night shift.

## 2021-10-09 NOTE — Progress Notes (Signed)
PHARMACY CONSULT FOR:  Risk Assessment for Post-Discharge VTE Following Bariatric Surgery  Post-Discharge VTE Risk Assessment: This patient's probability of 30-day post-discharge VTE is increased due to the factors marked:   Female    Age >/=60 years    BMI >/=50 kg/m2    CHF    Dyspnea at Rest    Paraplegia  X  Non-gastric-band surgery    Operation Time >/=3 hr    Return to OR     Length of Stay >/= 3 d   Hx of VTE   Hypercoagulable condition   Significant venous stasis    Predicted probability of 30-day post-discharge VTE: 0.16%  Other patient-specific factors to consider: N/A  Recommendation for Discharge: No pharmacologic prophylaxis post-discharge  CIJI BOSTON is a 60 y.o. female who underwent XI robotic sleeve gastrectomy with sliding hiatal hernia repair and upper GI endoscopy on 10/09/2021.  Allergies  Allergen Reactions   Sulfamethoxazole-Trimethoprim Nausea Only    Patient Measurements: Height: 5\' 6"  (167.6 cm) Weight: 134.7 kg (297 lb) IBW/kg (Calculated) : 59.3 Body mass index is 47.94 kg/m.  No results for input(s): WBC, HGB, HCT, PLT, APTT, CREATININE, LABCREA, CREATININE, CREAT24HRUR, MG, PHOS, ALBUMIN, PROT, ALBUMIN, AST, ALT, ALKPHOS, BILITOT, BILIDIR, IBILI in the last 72 hours. Estimated Creatinine Clearance: 108.3 mL/min (by C-G formula based on SCr of 0.77 mg/dL).   Past Medical History:  Diagnosis Date   Arthritis    Hypertension    Joint pain    Left knee pain    Pain of right clavicle 10/17/2018   Pneumonia    Seasonal allergies     Medications Prior to Admission  Medication Sig Dispense Refill Last Dose   amLODipine (NORVASC) 10 MG tablet Take 1 tablet by mouth once daily 90 tablet 0 10/09/2021 at 0630   amoxicillin (AMOXIL) 500 MG tablet Take 500 mg by mouth 2 (two) times daily. Finished Saturday      Biotin 10000 MCG TABS Take 10,000 mcg by mouth daily.   09/25/2021   Cholecalciferol (VITAMIN D3) 10 MCG (400 UNIT) CAPS Take 400  Units by mouth daily.   09/25/2021   fluticasone (FLONASE) 50 MCG/ACT nasal spray Place 2 sprays into both nostrils daily. 16 g 2 10/09/2021   MAGNESIUM PO Take 1 tablet by mouth daily.   09/25/2021   Multiple Vitamin (MULTIVITAMIN) tablet Take 1 tablet by mouth daily.   09/25/2021   omega-3 acid ethyl esters (LOVAZA) 1 g capsule Take 1 g by mouth daily.   09/25/2021   vitamin C (ASCORBIC ACID) 500 MG tablet Take 500 mg by mouth daily.   09/25/2021   zinc gluconate 50 MG tablet Take 50 mg by mouth daily.   09/25/2021   cetirizine (ZYRTEC) 10 MG tablet Take 10 mg by mouth daily as needed for allergies.   More than a month   clotrimazole-betamethasone (LOTRISONE) cream Apply 1 application topically 2 (two) times daily. (Patient not taking: Reported on 10/02/2021) 45 g 1 Not Taking    Levan Aloia A Etha Stambaugh 10/09/2021,2:29 PM

## 2021-10-09 NOTE — Op Note (Signed)
10/09/2021 [name] 1960-12-26 [id number]   PRE-OPERATIVE DIAGNOSIS:   Severe obesity (BMI 48)  Hypertension, essential  Elevated LDL cholesterol level  Primary osteoarthritis of left knee  POST-OPERATIVE DIAGNOSIS:  same + sliding hiatal hernia  PROCEDURE:  Procedure(s): Xi robotic SLEEVE GASTRECTOMY WITH SLIDING HIATAL HERNIA REPAIR UPPER GI ENDOSCOPY  SURGEON:  Surgeon(s): Gayland Curry, MD FACS FASMBS  ASSISTANTS: Romana Juniper MD FACS  ANESTHESIA:   general  DRAINS: none   BOUGIE: 40 fr ViSiGi  LOCAL MEDICATIONS USED:   Exparel  EBL: minimal  SPECIMEN:  Source of Specimen:  Greater curvature of stomach  DISPOSITION OF SPECIMEN:  PATHOLOGY  COUNTS:  YES  INDICATION FOR PROCEDURE: This is a very pleasant 60 y.o.-year-old severely obese female who has had unsuccessful attempts for sustained weight loss. The patient presents today for a planned laparoscopic/robotic sleeve gastrectomy with upper endoscopy. We have discussed the risk and benefits of the procedure extensively preoperatively. Please see my separate notes.  PROCEDURE: After obtaining informed consent and receiving 5000 units of subcutaneous heparin, the patient was brought to the operating room at [facility] and placed supine on the operating room table. General endotracheal anesthesia was established. Sequential compression devices were placed. A orogastric tube was placed. The patient's abdomen was prepped and draped in the usual standard surgical fashion. The patient received preoperative IV antibiotic. A surgical timeout was performed. ERAS protocol used.   Access to the abdomen was achieved using a 5 mm 0 laparoscope thru a 5 mm trocar In the mid clavicular line in the left midabdomen about 3 cm to the left at the level of the umbilicus using the Optiview technique. Pneumoperitoneum was smoothly established up to 15 mm of mercury. The laparoscope was advanced and the abdominal cavity was  surveilled. The patient was then placed in reverse Trendelenburg.  A tap block was performed on patient's right side under direct visualization.  A 12 mm robotic trocar was placed in the mid right abdomen.  The patient was rotated to the right.  The optical entry trocar in the left midabdomen was changed to a 8 mm robotic trocar under direct visualization. An 8mm robotic trocar was placed in the lateral left abdominal wall also under direct visualization.  The [facility] liver retractor was placed under the left lobe of the liver through a 5 mm trocar incision site in the subxiphoid position. A final 5 mm trocar was placed in the lateral RUQ.  A tap block was performed along the patient's left side also under direct laparoscopic visualization.  TAP block performed for postoperative pain relief   The XI robot was then docked and targeted for upper abdominal anatomy.  Arm 2  was attached to the left paraumbilical abdominal trocar and the camera was inserted.  Anatomy was targeted.  Arms 1 and ,3,4 were then attached to the robotic trochars.  A fenestrated bipolar was placed in arm 1.  A vessel sealer was placed in arm 3, tip up grasper in arm 4.  My assistant stayed at the bedside while I went to the robotic console.  The stomach was inspected. It was completely decompressed and the orogastric tube was removed.  There was no anterior dimple that was obviously visible. Her preop UGI showed no hiatal hernia.     We identified the pylorus and measured 6 cm proximal to the pylorus and identified an area of where we would start taking down the short gastric vessels.  The vessel sealer was  used to take down the short gastric vessels along the greater curvature of the stomach. We were able to enter the lesser sac. We continued to march along the greater curvature of the stomach taking down the short gastrics.  My assistant provided traction laterally to patient's right side.  as we approached the gastrosplenic  ligament we took care in this area not to injure the spleen. We were able to take down the entire gastrosplenic ligament. We then mobilized the fundus away from the left crus of diaphragm. There were not any significant posterior gastric avascular attachments. This left the stomach completely mobilized. No vessels had been taken down along the lesser curvature of the stomach. There was no posterior plug of fat at the hiatus. However there was some redundant fundus at the crus  I decided to inspect hiatus.  The gastrohepatic ligament was incised with the vessel sealer.  The right crus was identified.  I incised the peritoneum over the right crus with the vessel sealer.  Then using gentle blunt dissection I mobilized the distal esophagus at the hiatus.  Identified the left crus.  Identified the confluence of the left and right crus.  There appeared to be a sliding hiatal hernia component.  I was able to mobilize the distal esophagus and completely reduce the cardia.  The entire stomach was now within the abdominal cavity.  This revealed a gap between the left and right crus.  I ended up placing a single 0 Ethibond suture reapproximating the left and right crus.   We then reidentified the pylorus. A 40Fr ViSiGi was then placed in the oropharynx and advanced down into the stomach and placed in the distal antrum and positioned along the lesser curvature. It was placed under suction which secured the 40Fr ViSiGi in place along the lesser curve. Then using the robotic 60 mm stapler with a blue load, I placed a stapler along the antrum approximately 6cm from the pylorus. The stapler was angled so that there is ample room at the angularis incisura. I then fired the first staple load after inspecting it posteriorly to ensure adequate space both anteriorly and posteriorly.   The stapler did not have to pause for compression so at this point I started using white loads. The robotic stapler was then repositioned with a 60 mm  white load  and we continued to march up along the [place]. My assistant was exchanging the stapler after firing.  The stapler did pause for compression about 8 times with the first white load and about 6 times with a second white load.  The tip up grasper was holding traction along the greater curvature stomach along the cauterized short gastric vessels ensuring that the stomach was symmetrically retracted. Prior to each firing of the staple, we rotated the stomach to ensure that there is adequate stomach left.  As we approached the fundus, I continued using 60 mm white cartridge aiming  lateral to the GE junction after mobilizing some of the esophageal fat pad.  The sleeve was inspected. There is no evidence of cork screw. The staple line appeared hemostatic . The CRNA inflated the ViSiGi to the green zone and the upper abdomen was flooded with saline. There were no bubbles. The sleeve was decompressed and the ViSiGi removed.I performed an upper endoscopy.  The endoscope was placed in the patient's oropharynx and gently glided down.  Z-line at approximately 36cm.  Z-line was normal.  The endoscope was advanced into the gastric sleeve and insufflated.  I was able to easily advance the endoscope down into the antrum.  Pylorus was visible.  There is no significant stricture at the incisura.  There was no corkscrewing.  The external staple line was re-inspected and there was a little bit of oozing at 1 location of the staple line it was along the fourth fire.  My assistant placed two 5 mm clips on this which achieved hemostasis. There is no evidence of internal bleeding.  The sleeve was decompressed and the endoscope was removed.   The greater curvature the stomach was grasped with a laparoscopic grasper.  At this point I scrubbed back in.  The robot was undocked.  We removed the specimen from the 12 mm trocar site.  The liver retractor was removed. I then closed the 12 mm trocar site with 1 interrupted 0 Vicryl  sutures through the fascia using the endoclose. The closure was viewed laparoscopically and it was airtight. Remaining Exparel was then infiltrated in the preperitoneal spaces around the trocar sites. Pneumoperitoneum was released. All trocar sites were closed with a 4-0 Monocryl in a subcuticular fashion followed by the application of steri-strips, gauze, & tegaderms. The patient was extubated and taken to the recovery room in stable condition. All needle, instrument, and sponge counts were correct x2. There are no immediate complications  (1) 60 mm blue (6) 60 mm white  PLAN OF CARE: Admit to inpatient   PATIENT DISPOSITION:  PACU - hemodynamically stable.   Delay start of Pharmacological VTE agent (>24hrs) due to surgical blood loss or risk of bleeding:  no   Leighton Ruff. Redmond Pulling, MD, FACS FASMBS General, Bariatric, & Minimally Invasive Surgery [facility] [place]

## 2021-10-10 ENCOUNTER — Encounter (HOSPITAL_COMMUNITY): Payer: Self-pay | Admitting: General Surgery

## 2021-10-10 ENCOUNTER — Other Ambulatory Visit (HOSPITAL_COMMUNITY): Payer: Self-pay

## 2021-10-10 LAB — CBC WITH DIFFERENTIAL/PLATELET
Abs Immature Granulocytes: 0.05 10*3/uL (ref 0.00–0.07)
Basophils Absolute: 0 10*3/uL (ref 0.0–0.1)
Basophils Relative: 0 %
Eosinophils Absolute: 0 10*3/uL (ref 0.0–0.5)
Eosinophils Relative: 0 %
HCT: 35.7 % — ABNORMAL LOW (ref 36.0–46.0)
Hemoglobin: 11.3 g/dL — ABNORMAL LOW (ref 12.0–15.0)
Immature Granulocytes: 1 %
Lymphocytes Relative: 14 %
Lymphs Abs: 1.3 10*3/uL (ref 0.7–4.0)
MCH: 30.1 pg (ref 26.0–34.0)
MCHC: 31.7 g/dL (ref 30.0–36.0)
MCV: 94.9 fL (ref 80.0–100.0)
Monocytes Absolute: 1 10*3/uL (ref 0.1–1.0)
Monocytes Relative: 11 %
Neutro Abs: 6.7 10*3/uL (ref 1.7–7.7)
Neutrophils Relative %: 74 %
Platelets: 296 10*3/uL (ref 150–400)
RBC: 3.76 MIL/uL — ABNORMAL LOW (ref 3.87–5.11)
RDW: 12.9 % (ref 11.5–15.5)
WBC: 9.1 10*3/uL (ref 4.0–10.5)
nRBC: 0 % (ref 0.0–0.2)

## 2021-10-10 LAB — COMPREHENSIVE METABOLIC PANEL
ALT: 29 U/L (ref 0–44)
AST: 32 U/L (ref 15–41)
Albumin: 3.5 g/dL (ref 3.5–5.0)
Alkaline Phosphatase: 63 U/L (ref 38–126)
Anion gap: 5 (ref 5–15)
BUN: 11 mg/dL (ref 6–20)
CO2: 30 mmol/L (ref 22–32)
Calcium: 9.5 mg/dL (ref 8.9–10.3)
Chloride: 104 mmol/L (ref 98–111)
Creatinine, Ser: 0.75 mg/dL (ref 0.44–1.00)
GFR, Estimated: >60 mL/min (ref >60)
Glucose, Bld: 140 mg/dL — ABNORMAL HIGH (ref 70–99)
Potassium: 4.4 mmol/L (ref 3.5–5.1)
Sodium: 139 mmol/L (ref 135–145)
Total Bilirubin: 0.5 mg/dL (ref 0.3–1.2)
Total Protein: 7.1 g/dL (ref 6.5–8.1)

## 2021-10-10 LAB — SURGICAL PATHOLOGY

## 2021-10-10 MED ORDER — OXYCODONE HCL 5 MG PO TABS
5.0000 mg | ORAL_TABLET | Freq: Four times a day (QID) | ORAL | 0 refills | Status: DC | PRN
  Filled 2021-10-10: qty 10, 3d supply, fill #0

## 2021-10-10 MED ORDER — PANTOPRAZOLE SODIUM 40 MG PO TBEC
40.0000 mg | DELAYED_RELEASE_TABLET | Freq: Every day | ORAL | 0 refills | Status: DC
  Filled 2021-10-10: qty 90, 90d supply, fill #0

## 2021-10-10 MED ORDER — ACETAMINOPHEN 500 MG PO TABS: 1000.0000 mg | ORAL_TABLET | Freq: Three times a day (TID) | ORAL | 0 refills | Status: AC

## 2021-10-10 MED ORDER — GABAPENTIN 100 MG PO CAPS
200.0000 mg | ORAL_CAPSULE | Freq: Two times a day (BID) | ORAL | 0 refills | Status: DC
  Filled 2021-10-10: qty 20, 5d supply, fill #0

## 2021-10-10 MED ORDER — ONDANSETRON 4 MG PO TBDP
4.0000 mg | ORAL_TABLET | Freq: Four times a day (QID) | ORAL | 0 refills | Status: DC | PRN
  Filled 2021-10-10: qty 18, 21d supply, fill #0

## 2021-10-10 MED ORDER — MENTHOL 3 MG MT LOZG
1.0000 | LOZENGE | OROMUCOSAL | Status: DC | PRN
  Filled 2021-10-10: qty 9

## 2021-10-10 NOTE — Progress Notes (Signed)
Patient alert and oriented, pain is controlled. Patient is tolerating fluids, advanced to protein shake today, patient is tolerating well.  Reviewed Gastric sleeve discharge instructions with patient and patient is able to articulate understanding.  Provided information on BELT program, Support Group and [facility] outpatient pharmacy. All questions answered, will continue to monitor.  

## 2021-10-10 NOTE — Anesthesia Postprocedure Evaluation (Signed)
Anesthesia Post Note  Patient: AKIERA ALLBAUGH  Procedure(s) Performed: XI ROBOTIC GASTRIC SLEEVE RESECTION;HIATAL HERNIA REPAIR UPPER GI ENDOSCOPY     Patient location during evaluation: PACU Anesthesia Type: General Level of consciousness: awake and alert Pain management: pain level controlled Vital Signs Assessment: post-procedure vital signs reviewed and stable Respiratory status: spontaneous breathing, nonlabored ventilation, respiratory function stable and patient connected to nasal cannula oxygen Cardiovascular status: blood pressure returned to baseline and stable Postop Assessment: no apparent nausea or vomiting Anesthetic complications: no   No notable events documented.  Last Vitals:  Vitals:   10/10/21 0157 10/10/21 0537  BP: 138/73 134/68  Pulse: 75 70  Resp: 18 18  Temp: 36.7 C 37.2 C  SpO2: 99% 100%    Last Pain:  Vitals:   10/10/21 0537  TempSrc: Oral  PainSc:                  Cougar Imel L Kaleo Condrey

## 2021-10-10 NOTE — Progress Notes (Signed)
Patient alert and oriented, Post op day 1.  Provided support and encouragement.  Encouraged pulmonary toilet, ambulation and small sips of liquids.  All questions answered.  Will continue to monitor. 

## 2021-10-10 NOTE — Discharge Summary (Signed)
Physician Discharge Summary  [name] NOB:[id number] DOB: 1961/08/11 DOA: 10/09/2021  PCP: Fayrene Helper, MD  Admit date: 10/09/2021 Discharge date: 10/10/2021  Recommendations for Outpatient Follow-up:    Follow-up Information     Greer Pickerel, MD. Go on 11/08/2021.   Specialty: General Surgery Why: at 3:45pm.  Please arrive 15 minutes prior to your appointment time.  Thank you. Contact information: [address] [place] [address] [phone]         Carlena Hurl, PA-C. Go on 12/05/2021.   Specialty: General Surgery Why: at 9:45am for Dr. Redmond Pulling.  Please arrive 15 minutes prior to your appointment time. Thank you. Contact information: [place] [address] [phone]                Discharge Diagnoses:  Active Problems:   S/P robotic sleeve gastrectomy with sliding hiatal hernia repair Severe obesity (BMI 48)  Hypertension, essential  Elevated LDL cholesterol level  Primary osteoarthritis of left knee   Surgical Procedure: robotic sleeve gastrectomy with sliding hiatal hernia repair, upper endoscopy  Discharge Condition: Good Disposition: Home  Diet recommendation: Postoperative sleeve gastrectomy diet (liquids only)  Filed Weights   10/09/21 0939  Weight: 134.7 kg     Hospital Course:  The patient was admitted for a planned robotic sleeve gastrectomy. Please see operative note. Preoperatively the patient was given 5000 units of subcutaneous heparin for DVT prophylaxis. Postoperative prophylactic Lovenox dosing was started on the evening of postoperative day 0. ERAS protocol was used. On the evening of postoperative day 0, the patient was started on water and ice chips. On postoperative day 1 the patient had no fever or tachycardia and was tolerating water in their diet was gradually advanced throughout the day. The patient was ambulating without difficulty. Their vital signs are stable without fever or  tachycardia. Their hemoglobin had remained stable.  The patient had received discharge instructions and counseling. They were deemed stable for discharge and had met discharge criteria  BP 132/76   Pulse 71   Temp 98.6 F (37 C) (Oral)   Resp 18   Ht 5' 6"  (1.676 m)   Wt 134.7 kg   LMP 10/19/2011   SpO2 100%   BMI 47.94 kg/m   Gen: alert, NAD, non-toxic appearing Pupils: equal, no scleral icterus Pulm: Lungs clear to auscultation, symmetric chest rise CV: regular rate and rhythm Abd: soft, mild approp tender, nondistended.  No cellulitis. No incisional hernia Ext: no edema, no calf tenderness Skin: no rash, no jaundice   Discharge Instructions  Discharge Instructions     Ambulate hourly while awake   Complete by: As directed    Call MD for:  difficulty breathing, headache or visual disturbances   Complete by: As directed    Call MD for:  persistant dizziness or light-headedness   Complete by: As directed    Call MD for:  persistant nausea and vomiting   Complete by: As directed    Call MD for:  redness, tenderness, or signs of infection (pain, swelling, redness, odor or green/yellow discharge around incision site)   Complete by: As directed    Call MD for:  severe uncontrolled pain   Complete by: As directed    Call MD for:  temperature >101 F   Complete by: As directed    Diet bariatric full liquid   Complete by: As directed    Discharge instructions   Complete by: As directed  See bariatric discharge instructions   Incentive spirometry   Complete by: As directed    Perform hourly while awake      Allergies as of 10/10/2021       Reactions   Sulfamethoxazole-trimethoprim Nausea Only        Medication List     STOP taking these medications    amoxicillin 500 MG tablet Commonly known as: AMOXIL   clotrimazole-betamethasone cream Commonly known as: LOTRISONE       TAKE these medications    acetaminophen 500 MG tablet Commonly known as:  TYLENOL Take 2 tablets (1,000 mg total) by mouth every 8 (eight) hours for 5 days.   amLODipine 10 MG tablet Commonly known as: NORVASC Take 1 tablet by mouth once daily Notes to patient: Monitor Blood Pressure Daily and keep a log for primary care physician.  You may need to make changes to your medications with rapid weight loss.     Biotin 10000 MCG Tabs Take 10,000 mcg by mouth daily.   cetirizine 10 MG tablet Commonly known as: ZYRTEC Take 10 mg by mouth daily as needed for allergies.   fluticasone 50 MCG/ACT nasal spray Commonly known as: Flonase Place 2 sprays into both nostrils daily.   gabapentin 100 MG capsule Commonly known as: NEURONTIN Take 2 capsules (200 mg total) by mouth every 12 (twelve) hours.   MAGNESIUM PO Take 1 tablet by mouth daily.   multivitamin tablet Take 1 tablet by mouth daily.   omega-3 acid ethyl esters 1 g capsule Commonly known as: LOVAZA Take 1 g by mouth daily.   ondansetron 4 MG disintegrating tablet Commonly known as: ZOFRAN-ODT Take 1 tablet (4 mg total) by mouth every 6 (six) hours as needed for nausea or vomiting.   oxyCODONE 5 MG immediate release tablet Commonly known as: Oxy IR/ROXICODONE Take 1 tablet (5 mg total) by mouth every 6 (six) hours as needed for severe pain.   pantoprazole 40 MG tablet Commonly known as: PROTONIX Take 1 tablet (40 mg total) by mouth daily.   vitamin C 500 MG tablet Commonly known as: ASCORBIC ACID Take 500 mg by mouth daily.   Vitamin D3 10 MCG (400 UNIT) Caps Take 400 Units by mouth daily.   zinc gluconate 50 MG tablet Take 50 mg by mouth daily.        Follow-up Information     Greer Pickerel, MD. Go on 11/08/2021.   Specialty: General Surgery Why: at 3:45pm.  Please arrive 15 minutes prior to your appointment time.  Thank you. Contact information: [address] [place] [address] [phone]         Carlena Hurl, PA-C. Go on 12/05/2021.   Specialty: General  Surgery Why: at 9:45am for Dr. Redmond Pulling.  Please arrive 15 minutes prior to your appointment time. Thank you. Contact information: [address] [place] [address] [phone]                  The results of significant diagnostics from this hospitalization (including imaging, microbiology, ancillary and laboratory) are listed below for reference.    Significant Diagnostic Studies: No results found.  Labs: Basic Metabolic Panel: Recent Labs  Lab 10/05/21 0838 10/10/21 0422  NA 140 139  K 3.9 4.4  CL 104 104  CO2 27 30  GLUCOSE 100* 140*  BUN 21* 11  CREATININE 0.77 0.75  CALCIUM 9.4 9.5   Liver Function Tests: Recent Labs  Lab 10/05/21 [phone]  10/10/21 0422  AST 20 32  ALT 20 29  ALKPHOS 66 63  BILITOT 0.5 0.5  PROT 7.6 7.1  ALBUMIN 3.8 3.5    CBC: Recent Labs  Lab 10/05/21 0838 10/09/21 1502 10/10/21 0422  WBC 4.4  --  9.1  NEUTROABS 2.4  --  6.7  HGB 12.1 11.6* 11.3*  HCT 38.6 37.5 35.7*  MCV 95.1  --  94.9  PLT 332  --  296    CBG: No results for input(s): GLUCAP in the last 168 hours.  Active Problems:   S/P laparoscopic sleeve gastrectomy   Time coordinating discharge: 15 min  Signed:  Gayland Curry, MD [facility], [place] [phone] 10/10/2021, 12:18 PM

## 2021-10-10 NOTE — Discharge Instructions (Signed)

## 2021-10-10 NOTE — Progress Notes (Signed)
Reviewed written d/c instructions w pt and all questions answered. She verbalized understanding. D/C per w/c w all belongings in stable condition. 

## 2021-10-10 NOTE — Progress Notes (Signed)
24hr fluid recall prior to discharge: 932mL.  Per dehydration protocol, will call pt to f/u within one week post op.

## 2021-10-11 ENCOUNTER — Telehealth (HOSPITAL_COMMUNITY): Payer: Self-pay | Admitting: *Deleted

## 2021-10-11 NOTE — Telephone Encounter (Signed)
1.  Tell me about your pain and pain management? Pt denies any pain.  2.  Let's talk about fluid intake.  How much total fluid are you taking in? Pt states that she is working to meet goal of 64 oz of fluid today.  Pt has consumed one protein shake and Gatorade zero and broth.  Pt plans to drink rest of protein and water for the remainder of the day to meet goal. Pt instructed to assess status and suggestions daily utilizing Hydration Action Plan on discharge folder and to call [facility] if in the "red zone".   3.  How much protein have you taken in the last 2 days? Pt states that she is working to meet goal of goal of 60g of protein today.  Pt has already consumed one protein shake.  Pt plans to drink remainder of protein throughout the rest of the day to meet goal.  4.  Have you had nausea?  Tell me about when have experienced nausea and what you did to help? Pt denies nausea.   5.  Has the frequency or color changed with your urine? Pt states that she is urinating "fine" with no changes in frequency or urgency.     6.  Tell me what your incisions look like? "Incisions look fine". Pt denies a fever, chills.  Pt states incisions are not swollen, open, or draining.  Pt encouraged to call [facility] if incisions change.   7.  Have you been passing gas? BM? Pt states that she has not had a BM, but is passing gas.  Pt instructed to take either Miralax or MoM as instructed per "Gastric Bypass/Sleeve Discharge Home Care Instructions".  Pt to call surgeon's office if not able to have BM with medication.   8.  If a problem or question were to arise who would you call?  Do you know contact numbers for [facility], [facility], and [facility]? Pt denies dehydration symptoms.  Pt can describe s/sx of dehydration.  Pt knows to call [facility] for surgical, [facility] for nutrition, and [facility] for non-urgent questions or concerns.   9.  How has the walking going? Pt states she is walking around and able to be active without difficulty.   10. Are you  still using your incentive spirometer?  If so, how often? Pt states that she has used her I.S. three times today. Pt encouraged to use incentive spirometer, at least 10x every hour while awake until she sees the surgeon.  11.  How are your vitamins and calcium going?  How are you taking them? Pt states that she is taking her supplements and vitamins without difficulty.  Reminded patient that the first 30 days post-operatively are important for successful recovery.  Practice good hand hygiene, wearing a mask when appropriate (since optional in most places), and minimizing exposure to people who live outside of the home, especially if they are exhibiting any respiratory, GI, or illness-like symptoms.

## 2021-10-16 ENCOUNTER — Telehealth: Payer: Self-pay

## 2021-10-16 NOTE — Telephone Encounter (Signed)
Transition Care Management Follow-up Telephone Call Date of discharge and from where: 10/10/2021  [facility] How have you been since you were released from the hospital? " Doing pretty good" Any questions or concerns? No  Items Reviewed: Did the pt receive and understand the discharge instructions provided? Yes  Medications obtained and verified? Yes  Other? No  Any new allergies since your discharge? No  Dietary orders reviewed? Yes Do you have support at home? Yes   Home Care and Equipment/Supplies: Were home health services ordered? no If so, what is the name of the agency?  Has the agency set up a time to come to the patient's home? not applicable Were any new equipment or medical supplies ordered?  No What is the name of the medical supply agency?  Were you able to get the supplies/equipment? not applicable Do you have any questions related to the use of the equipment or supplies? No  Functional Questionnaire: (I = Independent and D = Dependent) ADLs: I  Bathing/Dressing- I  Meal Prep- I  Eating- I  Maintaining continence- I  Transferring/Ambulation- I  Managing Meds- I  Follow up appointments reviewed:  PCP Hospital f/u appt confirmed? Yes  Scheduled to see PCP on 10/31/2021  Specialist Hospital f/u appt confirmed? Yes  Scheduled to see surgeon on 11/08/2021  Are transportation arrangements needed? No  If their condition worsens, is the pt aware to call PCP or go to the Emergency Dept.? Yes Was the patient provided with contact information for the PCP's office or ED? Yes Was to pt encouraged to call back with questions or concerns? Yes  Tomasa Rand, RN, BSN, CEN [facility] [employer] [phone]

## 2021-10-18 ENCOUNTER — Other Ambulatory Visit: Payer: Self-pay

## 2021-10-23 ENCOUNTER — Other Ambulatory Visit: Payer: Self-pay

## 2021-10-23 ENCOUNTER — Encounter: Payer: BC Managed Care – PPO | Attending: General Surgery | Admitting: Skilled Nursing Facility1

## 2021-10-24 NOTE — Progress Notes (Signed)
2 Week Post-Operative Nutrition Class   Patient was seen on 10/23/2021 for Post-Operative Nutrition education at [facility].   Surgery date: 10/09/2021 Surgery type: sleeve Start weight at [facility]: 299.5 Weight today: pt arrived too late Bowel Habits: Every day to every other day no complaints      The following the learning objectives were met by the patient during this course: Identifies Phase 3 (Soft, High Proteins) Dietary Goals and will begin from 2 weeks post-operatively to 2 months post-operatively Identifies appropriate sources of fluids and proteins  Identifies appropriate fat sources and healthy verses unhealthy fat types   States protein recommendations and appropriate sources post-operatively Identifies the need for appropriate texture modifications, mastication, and bite sizes when consuming solids Identifies appropriate fat consumption and sources Identifies appropriate multivitamin and calcium sources post-operatively Describes the need for physical activity post-operatively and will follow MD recommendations States when to call healthcare provider regarding medication questions or post-operative complications   Handouts given during class include: Phase 3A: Soft, High Protein Diet Handout Phase 3 High Protein Meals Healthy Fats   Follow-Up Plan: Patient will follow-up at [facility] in 6 weeks for 2 month post-op nutrition visit for diet advancement per MD.

## 2021-10-29 ENCOUNTER — Telehealth: Payer: Self-pay | Admitting: Skilled Nursing Facility1

## 2021-10-29 NOTE — Telephone Encounter (Signed)
RD called pt to verify fluid intake once starting soft, solid proteins 2 week post-bariatric surgery.   Daily Fluid intake: 64+ oz Daily Protein intake: 60 g Bowel Habits: every day to every other day with some strain  Concerns/issues:   None identified

## 2021-10-31 ENCOUNTER — Encounter: Payer: Self-pay | Admitting: Family Medicine

## 2021-10-31 ENCOUNTER — Other Ambulatory Visit: Payer: Self-pay

## 2021-10-31 ENCOUNTER — Ambulatory Visit (INDEPENDENT_AMBULATORY_CARE_PROVIDER_SITE_OTHER): Payer: BC Managed Care – PPO | Admitting: Family Medicine

## 2021-10-31 VITALS — BP 133/69 | HR 76 | Resp 15 | Ht 66.0 in | Wt 276.0 lb

## 2021-10-31 DIAGNOSIS — I10 Essential (primary) hypertension: Secondary | ICD-10-CM

## 2021-10-31 DIAGNOSIS — Z23 Encounter for immunization: Secondary | ICD-10-CM

## 2021-10-31 NOTE — Patient Instructions (Signed)
F/U in 10 weeks, call if you need me before, re evaluate blood pressure  Shingrix #2  today  Covid booster past due  Excellent BP no med change  Congrats on excellent weight loss, keep it up, thankful surgery is a success  Thanks for choosing [facility], we consider it a privelige to serve you.

## 2021-11-04 ENCOUNTER — Encounter: Payer: Self-pay | Admitting: Family Medicine

## 2021-11-04 NOTE — Assessment & Plan Note (Signed)
Controlled, no change in medication DASH diet and commitment to daily physical activity for a minimum of 30 minutes discussed and encouraged, as a part of hypertension management. The importance of attaining a healthy weight is also discussed.  BP/Weight 10/31/2021 10/23/2021 10/10/2021 10/09/2021 10/05/2021 40/68/4033 04/10/3173  Systolic BP 099 - 278 - 004 - -  Diastolic BP 69 - 76 - 87 - -  Wt. (Lbs) 276 - - 297 294 302.7 299.5  BMI 44.55 - - 47.94 47.45 48.86 48.34

## 2021-11-04 NOTE — Assessment & Plan Note (Signed)
Improved. Pt applauded on succesful weight loss through lifestyle change, and encouraged to continue same. Weight loss goal set for the next several months.  

## 2021-11-04 NOTE — Progress Notes (Signed)
   [name]     MRN: [id number]      DOB: [birth year]   HPI [name] is here for follow up and re-evaluation of chronic medical conditions, medication management and review of any available recent lab and radiology data.  Preventive health is updated, specifically  Cancer screening and Immunization.   No complication with bariatric surgery and feels better with weight loss. The PT denies any adverse reactions to current medications since the last visit.  There are no new concerns.  There are no specific complaints   ROS Denies recent fever or chills. Denies sinus pressure, nasal congestion, ear pain or sore throat. Denies chest congestion, productive cough or wheezing. Denies chest pains, palpitations and leg swelling Denies abdominal pain, nausea, vomiting,diarrhea or constipation.   Denies dysuria, frequency, hesitancy or incontinence. Denies joint pain, swelling and limitation in mobility. Denies headaches, seizures, numbness, or tingling. Denies depression, anxiety or insomnia. Denies skin break down or rash.   PE  BP 133/69   Pulse 76   Resp 15   Ht 5\' 6"  (1.676 m)   Wt 276 lb (125.2 kg)   LMP 10/19/2011   SpO2 97%   BMI 44.55 kg/m   Patient alert and oriented and in no cardiopulmonary distress.  HEENT: No facial asymmetry, EOMI,     Neck supple .  Chest: Clear to auscultation bilaterally.  CVS: S1, S2 no murmurs, no S3.Regular rate.  ABD: Soft non tender.   Ext: No edema  MS: Adequate ROM spine, shoulders, hips and knees.  Skin: Intact, no ulcerations or rash noted.  Psych: Good eye contact, normal affect. Memory intact not anxious or depressed appearing.  CNS: CN 2-12 intact, power,  normal throughout.no focal deficits noted.   Assessment & Plan  HTN (hypertension) Controlled, no change in medication DASH diet and commitment to daily physical activity for a minimum of 30 minutes discussed and encouraged, as a part of hypertension management. The  importance of attaining a healthy weight is also discussed.  BP/Weight 10/31/2021 10/23/2021 10/10/2021 10/09/2021 10/05/2021 06/07/1600 0/08/3234  Systolic BP 573 - 220 - 254 - -  Diastolic BP 69 - 76 - 87 - -  Wt. (Lbs) 276 - - 297 294 302.7 299.5  BMI 44.55 - - 47.94 47.45 48.86 48.34       Morbid obesity (HCC) Improved. Pt applauded on succesful weight loss through lifestyle change, and encouraged to continue same. Weight loss goal set for the next several months.

## 2021-11-25 ENCOUNTER — Other Ambulatory Visit: Payer: Self-pay | Admitting: Family Medicine

## 2021-12-05 ENCOUNTER — Other Ambulatory Visit: Payer: Self-pay

## 2021-12-05 ENCOUNTER — Encounter: Payer: BC Managed Care – PPO | Attending: General Surgery | Admitting: Skilled Nursing Facility1

## 2021-12-05 NOTE — Progress Notes (Signed)
Bariatric Nutrition Follow-Up Visit Medical Nutrition Therapy   NUTRITION ASSESSMENT    Surgery date: 10/09/2021 Surgery type: sleeve Start weight at [facility]: 299.5 Weight today: 260.2 pounds   Body Composition Scale 12/05/2021  Weight  lbs 260.2  Total Body Fat  % 46.3     Visceral Fat 18  Fat-Free Mass  % 53.6     Total Body Water  % 41.3     Muscle-Mass  lbs 31.8  BMI 42  Body Fat Displacement ---        Torso  lbs 74.9        Left Leg  lbs 14.9        Right Leg  lbs 14.9        Left Arm  lbs 7.4        Right Arm  lbs 7.4   Clinical  Medical hx: HTN Medications: see list Labs: none updated in EMR Notable signs/symptoms: some knee pain Any previous deficiencies? No   Lifestyle & Dietary Hx  Pt states she burps a lot after eating.  Pt state she chews well and takes her time when eating and it will start after her first bite. Pt states she takes protonix daily.  Pt states she had diarrhea at one point so she started eating mashed potatoes so she could eat something.  Pt states when she is not wanting to eat meat she will eat mashed potatoes because they go down well: Dietitian discussed this with pt.  Pt states she typically adds oil to her vegetables: dietitian advised pt to measure out her oil only using 1 T for the dish.   Estimated daily fluid intake: 35-48 oz Estimated daily protein intake: 60 g Supplements: multi and calcium Current average weekly physical activity: gym 3 times a week 45 minutes    24-Hr Dietary Recall First Meal: boiled egg or protein shake Snack:   Second Meal: [place] or ham + cheese stick Snack: cheese stick  Third Meal: fish or chicken or shrimp + green beans Snack: sugar free jello Beverages: water, gatorade zero, water + flavorings   Post-Op Goals/ Signs/ Symptoms Using straws: no Drinking while eating: no Chewing/swallowing difficulties: no Changes in vision: no Changes to mood/headaches: no Hair loss/changes to skin/nails:  no Difficulty focusing/concentrating: no Sweating: no Limb weakness: no Dizziness/lightheadedness: no Palpitations: no  Carbonated/caffeinated beverages: no N/V/D/C/Gas: no Abdominal pain: no Dumping syndrome: no    NUTRITION DIAGNOSIS  Overweight/obesity ([place]-3.3) related to past poor dietary habits and physical inactivity as evidenced by completed bariatric surgery and following dietary guidelines for continued weight loss and healthy nutrition status.     NUTRITION INTERVENTION Nutrition counseling (C-1) and education (E-2) to facilitate bariatric surgery goals, including: Diet advancement to the next phase (phase 4) now including non starchy vegetables  The importance of consuming adequate calories as well as certain nutrients daily due to the body's need for essential vitamins, minerals, and fats The importance of daily physical activity and to reach a goal of at least 150 minutes of moderate to vigorous physical activity weekly (or as directed by their physician) due to benefits such as increased musculature and improved lab values The importance of intuitive eating specifically learning hunger-satiety cues and understanding the importance of learning a new body: The importance of mindful eating to avoid grazing behaviors   Goals: Focus on fluid only worry about what that fluid is later when reaching those goals -Eat non-starchy vegetables 2 times a day 7 days a week -  Start out with soft cooked vegetables today and tomorrow; if tolerated begin to eat raw vegetables or cooked including salads -Eat your 3 ounces of protein first then start in on your non-starchy vegetables; once you understand how much of your meal leads to satisfaction and not full while still eating 3 ounces of protein and non-starchy vegetables you can eat them in any order  -Continue to aim for 30 minutes of activity at least 5 times a week -Do NOT cook with/add to your food: alfredo sauce, cheese sauce, barbeque  sauce, ketchup, fat back, butter, bacon grease, grease, Crisco, OR SUGAR -When not wanting to eat meat instead of eating mashed potatoes eat plant based protein  Handouts Provided Include  Phase 4  Learning Style & Readiness for Change Teaching method utilized: Visual & Auditory  Demonstrated degree of understanding via: Teach Back  Readiness Level: action Barriers to learning/adherence to lifestyle change: none identified   RD's Notes for Next Visit Assess adherence to pt chosen goals    MONITORING & EVALUATION Dietary intake, weekly physical activity, body weight  Next Steps Patient is to follow-up in 3-4 months

## 2022-01-04 ENCOUNTER — Telehealth: Payer: Self-pay | Admitting: Family Medicine

## 2022-01-04 NOTE — Telephone Encounter (Signed)
Pt returning phone call   Pt states that she is in class and caller can leave detailed message

## 2022-01-09 ENCOUNTER — Encounter: Payer: Self-pay | Admitting: Family Medicine

## 2022-01-09 ENCOUNTER — Ambulatory Visit: Payer: BC Managed Care – PPO | Admitting: Family Medicine

## 2022-01-09 ENCOUNTER — Other Ambulatory Visit: Payer: Self-pay

## 2022-01-09 VITALS — BP 138/80 | HR 70 | Ht 66.0 in | Wt 250.0 lb

## 2022-01-09 DIAGNOSIS — E785 Hyperlipidemia, unspecified: Secondary | ICD-10-CM

## 2022-01-09 DIAGNOSIS — I10 Essential (primary) hypertension: Secondary | ICD-10-CM | POA: Diagnosis not present

## 2022-01-09 DIAGNOSIS — E559 Vitamin D deficiency, unspecified: Secondary | ICD-10-CM

## 2022-01-09 MED ORDER — FLUTICASONE PROPIONATE 50 MCG/ACT NA SUSP: 2.0000 | Freq: Every day | NASAL | 6 refills | Status: DC

## 2022-01-09 MED ORDER — FLUTICASONE PROPIONATE 50 MCG/ACT NA SUSP: 2.0000 | Freq: Every day | NASAL | 5 refills | Status: DC

## 2022-01-09 NOTE — Patient Instructions (Addendum)
F/U end May, call if you need me sooner  CONGRATS, HAPPY for YOU, keep it up!!!  No med change  Fasting lipid, cmp an dEGFR, TSG andf vit D abnd CBC 5 days before next visit

## 2022-01-13 ENCOUNTER — Encounter: Payer: Self-pay | Admitting: Family Medicine

## 2022-01-13 NOTE — Progress Notes (Signed)
° °  [name]     MRN: [id number]      DOB: 10/17/1961   HPI [name] is here for follow up and re-evaluation of chronic medical conditions, medication management and review of any available recent lab and radiology data.  Preventive health is updated, specifically  Cancer screening and Immunization.   Questions or concerns regarding consultations or procedures which the PT has had in the interim are  addressed. The PT denies any adverse reactions to current medications since the last visit.  There are no new concerns.  There are no specific complaints   ROS Denies recent fever or chills. Denies sinus pressure, nasal congestion, ear pain or sore throat. Denies chest congestion, productive cough or wheezing. Denies chest pains, palpitations and leg swelling Denies abdominal pain, nausea, vomiting,diarrhea or constipation.   Denies dysuria, frequency, hesitancy or incontinence. Denies joint pain, swelling and limitation in mobility. Denies headaches, seizures, numbness, or tingling. Denies depression, anxiety or insomnia. Denies skin break down or rash.   PE  BP 138/80    Pulse 70    Ht 5\' 6"  (1.676 m)    Wt 250 lb (113.4 kg)    LMP 10/19/2011    SpO2 96%    BMI 40.35 kg/m   Patient alert and oriented and in no cardiopulmonary distress.  HEENT: No facial asymmetry, EOMI,     Neck supple .  Chest: Clear to auscultation bilaterally.  CVS: S1, S2 no murmurs, no S3.Regular rate.  ABD: Soft non tender.   Ext: No edema  MS: Adequate ROM spine, shoulders, hips and knees.  Skin: Intact, no ulcerations or rash noted.  Psych: Good eye contact, normal affect. Memory intact not anxious or depressed appearing.  CNS: CN 2-12 intact, power,  normal throughout.no focal deficits noted.   Assessment & Plan  HTN (hypertension) Controlled, no change in medication DASH diet and commitment to daily physical activity for a minimum of 30 minutes discussed and encouraged, as a part of  hypertension management. The importance of attaining a healthy weight is also discussed.  BP/Weight 01/09/2022 12/05/2021 10/31/2021 10/23/2021 10/10/2021 10/09/2021 42/59/5638  Systolic BP 756 - 433 - 295 - 188  Diastolic BP 80 - 69 - 76 - 87  Wt. (Lbs) 250 260.2 276 - - 297 294  BMI 40.35 42 44.55 - - 47.94 47.45

## 2022-01-13 NOTE — Assessment & Plan Note (Signed)
°  Patient re-educated about  the importance of commitment to a  minimum of 150 minutes of exercise per week as able.  The importance of healthy food choices with portion control discussed, as well as eating regularly and within a 12 hour window most days. The need to choose "clean , green" food 50 to 75% of the time is discussed, as well as to make water the primary drink and set a goal of 64 ounces water daily.    Weight /BMI 01/09/2022 12/05/2021 10/31/2021  WEIGHT 250 lb 260 lb 3.2 oz 276 lb  HEIGHT 5\' 6"  5\' 6"  5\' 6"   BMI 40.35 kg/m2 42 kg/m2 44.55 kg/m2   improved

## 2022-01-13 NOTE — Assessment & Plan Note (Signed)
Controlled, no change in medication DASH diet and commitment to daily physical activity for a minimum of 30 minutes discussed and encouraged, as a part of hypertension management. The importance of attaining a healthy weight is also discussed.  BP/Weight 01/09/2022 12/05/2021 10/31/2021 10/23/2021 10/10/2021 10/09/2021 01/65/8006  Systolic BP 349 - 494 - 473 - 958  Diastolic BP 80 - 69 - 76 - 87  Wt. (Lbs) 250 260.2 276 - - 297 294  BMI 40.35 42 44.55 - - 47.94 47.45

## 2022-03-18 ENCOUNTER — Other Ambulatory Visit: Payer: Self-pay | Admitting: Family Medicine

## 2022-03-19 ENCOUNTER — Encounter: Payer: BC Managed Care – PPO | Attending: General Surgery | Admitting: Skilled Nursing Facility1

## 2022-03-19 DIAGNOSIS — Z713 Dietary counseling and surveillance: Secondary | ICD-10-CM | POA: Diagnosis not present

## 2022-03-19 DIAGNOSIS — E7800 Pure hypercholesterolemia, unspecified: Secondary | ICD-10-CM | POA: Insufficient documentation

## 2022-03-19 DIAGNOSIS — I10 Essential (primary) hypertension: Secondary | ICD-10-CM | POA: Insufficient documentation

## 2022-03-19 DIAGNOSIS — Z6837 Body mass index (BMI) 37.0-37.9, adult: Secondary | ICD-10-CM | POA: Insufficient documentation

## 2022-03-20 NOTE — Progress Notes (Signed)
Follow-up visit:  Post-Operative 03/19/2022 Surgery ? ?Medical Nutrition Therapy:  Appt start time: 6:00pm end time:  7:00pm ? ?Primary concerns today: Post-operative Bariatric Surgery Nutrition Management 6 Month Post-Op Class ? ?Surgery date: 10/09/2021 ?Surgery type: sleeve ?Start weight at [facility]: 299.5 ?Weight today: 226.0 pounds  ? ?Body Composition Scale 12/05/2021 03/19/2022  ?Weight  lbs 260.2 226.0  ?Total Body Fat  % 46.3 43.6  ?   Visceral Fat 18 15  ?Fat-Free Mass  % 53.6 56.3  ?   Total Body Water  % 41.3 42.6  ?   Muscle-Mass  lbs 31.8 30.6  ?BMI 42 37.3  ?Body Fat Displacement ---   ?      Torso  lbs 74.9 61.0  ?      Left Leg  lbs 14.9 12.2  ?      Right Leg  lbs 14.9 12.2  ?      Left Arm  lbs 7.4 6.1  ?      Right Arm  lbs 7.4 6.1  ? ? ? ?Information Reviewed/ Discussed During Appointment: ?-Review of composition scale numbers ?-Fluid requirements (64-100 ounces) ?-Protein requirements (60-80g) ?-Strategies for tolerating diet ?-Advancement of diet to include Starchy vegetables ?-Barriers to inclusion of new foods ?-Inclusion of appropriate multivitamin and calcium supplements  ?-Exercise recommendations  ? ?Fluid intake: adequate  ? ?Medications: See List ?Supplementation: appropriate  ? ?Using straws: no ?Drinking while eating: no ?Having you been chewing well: yes ?Chewing/swallowing difficulties: no ?Changes in vision: no ?Changes to mood/headaches: no ?Hair loss/Cahnges to skin/Changes to nails: no ?Any difficulty focusing or concentrating: no ?Sweating: no ?Dizziness/Lightheaded: no ?Palpitations: no  ?Carbonated beverages: no ?N/V/D/C/GAS: no ?Abdominal Pain: no ?Dumping syndrome: no ? ?Recent physical activity:  ADL's ? ?Progress Towards Goal(s):  In Progress ?Teaching method utilized: Visual & Auditory  ?Demonstrated degree of understanding via: Teach Back  ?Readiness Level: Action ?Barriers to learning/adherence to lifestyle change: none identified ? ?Handouts given during visit  include: ?Phase V diet Progression  ?Goals Sheet ?The Benefits of Exercise are endless..... ?Support Group Topics ? ? ? ?Teaching Method Utilized:  ?Visual ?Auditory ?Hands on ? ?Demonstrated degree of understanding via:  Teach Back  ? ?Monitoring/Evaluation:  Dietary intake, exercise, and body weight. Follow up in 3 months for 9 month post-op visit.  ?

## 2022-04-05 ENCOUNTER — Encounter: Payer: Self-pay | Admitting: Internal Medicine

## 2022-04-05 DIAGNOSIS — G4719 Other hypersomnia: Secondary | ICD-10-CM

## 2022-04-09 NOTE — Procedures (Signed)
? ?[facility]  ?Polysomnogram Report ?Part I ? ? ?   ?                                                           Phone: [phone] ?Fax: [phone] ? ?Patient Name: [name], [name]. Acquisition Number: [id number]  ?Date of Birth: 07/06/1959 Acquisition Date: 04/05/2022  ?Referring Physician: Matilde Haymaker, MD    ? ?History: The patient is a 61 year old female who was referred for evaluation of possible sleep apnea with snoring and sleepiness. Medical History: hypertension, high cholesterol. ? ?Medications: CoQ-10, fish oil, vitamin C, vitamin E, multivitamin, milk thistle, biotin, vitamin D3, losartan, oyster shell calcium, rosuvastatin. ? ?Procedure: This routine overnight polysomnogram was performed on the [facility] 5 using the standard diagnostic protocol. This included 6 channels of EEG, 2 channels of EOG, chin EMG, bilateral anterior tibialis EMG, nasal/oral thermistor, PTAF (nasal pressure transducer), chest and abdominal wall movements, EKG, and pulse oximetry. ? ?Description: The total recording time was 399.6 minutes. The total sleep time was 356.5 minutes. There were a total of 36.0 minutes of wakefulness after sleep onset for a slightly reducedsleep efficiency of 89.2%. The latency to sleep onset was shortat 7.1 minutes. The R sleep onset latency was within normal limits at 84.0 minutes. Sleep parameters, as a percentage of the total sleep time, demonstrated 1.0% of sleep was in N1 sleep, 49.9% N2, 14.3% N3 and 34.8% R sleep. There were a total of 59 arousals for an arousal index of 9.9 arousals per hour of sleep that was normal. ? ?Respiratory monitoring demonstrated nearly continuous mild to severe degree of snoring in all positions. There were 137 apneas and hypopneas for an Apnea Hypopnea Index of 23.1 apneas and hypopneas per hour of sleep. The REM related apnea hypopnea index was 61.9/hr of REM sleep compared to a NREM AHI of 2.3/hr. The Respiratory Disturbance Index, which includes 6  respiratory effort related arousals (RERAs), was 24.1 respiratory events per hour of sleep.  The average duration of the respiratory events was 29.6 seconds with a maximum duration of 96.5 seconds. The respiratory events occurred in all positions. The respiratory events were associated with peripheral oxygen desaturations on the average to 86%. The lowest oxygen desaturation associated with a respiratory event was 74%. Additionally, the baseline oxygen saturation during wakefulness was 94%, during NREM sleep averaged 96%, and during REM sleep averaged  93%. The total duration of oxygen < 90% was 23.5 minutes and <80% was 0.7 minutes. ? ?Cardiac monitoring- did not demonstrate transient cardiac decelerations associated with the apneas. There were no significant cardiac rhythm irregularities.  ? ?Periodic limb movement monitoring- did not demonstrate periodic limb movements.  ? ?Impression: ?This routine overnight polysomnogram demonstrated significant, REM-related  obstructive sleep apnea with an overall Apnea Hypopnea Index of 23.1 apneas and hypopneas per hour of sleep, which increased to 61.9 in REM sleep. The lowest desaturation was to 74%.   ? ?There was a slightly reduced sleep efficiency. findings would appear to be due to the obstructive sleep apnea.  ? ?Recommendations:  ?A CPAP titration would be recommended due to the severity of the sleep apnea. ?Additionally, would recommend weight loss in a patient with a BMI of 40.7 lb/in2 ? ? ? ? ?Allyne Gee, MD, FCCP ?  Diplomate ABMS-Pulmonary, Critical Care and Sleep Medicine  ?Electronically reviewed and digitally signed ? ? ?[facility] ?Polysomnogram Report ?Part II ? ?Phone: [phone] ?Fax: [phone] ? ?Patient last name Benson Neck Size 16.0 in. Acquisition [phone]  ?Patient first name Janice A. Weight 260.0 lbs. Started 04/05/2022 at 10:52:20 PM  ?Birth date 07/06/1959 Height 67.0 in. Stopped 04/06/2022 at 5:40:14 AM  ?Age 22 BMI 40.7 lb/in2  Duration 399.6  ?Study Type Adult      ?Report generated by Scotty Court. [place] Reviewed by: Richelle Ito. Saunders Glance, PhD, ABSM, FAASM ?Sleep Data: ?Lights Out: 10:58:44 PM Sleep Onset: 11:05:50 PM  ?Lights On: 5:38:20 AM Sleep Efficiency: 89.2 %  ?Total Recording Time: 399.6 min Sleep Latency (from Lights Off) 7.1 min  ?Total Sleep Time (TST): 356.5 min R Latency (from Sleep Onset): 84.0 min  ?Sleep Period Time: 391.0 min Total number of awakenings: 11  ?Wake during sleep: 34.5 min Wake After Sleep Onset (WASO): 36.0 min  ? ?Sleep Data:         Arousal Summary: ?Stage  ?Latency from lights out (min) Latency from sleep onset (min) Duration (min) % Total Sleep Time  ?Normal values  ?N 1 7.1 0.0 3.5 1.0 (5%)  ?N 2 8.1 1.0 178.0 49.9 (50%)  ?N 3 33.1 26.0 51.0 14.3 (20%)  ?R 91.1 84.0 124.0 34.8 (25%)  ? ? Number Index  ?Spontaneous 11 1.9  ?Apneas & Hypopneas 43 7.2  ?RERAs 6 1.0  ?     (Apneas & Hypopneas & RERAs)  (49) (8.2)  ?Limb Movement 0 0.0  ?Snore 10 1.7  ?TOTAL 70 11.8  ? ? ? ?Respiratory Data: ? CA OA MA Apnea Hypopnea* A+ H RERA Total  ?Number 7 37 0 44 93 137 6 143  ?Mean Dur (sec) 14.6 15.8 0.0 15.6 36.0 29.5 31.2 29.6  ?Max Dur (sec) 19.5 35.0 0.0 35.0 96.5 96.5 45.5 96.5  ?Total Dur (min) 1.7 9.7 0.0 11.4 55.9 67.3 3.1 70.4  ?% of TST 0.5 2.7 0.0 3.2 15.7 18.9 0.9 19.8  ?Index (#/h TST) 1.2 6.2 0.0 7.4 15.7 23.1 1.0 24.1  ?*Hypopneas scored based on 4% or greater desaturation. ? ?Sleep Stage:       ? REM NREM TST  ?AHI 61.9 2.3 23.1  ?RDI 62.9 3.4 24.1  ? ? ? ? ? ? ? ? ? ?Body Position Data: ? Sleep ?(min) TST ?(%) REM ?(min) NREM ?(min) CA ?(#) OA ?(#) MA ?(#) HYP ?(#) AHI ?(#/h) RERA ?(#) RDI ?(#/h) Desat ?(#)  ?Supine 148.5 41.65 78.0 70.5 0 25 0 59 33.9 2 34.7 97  ?Non-Supine 208.00 58.35 46.00 162.00 7.00 12.00 0.00 34.00 15.29 4.00 16.44 74.00  ?Left: 72.5 20.34 23.5 49.0 0 2 0 14 13.2 0 13.2 23  ?Right: 135.5 38.01 22.5 113.0 7 10 0 20 16.4 4 18.2 51  ?UP: 0.0 0.00 0.0 0.0 0 0 0 0  0.0 0 0.00 0  ?  ? ?Snoring: ?Total number of snoring episodes  94  ?Total time with snoring 109.2 min (30.6 % of sleep)  ? ?Oximetry Distribution: ?            WK REM NREM TOTAL  ?Average (%)   94 93 96 95  ?< 90% 0.1 23.3 0.1 23.5  ?< 80% 0.0 0.7 0.0 0.7  ?< 70% 0.0 0.0 0.0 0.0  ?# of Desaturations* 2 836 62 947  ?Desat Index (#/hour) 8.7 64.8 8.3 28.8  ?  Desat Max (%) '7 21 8 21  '$ ?Desat Max Dur (sec) 18.0 113.0 76.0 113.0  ?Approx Min O2 during sleep 74  ?Approx min O2 during a respiratory event 74  ?Was Oxygen added (Y/N) and final rate No:   0 LPM  ?*Desaturations based on 3% or greater drop from baseline. ? ? Cheyne Stokes Breathing: None Present  ? ?Heart Rate Summary:  ?Average Heart Rate During Sleep 72.6 bpm      ?Highest Heart Rate During Sleep (95th %) 81.0 bpm      ?Highest Heart Rate During Sleep 176 bpm (artifact)  ?Highest Heart Rate During Recording (TIB) 215 bpm (artifact)  ? ?Heart Rate Observations: ?Event Type # Events   ?Bradycardia 0 Lowest HR Scored: N/A  ?Sinus Tachycardia During Sleep 0 Highest HR Scored: N/A  ?Narrow Complex Tachycardia 0 Highest HR Scored: N/A  ?Wide Complex Tachycardia 0 Highest HR Scored: N/A  ?Asystole 0 Longest Pause: N/A  ?Atrial Fibrillation 0 Duration Longest Event: N/A  ?Other Arrythmias  No Type:   ? ?Periodic Limb Movement Data: (Primary legs unless otherwise noted) ?Total # Limb Movement 0 Limb Movement Index 0.0  ?Total # PLMS    PLMS Index     ?Total # PLMS Arousals    PLMS Arousal Index     ?Percentage Sleep Time with PLMS   min (   % sleep)  ?Mean Duration limb movements (secs)     ? ? ? ? ? ?

## 2022-04-30 ENCOUNTER — Ambulatory Visit: Payer: BC Managed Care – PPO | Admitting: Family Medicine

## 2022-05-02 ENCOUNTER — Ambulatory Visit: Payer: BC Managed Care – PPO | Admitting: Family Medicine

## 2022-05-28 IMAGING — DX DG CHEST 1V PORT
1 series · 1 of 1 positions shown · non-contrast
Comparison: June 23, 2017

CLINICAL DATA: Chest pain

EXAM:
PORTABLE CHEST 1 VIEW

[chest ap]
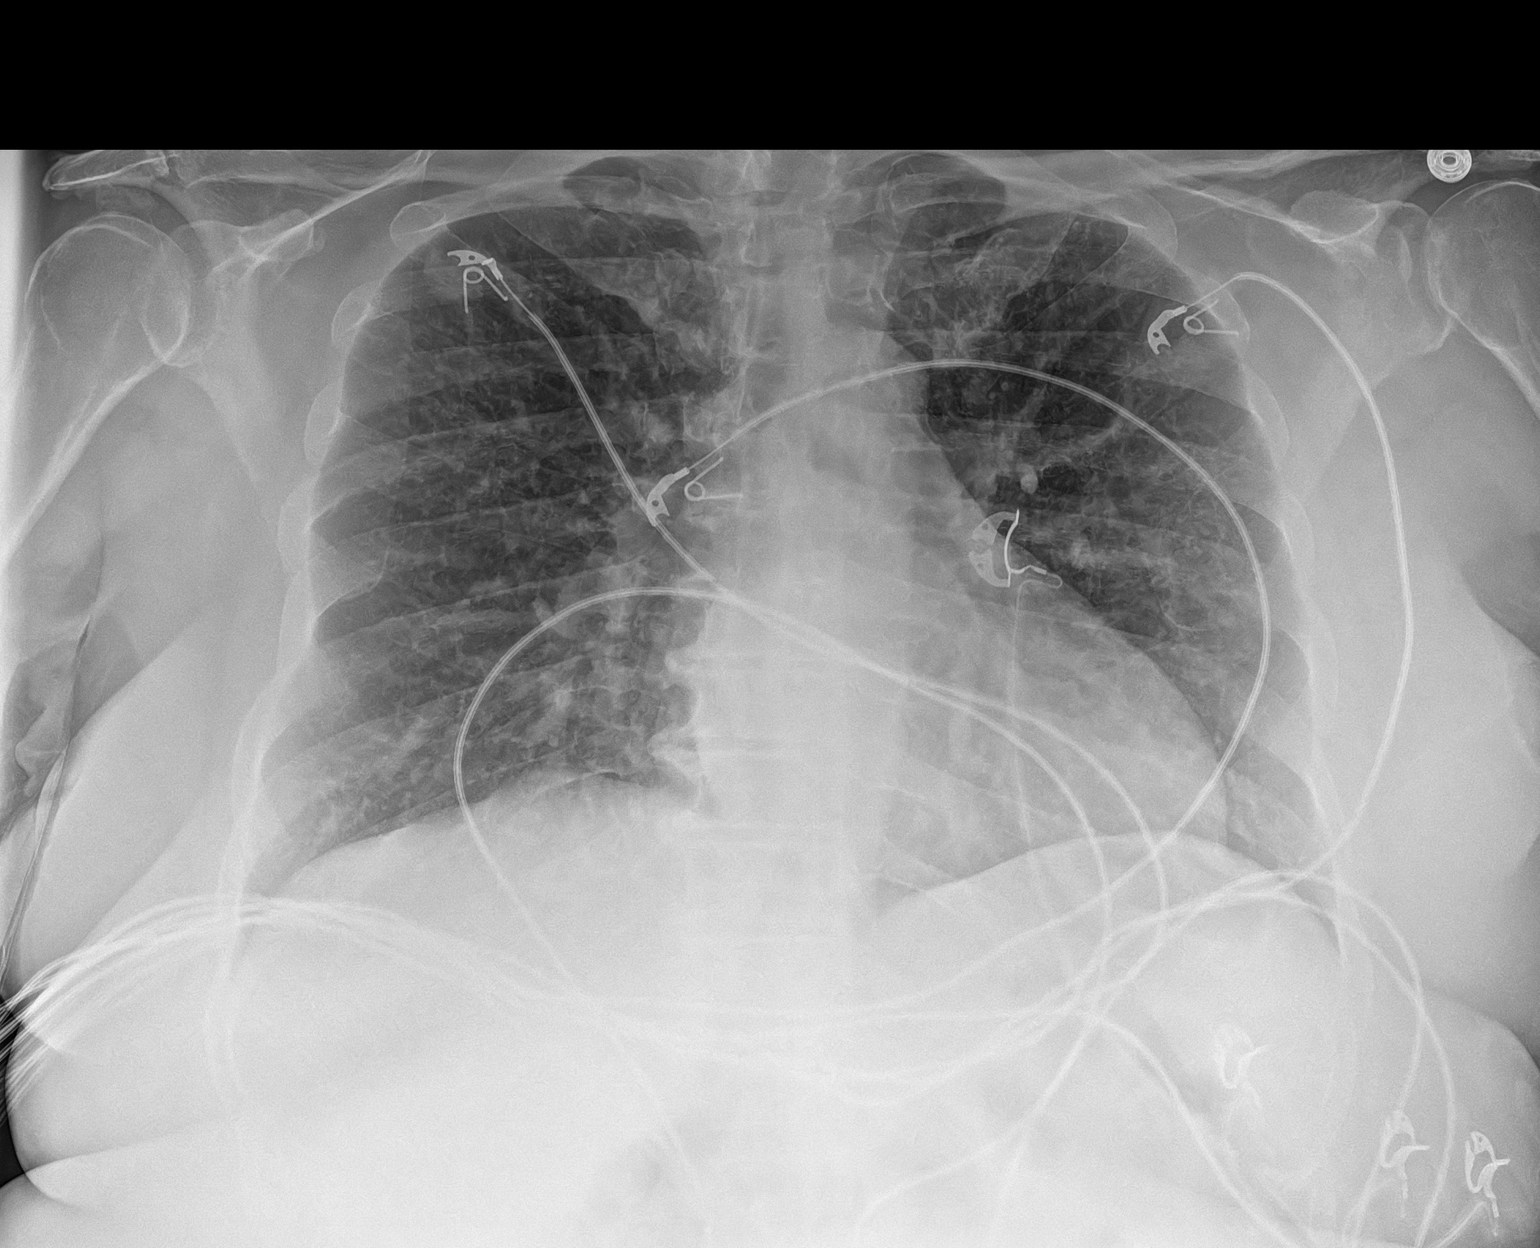

[1 of 1 positions shown; findings below may reference images not displayed]

FINDINGS: The heart size and mediastinal contours are within normal limits.
There is prominence of the central pulmonary vasculature. The
visualized skeletal structures are unremarkable.
IMPRESSION: Mild pulmonary vascular congestion.

## 2022-06-08 ENCOUNTER — Other Ambulatory Visit: Payer: Self-pay | Admitting: Family Medicine

## 2022-06-13 ENCOUNTER — Ambulatory Visit: Payer: BC Managed Care – PPO | Admitting: Skilled Nursing Facility1

## 2022-06-17 ENCOUNTER — Encounter: Payer: Self-pay | Admitting: Dietician

## 2022-06-17 ENCOUNTER — Encounter: Payer: BC Managed Care – PPO | Attending: General Surgery | Admitting: Dietician

## 2022-06-17 DIAGNOSIS — Z713 Dietary counseling and surveillance: Secondary | ICD-10-CM | POA: Diagnosis not present

## 2022-06-17 DIAGNOSIS — E669 Obesity, unspecified: Secondary | ICD-10-CM

## 2022-06-17 NOTE — Progress Notes (Signed)
Bariatric Nutrition Follow-Up Visit Medical Nutrition Therapy   NUTRITION ASSESSMENT  Surgery date: 10/09/2021 Surgery type: sleeve  Start weight at [facility]: 299.5 Weight today: 211.8 pounds   Body Composition Scale 12/05/2021 06/17/2022  Weight  lbs 260.2 211.8  Total Body Fat  % 46.3 41.4     Visceral Fat 18 13  Fat-Free Mass  % 53.6 58.5     Total Body Water  % 41.3 43.7     Muscle-Mass  lbs 31.8 30.9  BMI 42 34.1  Body Fat Displacement ---         Torso  lbs 74.9 54.3        Left Leg  lbs 14.9 10.8        Right Leg  lbs 14.9 10.8        Left Arm  lbs 7.4 5.4        Right Arm  lbs 7.4 5.4   Clinical  Medical hx: HTN Medications: see list Labs: none updated in EMR Notable signs/symptoms: some knee pain Any previous deficiencies? No   Lifestyle & Dietary Hx  Pt states she is trying to get the water in, stating she is drinking more in the summer. Pt states she can eat about 2-3 oz of meat at a time. Pt states ground beef is the only way she can eat beef. Pt states she eats many different non-starchy vegetables. 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 Pt states her knee is feeling better, stating she might have to have knee surgery, but now she states she is just getting injections, which seem to be working. Pt states she goes to the gym 4 times a week 60 minutes per visit (during the summer while not in school), stating she does cardio then resistance training on her legs.  Pt states she lost all her weight in her upper body/arms and trying to lose weight in her legs.  Dietitian recommended a balance of resistance workout of upper and lower body as well as her cardio workout, to maintain muscle mass through out the body.  Estimated  daily fluid intake: 50 oz Estimated daily protein intake: 60 g Supplements: multi and calcium Current average weekly physical activity: gym 4 times a week 60 minutes (during the summer while not in school).  24-Hr Dietary Recall First Meal: boiled egg and sausage or protein shake or omelet Snack:   Second Meal: [place] or ham + cheese stick or salad with a protein, with lowfat dressing Snack: nuts or skinny pop popcorn Third Meal: fish or chicken or shrimp with baked potato or mashed potato with lowfat cream cheese + green beans or [place] salad with beans and corn Snack: sugar free jello Beverages: water, gatorade zero, water + flavorings   Post-Op Goals/ Signs/ Symptoms Using straws: no Drinking while eating: no Chewing/swallowing difficulties: no Changes in vision: no Changes to mood/headaches: no Hair loss/changes to skin/nails: no Difficulty focusing/concentrating: no Sweating: no Limb weakness: no Dizziness/lightheadedness: no Palpitations: no  Carbonated/caffeinated beverages: no N/V/D/C/Gas: no Abdominal pain: no Dumping syndrome: no    NUTRITION DIAGNOSIS  Overweight/obesity ([place]-3.3) related to past poor dietary habits and physical inactivity as evidenced by completed bariatric surgery and following dietary guidelines for  continued weight loss and healthy nutrition status.     NUTRITION INTERVENTION Nutrition counseling (C-1) and education (E-2) to facilitate bariatric surgery goals, including: Diet advancement to the next phase (phase 6) now including fruit  The importance of consuming adequate calories as well as certain nutrients daily due to the body's need for essential vitamins, minerals, and fats The importance of daily physical activity and to reach a goal of at least 150 minutes of moderate to vigorous physical activity weekly (or as directed by their physician) due to benefits such as increased musculature and improved lab values The importance of intuitive  eating specifically learning hunger-satiety cues and understanding the importance of learning a new body: The importance of mindful eating to avoid grazing behaviors   Goals: Focus on fluid only worry about what that fluid is later when reaching those goals -Eat non-starchy vegetables 2 times a day 7 days a week -Start out with soft cooked vegetables today and tomorrow; if tolerated begin to eat raw vegetables or cooked including salads -Eat your 3 ounces of protein first then start in on your non-starchy vegetables; once you understand how much of your meal leads to satisfaction and not full while still eating 3 ounces of protein and non-starchy vegetables you can eat them in any order  -Continue to aim for 30 minutes of activity at least 5 times a week -Do NOT cook with/add to your food: alfredo sauce, cheese sauce, barbeque sauce, ketchup, fat back, butter, bacon grease, grease, Crisco, OR SUGAR -When not wanting to eat meat instead of eating mashed potatoes eat plant based protein -Continue physical activity in the summer and school year.   Handouts Provided Include  Phase 6  Learning Style & Readiness for Change Teaching method utilized: Visual & Auditory  Demonstrated degree of understanding via: Teach Back  Readiness Level: action Barriers to learning/adherence to lifestyle change: none identified   RD's Notes for Next Visit Assess adherence to pt chosen goals    MONITORING & EVALUATION Dietary intake, weekly physical activity, body weight  Next Steps Patient is to follow-up in 4-5 months for 12 month visit

## 2022-07-17 ENCOUNTER — Encounter (INDEPENDENT_AMBULATORY_CARE_PROVIDER_SITE_OTHER): Payer: Self-pay

## 2022-09-09 ENCOUNTER — Other Ambulatory Visit: Payer: Self-pay | Admitting: Family Medicine

## 2022-10-01 ENCOUNTER — Encounter: Payer: BC Managed Care – PPO | Admitting: Dietician

## 2022-10-03 ENCOUNTER — Ambulatory Visit: Payer: BC Managed Care – PPO | Admitting: Dietician

## 2022-10-08 ENCOUNTER — Encounter: Payer: BC Managed Care – PPO | Attending: General Surgery | Admitting: Dietician

## 2022-10-08 ENCOUNTER — Encounter: Payer: Self-pay | Admitting: Dietician

## 2022-10-08 DIAGNOSIS — E669 Obesity, unspecified: Secondary | ICD-10-CM | POA: Insufficient documentation

## 2022-10-08 NOTE — Progress Notes (Signed)
Bariatric Nutrition Follow-Up Visit Medical Nutrition Therapy   NUTRITION ASSESSMENT  Surgery date: 10/09/2021 Surgery type: sleeve  Anthropometrics  Start weight at [facility]: 299.5 lbs (date: 07/17/2021)  Height: 66 in Weight today: 211.0 BMI: 34.06 kg/m2     Clinical  Medical hx: HTN Medications: multivitamin, vitamin D, aspirin, biotin, Zyrtec, magnesium, amlodipine, fluticasone  Labs: none updated in EMR Notable signs/symptoms: some knee pain Any previous deficiencies? No  Body Composition Scale 12/05/2021 06/17/2022 10/08/2022  Weight  lbs 260.2 211.8 211.0  Total Body Fat  % 46.3 41.4 41.3     Visceral Fat '18 13 13  '$ Fat-Free Mass  % 53.6 58.5 58.6     Total Body Water  % 41.3 43.7 43.8     Muscle-Mass  lbs 31.8 30.9 30.9  BMI 42 34.1 33.9  Body Fat Displacement ---          Torso  lbs 74.9 54.3 54.0        Left Leg  lbs 14.9 10.8 10.8        Right Leg  lbs 14.9 10.8 10.8        Left Arm  lbs 7.4 5.4 5.4        Right Arm  lbs 7.4 5.4 5.4    Lifestyle & Dietary Hx  Pt arrived for her one year follow up. Pt states she will have a protein shake she has on hand in case she needs it. Pt states she is still taking multivitamin and calcium. Pt states she is pleased with her success, stating she was over 300 pounds at the start.  Pt is interested in returning in 3 months to see where she is at after starting the maintenance phase food plan. Pt states she still struggles to get enough fluids in, stating some days she reaches 64 fluid ounces of water and other sugar free liquids.    Estimated daily fluid intake: 48-64 oz Estimated daily protein intake: 60 g Supplements: multi and calcium Current average weekly physical activity: ADLs.  24-Hr Dietary Recall First Meal: [employer] non-fat milk (8oz) and boiled egg Snack:   Second Meal: Tuna pack or small salad or green beans or broccoli or [place] or ham + cheese stick or salad with a protein, with lowfat dressing Snack:  nuts or skinny pop popcorn Third Meal: beef or pork loin or fish or chicken or shrimp with baked potato or mashed potato with lowfat cream cheese or rice + green beans or [place] salad with beans and corn or squash Snack: sugar free jello Beverages: water, gatorade zero, water + flavorings, zero minute maid  Post-Op Goals/ Signs/ Symptoms Using straws: no Drinking while eating: no Chewing/swallowing difficulties: no Changes in vision: no Changes to mood/headaches: no Hair loss/changes to skin/nails: no Difficulty focusing/concentrating: no Sweating: no Limb weakness: no Dizziness/lightheadedness: no Palpitations: no  Carbonated/caffeinated beverages: no N/V/D/C/Gas: no Abdominal pain: no Dumping syndrome: no    NUTRITION DIAGNOSIS  Overweight/obesity ([place]-3.3) related to past poor dietary habits and physical inactivity as evidenced by completed bariatric surgery and following dietary guidelines for continued weight loss and healthy nutrition status.     NUTRITION INTERVENTION Nutrition counseling (C-1) and education (E-2) to facilitate bariatric surgery goals, including: Diet advancement to maintenance phase including whole grains. Why you need complex carbohydrates: Whole grains and other complex carbohydrates are required to have a healthy diet. Whole grains provide fiber which can help with blood glucose levels and help keep you satiated. Fruits and starchy vegetables provide essential  vitamins and minerals required for immune function, eyesight support, brain support, bone density, wound healing and many other functions within the body. According to the current evidenced based 2020-2025 Dietary Guidelines for Americans, complex carbohydrates are part of a healthy eating pattern which is associated with a decreased risk for type 2 diabetes, cancers, and cardiovascular disease.  The importance of consuming adequate calories as well as certain nutrients daily due to the body's need for  essential vitamins, minerals, and fats The importance of daily physical activity and to reach a goal of at least 150 minutes of moderate to vigorous physical activity weekly (or as directed by their physician) due to benefits such as increased musculature and improved lab values The importance of intuitive eating specifically learning hunger-satiety cues and understanding the importance of learning a new body: The importance of mindful eating to avoid grazing behaviors   Goals: Focus on fluid only worry about what that fluid is later when reaching those goals -Eat your 3 ounces of protein first then non-starchy vegetables then complex carbohydrates; once you understand how much of your meal leads to satisfaction and not full while still eating 3 ounces of protein, non-starchy vegetables, and complex carbohydrates you can eat them in any order. -Continue to aim for 30 minutes of activity at least 5 times a week -Re-engage/Continue physical activity. Get back to the gym.   Handouts Provided Include  Bariatric MyPlate  Learning Style & Readiness for Change Teaching method utilized: Visual & Auditory  Demonstrated degree of understanding via: Teach Back  Readiness Level: action Barriers to learning/adherence to lifestyle change: none identified   RD's Notes for Next Visit Assess adherence to pt chosen goals    MONITORING & EVALUATION Dietary intake, weekly physical activity, body weight  Next Steps Patient is to follow-up in 4-5 months for 12 month visit

## 2022-10-09 ENCOUNTER — Telehealth: Payer: BC Managed Care – PPO | Admitting: Physician Assistant

## 2022-10-09 DIAGNOSIS — B9789 Other viral agents as the cause of diseases classified elsewhere: Secondary | ICD-10-CM

## 2022-10-09 DIAGNOSIS — J019 Acute sinusitis, unspecified: Secondary | ICD-10-CM | POA: Diagnosis not present

## 2022-10-09 MED ORDER — PREDNISONE 20 MG PO TABS: 40.0000 mg | ORAL_TABLET | Freq: Every day | ORAL | 0 refills | Status: DC

## 2022-10-09 NOTE — Progress Notes (Signed)
Duplicate encounter.  Disregard. 

## 2022-10-09 NOTE — Progress Notes (Signed)
E-Visit for Sinus Problems  We are sorry that you are not feeling well.  Here is how we plan to help!  Based on what you have shared with me it looks like you have sinusitis.  Sinusitis is inflammation and infection in the sinus cavities of the head.  Based on your presentation I believe you most likely have Acute Viral Sinusitis.This is an infection most likely caused by a virus. There is not specific treatment for viral sinusitis other than to help you with the symptoms until the infection runs its course.  You may use an oral decongestant such as Mucinex D or if you have glaucoma or high blood pressure use plain Mucinex. Saline nasal spray help and can safely be used as often as needed for congestion,. Keep up with your Flonase. I have prescribed: Prednisone to take daily for 5 days.   Some authorities believe that zinc sprays or the use of Echinacea may shorten the course of your symptoms.  Sinus infections are not as easily transmitted as other respiratory infection, however we still recommend that you avoid close contact with loved ones, especially the very young and elderly.  Remember to wash your hands thoroughly throughout the day as this is the number one way to prevent the spread of infection!  Home Care: Only take medications as instructed by your medical team. Do not take these medications with alcohol. A steam or ultrasonic humidifier can help congestion.  You can place a towel over your head and breathe in the steam from hot water coming from a faucet. Avoid close contacts especially the very young and the elderly. Cover your mouth when you cough or sneeze. Always remember to wash your hands.  Get Help Right Away If: You develop worsening fever or sinus pain. You develop a severe head ache or visual changes. Your symptoms persist after you have completed your treatment plan.  Make sure you Understand these instructions. Will watch your condition. Will get help right away if  you are not doing well or get worse.   Thank you for choosing an e-visit.  Your e-visit answers were reviewed by a board certified advanced clinical practitioner to complete your personal care plan. Depending upon the condition, your plan could have included both over the counter or prescription medications.  Please review your pharmacy choice. Make sure the pharmacy is open so you can pick up prescription now. If there is a problem, you may contact your provider through [employer] and have the prescription routed to another pharmacy.  Your safety is important to [place]. If you have drug allergies check your prescription carefully.   For the next 24 hours you can use MyChart to ask questions about today's visit, request a non-urgent call back, or ask for a work or school excuse. You will get an email in the next two days asking about your experience. I hope that your e-visit has been valuable and will speed your recovery.

## 2022-10-09 NOTE — Progress Notes (Signed)
I have spent 5 minutes in review of e-visit questionnaire, review and updating patient chart, medical decision making and response to patient.   Chesni Vos Cody Dnasia Gauna, PA-C    

## 2022-11-07 ENCOUNTER — Ambulatory Visit: Payer: BC Managed Care – PPO | Admitting: Family Medicine

## 2022-11-08 ENCOUNTER — Encounter: Payer: Self-pay | Admitting: Family Medicine

## 2022-11-08 ENCOUNTER — Ambulatory Visit: Payer: BC Managed Care – PPO | Admitting: Family Medicine

## 2022-11-08 VITALS — BP 120/84 | HR 71 | Ht 66.0 in | Wt 208.0 lb

## 2022-11-08 DIAGNOSIS — J3089 Other allergic rhinitis: Secondary | ICD-10-CM | POA: Diagnosis not present

## 2022-11-08 DIAGNOSIS — Z23 Encounter for immunization: Secondary | ICD-10-CM

## 2022-11-08 DIAGNOSIS — E669 Obesity, unspecified: Secondary | ICD-10-CM

## 2022-11-08 DIAGNOSIS — E559 Vitamin D deficiency, unspecified: Secondary | ICD-10-CM

## 2022-11-08 DIAGNOSIS — Z9884 Bariatric surgery status: Secondary | ICD-10-CM

## 2022-11-08 DIAGNOSIS — I10 Essential (primary) hypertension: Secondary | ICD-10-CM

## 2022-11-08 DIAGNOSIS — M1712 Unilateral primary osteoarthritis, left knee: Secondary | ICD-10-CM

## 2022-11-08 DIAGNOSIS — R7302 Impaired glucose tolerance (oral): Secondary | ICD-10-CM

## 2022-11-08 DIAGNOSIS — E785 Hyperlipidemia, unspecified: Secondary | ICD-10-CM

## 2022-11-08 MED ORDER — FLUTICASONE PROPIONATE 50 MCG/ACT NA SUSP: 2.0000 | Freq: Every day | NASAL | 6 refills | Status: DC

## 2022-11-08 NOTE — Patient Instructions (Signed)
F/u in 6 months, call if you need me sooner  TdAP today  Please get covid vaccine  Fasting labs before year end  It is important that you exercise regularly at least 30 minutes 5 times a week. If you develop chest pain, have severe difficulty breathing, or feel very tired, stop exercising immediately and seek medical attention   Keep up the GREAT work!!!  Thanks for choosing [facility], we consider it a privelige to serve you.

## 2022-11-11 ENCOUNTER — Encounter: Payer: Self-pay | Admitting: Family Medicine

## 2022-11-11 NOTE — Assessment & Plan Note (Signed)
Less symptomatic with weight loss,surgery being deferred

## 2022-11-11 NOTE — Assessment & Plan Note (Signed)
Excellent rsponse to surgery,plan is to increase pohysical activity , ultimate goal of 150 lb  Patient re-educated about  the importance of commitment to a  minimum of 150 minutes of exercise per week as able.  The importance of healthy food choices with portion control discussed, as well as eating regularly and within a 12 hour window most days. The need to choose "clean , green" food 50 to 75% of the time is discussed, as well as to make water the primary drink and set a goal of 64 ounces water daily.       11/08/2022   11:41 AM 10/08/2022    4:42 PM 06/17/2022    2:16 PM  Weight /BMI  Weight 208 lb 211 lb 211 lb 12.8 oz  Height '5\' 6"'$  (1.676 m) '5\' 6"'$  (1.676 m) '5\' 5"'$  (1.651 m)  BMI 33.57 kg/m2 34.06 kg/m2 35.25 kg/m2

## 2022-11-11 NOTE — Assessment & Plan Note (Signed)
Controlled, no change in medication  

## 2022-11-11 NOTE — Assessment & Plan Note (Signed)
94 pound weight loss in 1 year, gREAT  Patient re-educated about  the importance of commitment to a  minimum of 150 minutes of exercise per week as able.  The importance of healthy food choices with portion control discussed, as well as eating regularly and within a 12 hour window most days. The need to choose "clean , green" food 50 to 75% of the time is discussed, as well as to make water the primary drink and set a goal of 64 ounces water daily.       11/08/2022   11:41 AM 10/08/2022    4:42 PM 06/17/2022    2:16 PM  Weight /BMI  Weight 208 lb 211 lb 211 lb 12.8 oz  Height '5\' 6"'$  (1.676 m) '5\' 6"'$  (1.676 m) '5\' 5"'$  (1.651 m)  BMI 33.57 kg/m2 34.06 kg/m2 35.25 kg/m2

## 2022-11-11 NOTE — Assessment & Plan Note (Signed)
Hyperlipidemia:Low fat diet discussed and encouraged.   Lipid Panel  Lab Results  Component Value Date   CHOL 186 05/11/2020   HDL 57 05/11/2020   LDLCALC 116 (H) 05/11/2020   TRIG 51 05/11/2020   CHOLHDL 3.3 05/11/2020     Updated lab needed at/ before next visit.

## 2022-11-11 NOTE — Progress Notes (Signed)
   [name]     MRN: [id number]      DOB: [birth year]   HPI [name] is here for follow up and re-evaluation of chronic medical conditions, medication management and review of any available recent lab and radiology data.  Preventive health is updated, specifically  Cancer screening and Immunization.   Questions or concerns regarding consultations or procedures which the PT has had in the interim are  addressed. Has done extremely well with weight loss following bariatric surgery with resolution of HTN and improvement in arthritic pain in the  knees The PT denies any adverse reactions to current medications since the last visit.  There are no new concerns.  There are no specific complaints   ROS Denies recent fever or chills. Denies uncontrolled  sinus pressure, nasal congestion, ear pain or sore throat. Denies chest congestion, productive cough or wheezing. Denies chest pains, palpitations and leg swelling Denies abdominal pain, nausea, vomiting,diarrhea or constipation.   Denies dysuria, frequency, hesitancy or incontinence. Denies joint pain, swelling and limitation in mobility. Denies headaches, seizures, numbness, or tingling. Denies depression, anxiety or insomnia. Denies skin break down or rash.   PE  BP 120/84   Pulse 71   Ht '5\' 6"'$  (1.676 m)   Wt 208 lb (94.3 kg)   LMP 10/19/2011   SpO2 96%   BMI 33.57 kg/m   Patient alert and oriented and in no cardiopulmonary distress.  HEENT: No facial asymmetry, EOMI,     Neck supple .  Chest: Clear to auscultation bilaterally.  CVS: S1, S2 no murmurs, no S3.Regular rate.  ABD: Soft non tender.   Ext: No edema  MS: Adequate ROM spine, shoulders, hips and  knees.  Skin: Intact, no ulcerations or rash noted.  Psych: Good eye contact, normal affect. Memory intact not anxious or depressed appearing.  CNS: CN 2-12 intact, power,  normal throughout.no focal deficits noted.   Assessment & Plan

## 2022-11-11 NOTE — Assessment & Plan Note (Signed)
DASH diet and commitment to daily physical activity for a minimum of 30 minutes discussed and encouraged, as a part of hypertension management. The importance of attaining a healthy weight is also discussed.     11/08/2022   12:12 PM 11/08/2022   11:41 AM 10/08/2022    4:42 PM 06/17/2022    2:16 PM 03/20/2022    5:29 PM 01/09/2022    4:40 PM 01/09/2022    4:17 PM  BP/Weight  Systolic BP 893 810    175 102  Diastolic BP 84 75    80 79  Wt. (Lbs)  208 211 211.8 226  250  BMI  33.57 kg/m2 34.06 kg/m2 35.25 kg/m2 37.61 kg/m2  40.35 kg/m2     Resoved with 94 pound weight loss in past 1 year

## 2022-11-19 ENCOUNTER — Telehealth: Payer: BC Managed Care – PPO | Admitting: Nurse Practitioner

## 2022-11-19 DIAGNOSIS — R051 Acute cough: Secondary | ICD-10-CM | POA: Diagnosis not present

## 2022-11-19 DIAGNOSIS — J0110 Acute frontal sinusitis, unspecified: Secondary | ICD-10-CM | POA: Diagnosis not present

## 2022-11-19 MED ORDER — DOXYCYCLINE HYCLATE 100 MG PO TABS: 100.0000 mg | ORAL_TABLET | Freq: Two times a day (BID) | ORAL | 0 refills | Status: AC

## 2022-11-19 MED ORDER — BENZONATATE 100 MG PO CAPS: 100.0000 mg | ORAL_CAPSULE | Freq: Three times a day (TID) | ORAL | 0 refills | Status: DC | PRN

## 2022-11-19 MED ORDER — FLUTICASONE PROPIONATE 50 MCG/ACT NA SUSP: 2.0000 | Freq: Every day | NASAL | 6 refills | Status: DC

## 2022-11-19 NOTE — Progress Notes (Signed)
We are sorry that you are not feeling well.  Here is how we plan to help!  Based on your presentation I believe you most likely have A cough due to bacteria.  When patients have a fever and a productive cough with a change in color or increased sputum production, we are concerned about bacterial bronchitis.  If left untreated it can progress to pneumonia.  If your symptoms do not improve with your treatment plan it is important that you contact your provider.   I have prescribed Doxycycline 100 mg twice a day for 7 days     In addition you may use A prescription cough medication called Tessalon Perles '100mg'$ . You may take 1-2 capsules every 8 hours as needed for your cough.  We will also refill your flonase to help with your sinus congestion.   Meds ordered this encounter  Medications   doxycycline (VIBRA-TABS) 100 MG tablet    Sig: Take 1 tablet (100 mg total) by mouth 2 (two) times daily for 10 days.    Dispense:  20 tablet    Refill:  0   benzonatate (TESSALON) 100 MG capsule    Sig: Take 1 capsule (100 mg total) by mouth 3 (three) times daily as needed.    Dispense:  30 capsule    Refill:  0   fluticasone (FLONASE) 50 MCG/ACT nasal spray    Sig: Place 2 sprays into both nostrils daily.    Dispense:  16 g    Refill:  6     From your responses in the eVisit questionnaire you describe inflammation in the upper respiratory tract which is causing a significant cough.  This is commonly called Bronchitis    HOME CARE Only take medications as instructed by your medical team. Complete the entire course of an antibiotic. Drink plenty of fluids and get plenty of rest. Avoid close contacts especially the very young and the elderly Cover your mouth if you cough or cough into your sleeve. Always remember to wash your hands A steam or ultrasonic humidifier can help congestion.   GET HELP RIGHT AWAY IF: You develop worsening fever. You become short of breath You cough up blood. Your  symptoms persist after you have completed your treatment plan MAKE SURE YOU  Understand these instructions. Will watch your condition. Will get help right away if you are not doing well or get worse.    Thank you for choosing an e-visit.  Your e-visit answers were reviewed by a board certified advanced clinical practitioner to complete your personal care plan. Depending upon the condition, your plan could have included both over the counter or prescription medications.  Please review your pharmacy choice. Make sure the pharmacy is open so you can pick up prescription now. If there is a problem, you may contact your provider through [employer] and have the prescription routed to another pharmacy.  Your safety is important to [place]. If you have drug allergies check your prescription carefully.   For the next 24 hours you can use MyChart to ask questions about today's visit, request a non-urgent call back, or ask for a work or school excuse. You will get an email in the next two days asking about your experience. I hope that your e-visit has been valuable and will speed your recovery.   I spent approximately 5 minutes reviewing the patient's history, current symptoms and coordinating their care today.

## 2022-12-04 ENCOUNTER — Encounter: Payer: Self-pay | Admitting: Internal Medicine

## 2022-12-04 ENCOUNTER — Ambulatory Visit: Payer: BC Managed Care – PPO | Admitting: Internal Medicine

## 2022-12-04 VITALS — BP 145/80 | HR 98 | Resp 16 | Ht 66.0 in | Wt 203.0 lb

## 2022-12-04 DIAGNOSIS — M26629 Arthralgia of temporomandibular joint, unspecified side: Secondary | ICD-10-CM | POA: Diagnosis not present

## 2022-12-04 MED ORDER — CYCLOBENZAPRINE HCL 5 MG PO TABS: 5.0000 mg | ORAL_TABLET | Freq: Every evening | ORAL | 1 refills | Status: DC | PRN

## 2022-12-04 MED ORDER — MELOXICAM 7.5 MG PO TABS: 7.5000 mg | ORAL_TABLET | Freq: Every day | ORAL | 0 refills | Status: AC

## 2022-12-04 NOTE — Progress Notes (Signed)
Acute Office Visit  Subjective:     Patient ID: [name], female    DOB: 1961/11/19, 61 y.o.   MRN: [id number]  Chief Complaint  Patient presents with   Headache    Had been hurting on right side of face and sinuses around 12/12 and was given antibiotic doxepin '100mg'$  and started having headaches that won't go away and insomnia   Ms. Cronin presents today for an acute visit for right-sided headache.  She endorses a history of TMJ and states that her current symptoms are similar to what she has experienced during TMJ flares.  She currently endorses dull pain in her right temporal region, jaw, and ear.  Pain is most bothersome at night.  She has no visual changes and or issues with mastication.  Ms. Ambroise states that she periodically wears a dental guard for TMJ.  She has not been wearing it recently.  She also reports that she has been taking doxepin for treatment of sinus congestion.  She believes this triggered her current symptoms.  Ms. Varnell has managing her pain with Tylenol and home PT exercises that she has been previously provided with.  Review of Systems  Constitutional:  Negative for chills, fever and malaise/fatigue.  HENT:  Positive for ear pain (R ear).        R jaw pain, pain in R temporal region  Eyes:  Negative for blurred vision, double vision, photophobia, pain, discharge and redness.  Cardiovascular:  Negative for chest pain.  Neurological:  Positive for headaches (R temporal region).  All other systems reviewed and are negative.     Objective:    BP (!) 145/80   Pulse 98   Resp 16   Ht '5\' 6"'$  (1.676 m)   Wt 203 lb (92.1 kg)   LMP 10/19/2011   SpO2 96%   BMI 32.77 kg/m   Physical Exam Vitals reviewed.  Constitutional:      General: She is not in acute distress.    Appearance: She is well-developed. She is not toxic-appearing.  HENT:     Head: Normocephalic.     Comments: TTP over right temporal region    Right Ear: Tympanic membrane, ear canal and  external ear normal.     Left Ear: Tympanic membrane, ear canal and external ear normal.     Nose: Nose normal.     Mouth/Throat:     Mouth: Mucous membranes are moist.     Pharynx: Oropharynx is clear. No oropharyngeal exudate or posterior oropharyngeal erythema.  Eyes:     General: No scleral icterus.    Extraocular Movements: Extraocular movements intact.     Conjunctiva/sclera: Conjunctivae normal.     Pupils: Pupils are equal, round, and reactive to light.  Cardiovascular:     Rate and Rhythm: Normal rate and regular rhythm.     Pulses: Normal pulses.     Heart sounds: Normal heart sounds. No murmur heard.    No friction rub. No gallop.  Pulmonary:     Effort: Pulmonary effort is normal.     Breath sounds: Normal breath sounds. No wheezing, rhonchi or rales.  Abdominal:     General: Abdomen is flat. Bowel sounds are normal. There is no distension.     Palpations: Abdomen is soft.     Tenderness: There is no abdominal tenderness.  Musculoskeletal:        General: Normal range of motion.     Cervical back: Normal range of motion.  Skin:  General: Skin is warm and dry.     Coloration: Skin is not jaundiced.  Neurological:     Mental Status: She is alert.       Assessment & Plan:   Problem List Items Addressed This Visit       TMJ syndrome - Primary    She has been evaluated today for the symptoms endorsed above, which are the same as previous episodes of TMJ flare.  She has pain in her right temporal region, right jaw, and ear.  Her exam overall is benign.  She recently took doxepin for sinus congestion, which she believes triggered her current symptoms.  She has been using Tylenol and performing home PT exercises for symptom management.  No red flag symptoms identified. -I have prescribed meloxicam and Flexeril today for symptom relief -I recommended that she continue home PT exercises and wear her dental guard nightly -She was instructed to return to care if her  symptoms worsen or fail to improve.  Otherwise she has routine follow-up scheduled for 12/19/2022       Meds ordered this encounter  Medications   meloxicam (MOBIC) 7.5 MG tablet    Sig: Take 1 tablet (7.5 mg total) by mouth daily for 14 days.    Dispense:  14 tablet    Refill:  0   cyclobenzaprine (FLEXERIL) 5 MG tablet    Sig: Take 1 tablet (5 mg total) by mouth at bedtime as needed for muscle spasms (TMJ symptoms).    Dispense:  30 tablet    Refill:  1   Return if symptoms worsen or fail to improve.  Johnette Abraham, MD

## 2022-12-04 NOTE — Patient Instructions (Signed)
It was a pleasure to see you today.  Thank you for giving [place] the opportunity to be involved in your care.  Below is a brief recap of your visit and next steps.  We will plan to see you again in June  Summary I have prescribed Meloxicam and Flexeril for treatment of TMJ. I recommend that you wear your guard at night and continue PT exercises for TMJ.

## 2022-12-05 ENCOUNTER — Ambulatory Visit: Payer: BC Managed Care – PPO | Admitting: Family Medicine

## 2022-12-05 LAB — CBC WITH DIFFERENTIAL/PLATELET
Basophils Absolute: 0 10*3/uL (ref 0.0–0.2)
Basos: 1 %
EOS (ABSOLUTE): 0.1 10*3/uL (ref 0.0–0.4)
Eos: 1 %
Hematocrit: 39.8 % (ref 34.0–46.6)
Hemoglobin: 13.2 g/dL (ref 11.1–15.9)
Immature Grans (Abs): 0 10*3/uL (ref 0.0–0.1)
Immature Granulocytes: 0 %
Lymphocytes Absolute: 1.4 10*3/uL (ref 0.7–3.1)
Lymphs: 40 %
MCH: 29.3 pg (ref 26.6–33.0)
MCHC: 33.2 g/dL (ref 31.5–35.7)
MCV: 88 fL (ref 79–97)
Monocytes Absolute: 0.6 10*3/uL (ref 0.1–0.9)
Monocytes: 17 %
Neutrophils Absolute: 1.4 10*3/uL (ref 1.4–7.0)
Neutrophils: 41 %
Platelets: 290 10*3/uL (ref 150–450)
RBC: 4.51 x10E6/uL (ref 3.77–5.28)
RDW: 11.5 % — ABNORMAL LOW (ref 11.7–15.4)
WBC: 3.5 10*3/uL (ref 3.4–10.8)

## 2022-12-05 LAB — LIPID PANEL
Chol/HDL Ratio: 2.8 ratio (ref 0.0–4.4)
Cholesterol, Total: 215 mg/dL — ABNORMAL HIGH (ref 100–199)
HDL: 77 mg/dL (ref >39)
LDL Chol Calc (NIH): 127 mg/dL — ABNORMAL HIGH (ref 0–99)
Triglycerides: 63 mg/dL (ref 0–149)
VLDL Cholesterol Cal: 11 mg/dL (ref 5–40)

## 2022-12-05 LAB — CMP14+EGFR
ALT: 19 IU/L (ref 0–32)
AST: 20 IU/L (ref 0–40)
Albumin/Globulin Ratio: 1.7 (ref 1.2–2.2)
Albumin: 4.4 g/dL (ref 3.8–4.9)
Alkaline Phosphatase: 84 IU/L (ref 44–121)
BUN/Creatinine Ratio: 14 (ref 9–23)
BUN: 10 mg/dL (ref 6–24)
Bilirubin Total: 0.3 mg/dL (ref 0.0–1.2)
CO2: 23 mmol/L (ref 20–29)
Calcium: 10.3 mg/dL — ABNORMAL HIGH (ref 8.7–10.2)
Chloride: 102 mmol/L (ref 96–106)
Creatinine, Ser: 0.71 mg/dL (ref 0.57–1.00)
Globulin, Total: 2.6 g/dL (ref 1.5–4.5)
Glucose: 101 mg/dL — ABNORMAL HIGH (ref 70–99)
Potassium: 4 mmol/L (ref 3.5–5.2)
Sodium: 142 mmol/L (ref 134–144)
Total Protein: 7 g/dL (ref 6.0–8.5)
eGFR: 98 mL/min/{1.73_m2} (ref >59)

## 2022-12-05 LAB — VITAMIN D 25 HYDROXY (VIT D DEFICIENCY, FRACTURES): Vit D, 25-Hydroxy: 63.7 ng/mL (ref 30.0–100.0)

## 2022-12-05 LAB — TSH: TSH: 1.58 u[IU]/mL (ref 0.450–4.500)

## 2022-12-10 ENCOUNTER — Encounter: Payer: Self-pay | Admitting: Emergency Medicine

## 2022-12-10 ENCOUNTER — Ambulatory Visit
Admission: EM | Admit: 2022-12-10 | Discharge: 2022-12-10 | Disposition: A | Discharge status: Home / Self Care | Payer: BC Managed Care – PPO | Attending: Urgent Care | Admitting: Urgent Care

## 2022-12-10 DIAGNOSIS — J018 Other acute sinusitis: Secondary | ICD-10-CM

## 2022-12-10 DIAGNOSIS — M26629 Arthralgia of temporomandibular joint, unspecified side: Secondary | ICD-10-CM | POA: Insufficient documentation

## 2022-12-10 MED ORDER — AMOXICILLIN 875 MG PO TABS: 875.0000 mg | ORAL_TABLET | Freq: Two times a day (BID) | ORAL | 0 refills | Status: DC

## 2022-12-10 MED ORDER — CETIRIZINE HCL 10 MG PO TABS: 10.0000 mg | ORAL_TABLET | Freq: Every day | ORAL | 0 refills | Status: DC

## 2022-12-10 MED ORDER — PSEUDOEPHEDRINE HCL 30 MG PO TABS: 30.0000 mg | ORAL_TABLET | Freq: Three times a day (TID) | ORAL | 0 refills | Status: DC | PRN

## 2022-12-10 NOTE — ED Triage Notes (Signed)
Patient c/o cough, congestion, nasal drainage, low grade fever x 10 days.  Patient did do an e-visit and the medication prescribed caused her BP to rise.

## 2022-12-10 NOTE — ED Provider Notes (Signed)
[facility]  Note:  This document was prepared using Dragon voice recognition software and may include unintentional dictation errors.  MRN: [id number] DOB: 1961-05-30  Subjective:   [name] is a 62 y.o. female presenting for 10 day history of acute onset persistent and worsening sinus congestion, sinus drainage, sinus headaches, fever, coughing.  No chest pain, shortness of breath or wheezing.  Did COVID testing and was negative.  Has a history of allergic rhinitis.  No smoking, vaping, marijuana use.  No current facility-administered medications for this encounter.  Current Outpatient Medications:    amLODipine (NORVASC) 10 MG tablet, Take 1 tablet by mouth daily., Disp: , Rfl:    cyclobenzaprine (FLEXERIL) 5 MG tablet, Take 1 tablet (5 mg total) by mouth at bedtime as needed for muscle spasms (TMJ symptoms)., Disp: 30 tablet, Rfl: 1   fluticasone (FLONASE) 50 MCG/ACT nasal spray, Place 2 sprays into both nostrils daily., Disp: 16 g, Rfl: 6   fluticasone (FLONASE) 50 MCG/ACT nasal spray, Place 2 sprays into both nostrils daily., Disp: 16 g, Rfl: 6   meloxicam (MOBIC) 7.5 MG tablet, Take 1 tablet (7.5 mg total) by mouth daily for 14 days., Disp: 14 tablet, Rfl: 0   Multiple Vitamin (MULTIVITAMIN) tablet, Take 1 tablet by mouth daily., Disp: , Rfl:    benzonatate (TESSALON) 100 MG capsule, Take 1 capsule (100 mg total) by mouth 3 (three) times daily as needed. (Patient not taking: Reported on 12/04/2022), Disp: 30 capsule, Rfl: 0   montelukast (SINGULAIR) 10 MG tablet, Take 1 tablet by mouth at bedtime., Disp: , Rfl:    Allergies  Allergen Reactions   Sulfamethoxazole-Trimethoprim Nausea Only    Past Medical History:  Diagnosis Date   Arthritis    Hypertension    Joint pain    Left knee pain    Pain of right clavicle 10/17/2018   Pneumonia    Seasonal allergies      Past Surgical History:  Procedure Laterality Date   BIOPSY  06/29/2021   Procedure:  BIOPSY;  Surgeon: Harvel Quale, MD;  Location: [facility];  Service: Gastroenterology;;   CESAREAN SECTION     COLONOSCOPY N/A 09/15/2014   Procedure: COLONOSCOPY;  Surgeon: Rogene Houston, MD;  Location: [facility];  Service: Endoscopy;  Laterality: N/A;  830   COLONOSCOPY WITH PROPOFOL N/A 06/29/2021   Procedure: COLONOSCOPY WITH PROPOFOL;  Surgeon: Harvel Quale, MD;  Location: [facility];  Service: Gastroenterology;  Laterality: N/A;  9:35   Cyst removed from buttock  08/10/1979   POLYPECTOMY  06/29/2021   Procedure: POLYPECTOMY;  Surgeon: Harvel Quale, MD;  Location: [facility];  Service: Gastroenterology;;   UPPER GI ENDOSCOPY N/A 10/09/2021   Procedure: UPPER GI ENDOSCOPY;  Surgeon: Greer Pickerel, MD;  Location: [facility];  Service: General;  Laterality: N/A;    Family History  Problem Relation Age of Onset   Diabetes Mother    High blood pressure Mother    Colon cancer Neg Hx     Social History   Tobacco Use   Smoking status: Never   Smokeless tobacco: Never  Vaping Use   Vaping Use: Never used  Substance Use Topics   Alcohol use: No   Drug use: No    ROS   Objective:   Vitals: BP 133/85 (BP Location: Right Arm)   Pulse 80   Temp 98.7 F (37.1 C) (Oral)   Resp 16   Ht '5\' 6"'$  (1.676  m)   Wt 203 lb (92.1 kg)   LMP 10/19/2011   SpO2 98%   BMI 32.77 kg/m   Physical Exam Constitutional:      General: She is not in acute distress.    Appearance: Normal appearance. She is well-developed and normal weight. She is not ill-appearing, toxic-appearing or diaphoretic.  HENT:     Head: Normocephalic and atraumatic.     Right Ear: Tympanic membrane, ear canal and external ear normal. No drainage or tenderness. No middle ear effusion. There is no impacted cerumen. Tympanic membrane is not erythematous or bulging.     Left Ear: Tympanic membrane, ear canal and external ear normal. No drainage or tenderness.  No middle ear  effusion. There is no impacted cerumen. Tympanic membrane is not erythematous or bulging.     Nose: Congestion and rhinorrhea present.     Mouth/Throat:     Mouth: Mucous membranes are moist. No oral lesions.     Pharynx: Posterior oropharyngeal erythema present. No pharyngeal swelling, oropharyngeal exudate or uvula swelling.     Tonsils: No tonsillar exudate or tonsillar abscesses.  Eyes:     General: No scleral icterus.       Right eye: No discharge.        Left eye: No discharge.     Extraocular Movements: Extraocular movements intact.     Right eye: Normal extraocular motion.     Left eye: Normal extraocular motion.     Conjunctiva/sclera: Conjunctivae normal.  Cardiovascular:     Rate and Rhythm: Normal rate and regular rhythm.     Heart sounds: Normal heart sounds. No murmur heard.    No friction rub. No gallop.  Pulmonary:     Effort: Pulmonary effort is normal. No respiratory distress.     Breath sounds: No stridor. No wheezing, rhonchi or rales.  Chest:     Chest wall: No tenderness.  Musculoskeletal:     Cervical back: Normal range of motion and neck supple.  Lymphadenopathy:     Cervical: No cervical adenopathy.  Skin:    General: Skin is warm and dry.  Neurological:     General: No focal deficit present.     Mental Status: She is alert and oriented to person, place, and time.  Psychiatric:        Mood and Affect: Mood normal.        Behavior: Behavior normal.      Assessment and Plan :   PDMP not reviewed this encounter.  1. Other acute sinusitis, recurrence not specified     Deferred imaging given clear cardiopulmonary exam, hemodynamically stable vital signs. Will start empiric treatment for sinusitis with amoxicillin.  Recommended supportive care otherwise. Counseled patient on potential for adverse effects with medications prescribed/recommended today, ER and return-to-clinic precautions discussed, patient verbalized understanding.    [name],  [place] 12/10/22 1547

## 2022-12-10 NOTE — Assessment & Plan Note (Addendum)
She has been evaluated today for the symptoms endorsed above, which are the same as previous episodes of TMJ flare.  She has pain in her right temporal region, right jaw, and ear.  Her exam overall is benign.  She recently took doxepin for sinus congestion, which she believes triggered her current symptoms.  She has been using Tylenol and performing home PT exercises for symptom management.  No red flag symptoms identified. -I have prescribed meloxicam and Flexeril today for symptom relief -I recommended that she continue home PT exercises and wear her dental guard nightly -She was instructed to return to care if her symptoms worsen or fail to improve.  Otherwise she has routine follow-up scheduled for 12/19/2022

## 2022-12-19 ENCOUNTER — Encounter: Payer: Self-pay | Admitting: Family Medicine

## 2022-12-19 ENCOUNTER — Ambulatory Visit: Payer: BC Managed Care – PPO | Admitting: Family Medicine

## 2022-12-19 VITALS — BP 126/76 | HR 81 | Ht 66.0 in | Wt 208.0 lb

## 2022-12-19 DIAGNOSIS — R222 Localized swelling, mass and lump, trunk: Secondary | ICD-10-CM

## 2022-12-19 DIAGNOSIS — E669 Obesity, unspecified: Secondary | ICD-10-CM

## 2022-12-19 DIAGNOSIS — R9402 Abnormal brain scan: Secondary | ICD-10-CM

## 2022-12-19 DIAGNOSIS — E785 Hyperlipidemia, unspecified: Secondary | ICD-10-CM | POA: Diagnosis not present

## 2022-12-19 DIAGNOSIS — I10 Essential (primary) hypertension: Secondary | ICD-10-CM | POA: Diagnosis not present

## 2022-12-19 DIAGNOSIS — M26629 Arthralgia of temporomandibular joint, unspecified side: Secondary | ICD-10-CM

## 2022-12-19 DIAGNOSIS — R519 Headache, unspecified: Secondary | ICD-10-CM | POA: Diagnosis not present

## 2022-12-19 DIAGNOSIS — E66811 Obesity, class 1: Secondary | ICD-10-CM

## 2022-12-19 MED ORDER — AMLODIPINE BESYLATE 10 MG PO TABS: 10.0000 mg | ORAL_TABLET | Freq: Every day | ORAL | 3 refills | Status: DC

## 2022-12-19 NOTE — Patient Instructions (Addendum)
KLeep follow up as before, call if you need me sooner  You will be contacted re brain scan date and time due to new intermittent headache, and past h/o abn brain scan   Yoyu are referred to gen surgery re cyst on chest   BP is excellent  Work on low fat diet  Fasting lipid, chem 7 and EGFR3 to 5 days before June appt  It is important that you exercise regularly at least 30 minutes 5 times a week. If you develop chest pain, have severe difficulty breathing, or feel very tired, stop exercising immediately and seek medical attention   Thanks for choosing [facility], we consider it a privelige to serve you.

## 2022-12-19 NOTE — Progress Notes (Signed)
[name]     MRN: [id number]      DOB: 21-Oct-1961   HPI [name] is here for follow up and re-evaluation of chronic medical conditions, medication management and review of any available recent lab and radiology data.  Preventive health is updated, specifically  Cancer screening and Immunization. 3 month h/o cyst between breasts non tender, no change wants it gone or evaluated by Surgery Pressure over right side of head to right neck , central neck pain and bilateral shoulder pain and right neck stiffness , started 12/13 improved, and resolved, yesterday again symptoms returned   ROS Denies recent fever or chills. Denies sinus pressure, nasal congestion, ear pain or sore throat. Denies chest congestion, productive cough or wheezing. Denies chest pains, palpitations and leg swelling Denies abdominal pain, nausea, vomiting,diarrhea or constipation.   Denies dysuria, frequency, hesitancy or incontinence. Denies joint pain, swelling and limitation in mobility.  Denies depression, anxiety or insomnia.  PE  BP 126/76 (BP Location: Right Arm, Patient Position: Sitting, Cuff Size: Large)   Pulse 81   Ht '5\' 6"'$  (1.676 m)   Wt 208 lb 0.6 oz (94.4 kg)   LMP 10/19/2011   SpO2 98%   BMI 33.58 kg/m   Patient alert and oriented and in no cardiopulmonary distress.  HEENT: No facial asymmetry, EOMI,     Neck supple .No tenderness over either TMJ  Chest: Clear to auscultation bilaterally.  CVS: S1, S2 no murmurs, no S3.Regular rate.  ABD: Soft non tender.   Ext: No edema  MS: Adequate ROM spine, shoulders, hips and knees.  Skin: Intact, no ulcerations or rash noted.Non tender pea sized cyst in intercostal area  Psych: Good eye contact, normal affect. Memory intact not anxious or depressed appearing.  CNS: CN 2-12 intact, power,  normal throughout.no focal deficits noted.   Assessment & Plan  HTN (hypertension) Controlled, no change in medication DASH diet and commitment to  daily physical activity for a minimum of 30 minutes discussed and encouraged, as a part of hypertension management. The importance of attaining a healthy weight is also discussed.     12/19/2022    3:50 PM 12/10/2022    3:31 PM 12/04/2022    1:05 PM 11/08/2022   12:12 PM 11/08/2022   11:41 AM 10/08/2022    4:42 PM 06/17/2022    2:16 PM  BP/Weight  Systolic BP 062 694 854 627 035    Diastolic BP 76 85 80 84 75    Wt. (Lbs) 208.04 203 203  208 211 211.8  BMI 33.58 kg/m2 32.77 kg/m2 32.77 kg/m2  33.57 kg/m2 34.06 kg/m2 35.25 kg/m2       Dyslipidemia Hyperlipidemia:Low fat diet discussed and encouraged.   Lipid Panel  Lab Results  Component Value Date   CHOL 215 (H) 12/04/2022   HDL 77 12/04/2022   LDLCALC 127 (H) 12/04/2022   TRIG 63 12/04/2022   CHOLHDL 2.8 12/04/2022     Needs to reduce fried and fatty foods Updated lab needed at/ before next visit.   Obesity (BMI 30.0-34.9)  Patient re-educated about  the importance of commitment to a  minimum of 150 minutes of exercise per week as able.  The importance of healthy food choices with portion control discussed, as well as eating regularly and within a 12 hour window most days. The need to choose "clean , green" food 50 to 75% of the time is discussed, as well as to make water the primary drink and  set a goal of 64 ounces water daily.       12/19/2022    3:50 PM 12/10/2022    3:31 PM 12/04/2022    1:05 PM  Weight /BMI  Weight 208 lb 0.6 oz 203 lb 203 lb  Height '5\' 6"'$  (1.676 m) '5\' 6"'$  (1.676 m) '5\' 6"'$  (1.676 m)  BMI 33.58 kg/m2 32.77 kg/m2 32.77 kg/m2      Subcutaneous nodule of chest wall 3 month history, requests surgery eval, states want it removed, pea sized non tender  between breasts  New onset of headaches after age 45 Recurrent right sided headache/ discomfoirt since 11/20/2022, past h/o abnormal brain scan, needs brain scan  TMJ syndrome Improvement in TMJ pain, exam benign , not tender over right TMJ on  exam,however recurrent right sided headache since 12/13/20223

## 2022-12-23 ENCOUNTER — Encounter: Payer: Self-pay | Admitting: Family Medicine

## 2022-12-23 DIAGNOSIS — R519 Headache, unspecified: Secondary | ICD-10-CM | POA: Insufficient documentation

## 2022-12-23 DIAGNOSIS — R222 Localized swelling, mass and lump, trunk: Secondary | ICD-10-CM | POA: Insufficient documentation

## 2022-12-23 MED ORDER — LORAZEPAM 2 MG PO TABS: ORAL_TABLET | ORAL | 0 refills | Status: DC

## 2022-12-23 NOTE — Assessment & Plan Note (Addendum)
Controlled, no change in medication DASH diet and commitment to daily physical activity for a minimum of 30 minutes discussed and encouraged, as a part of hypertension management. The importance of attaining a healthy weight is also discussed.     12/19/2022    3:50 PM 12/10/2022    3:31 PM 12/04/2022    1:05 PM 11/08/2022   12:12 PM 11/08/2022   11:41 AM 10/08/2022    4:42 PM 06/17/2022    2:16 PM  BP/Weight  Systolic BP 545 625 638 937 342    Diastolic BP 76 85 80 84 75    Wt. (Lbs) 208.04 203 203  208 211 211.8  BMI 33.58 kg/m2 32.77 kg/m2 32.77 kg/m2  33.57 kg/m2 34.06 kg/m2 35.25 kg/m2

## 2022-12-23 NOTE — Assessment & Plan Note (Signed)
Recurrent right sided headache/ discomfoirt since 11/20/2022, past h/o abnormal brain scan, needs brain scan

## 2022-12-23 NOTE — Assessment & Plan Note (Signed)
  Patient re-educated about  the importance of commitment to a  minimum of 150 minutes of exercise per week as able.  The importance of healthy food choices with portion control discussed, as well as eating regularly and within a 12 hour window most days. The need to choose "clean , green" food 50 to 75% of the time is discussed, as well as to make water the primary drink and set a goal of 64 ounces water daily.       12/19/2022    3:50 PM 12/10/2022    3:31 PM 12/04/2022    1:05 PM  Weight /BMI  Weight 208 lb 0.6 oz 203 lb 203 lb  Height '5\' 6"'$  (1.676 m) '5\' 6"'$  (1.676 m) '5\' 6"'$  (1.676 m)  BMI 33.58 kg/m2 32.77 kg/m2 32.77 kg/m2

## 2022-12-23 NOTE — Assessment & Plan Note (Signed)
Hyperlipidemia:Low fat diet discussed and encouraged.   Lipid Panel  Lab Results  Component Value Date   CHOL 215 (H) 12/04/2022   HDL 77 12/04/2022   LDLCALC 127 (H) 12/04/2022   TRIG 63 12/04/2022   CHOLHDL 2.8 12/04/2022     Needs to reduce fried and fatty foods Updated lab needed at/ before next visit.

## 2022-12-23 NOTE — Assessment & Plan Note (Signed)
3 month history, requests surgery eval, states want it removed, pea sized non tender  between breasts

## 2022-12-23 NOTE — Assessment & Plan Note (Signed)
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

## 2023-01-01 ENCOUNTER — Ambulatory Visit (HOSPITAL_COMMUNITY)
Admission: RE | Admit: 2023-01-01 | Discharge: 2023-01-01 | Disposition: A | Discharge status: Home / Self Care | Payer: BC Managed Care – PPO | Source: Ambulatory Visit | Attending: Family Medicine | Admitting: Family Medicine

## 2023-01-01 DIAGNOSIS — R519 Headache, unspecified: Secondary | ICD-10-CM | POA: Insufficient documentation

## 2023-01-01 DIAGNOSIS — R9402 Abnormal brain scan: Secondary | ICD-10-CM | POA: Diagnosis present

## 2023-01-02 ENCOUNTER — Telehealth: Payer: Self-pay | Admitting: Family Medicine

## 2023-01-02 DIAGNOSIS — R9402 Abnormal brain scan: Secondary | ICD-10-CM

## 2023-01-02 DIAGNOSIS — R519 Headache, unspecified: Secondary | ICD-10-CM

## 2023-01-02 NOTE — Telephone Encounter (Signed)
Patient called asked if provider would return patient call to go over impression of her mri. She has some questions. [phone].

## 2023-01-03 NOTE — Addendum Note (Signed)
Addended by: Fayrene Helper on: 01/03/2023 12:57 PM   Modules accepted: Orders

## 2023-01-07 ENCOUNTER — Ambulatory Visit: Payer: BC Managed Care – PPO | Admitting: Surgery

## 2023-01-14 ENCOUNTER — Ambulatory Visit: Payer: BC Managed Care – PPO | Admitting: General Surgery

## 2023-01-23 ENCOUNTER — Ambulatory Visit: Payer: BC Managed Care – PPO | Admitting: Surgery

## 2023-01-23 ENCOUNTER — Ambulatory Visit: Payer: BC Managed Care – PPO | Admitting: Dietician

## 2023-02-13 ENCOUNTER — Encounter: Payer: Self-pay | Admitting: Surgery

## 2023-02-13 ENCOUNTER — Ambulatory Visit: Payer: BC Managed Care – PPO | Admitting: Surgery

## 2023-02-13 VITALS — BP 130/82 | HR 74 | Temp 98.7°F | Resp 14 | Ht 66.0 in | Wt 216.0 lb

## 2023-02-13 DIAGNOSIS — L720 Epidermal cyst: Secondary | ICD-10-CM | POA: Diagnosis not present

## 2023-02-13 NOTE — Progress Notes (Signed)
[facility] History and Physical  Reason for Referral: Chest wall lump Referring Physician: Dr. Moshe Cipro  Chief Complaint   New Patient (Initial Visit)     [name] is a 62 y.o. female.  HPI: Patient presents for evaluation of chest wall lump.  It has been present since December 2023.  It has been nontender and never caused her significant issues, but given its location on her left breast, she would like to have this area excised.  She denies fever, chills, and night sweats.  The patient has no history of any masses, lumps, bumps, nipple changes or discharge. She had menarche at age 6, and her first pregnancy at age 66. She is G2P1. She did breastfeed her children.  She has no history of any family breast cancer. She underwent menopause at 36.  She underwent a left breast biopsy in 2022, but this area was noted to be benign.  She has not had any chest radiation.  Her past medical history significant for hypertension and seasonal allergies.  Her surgical history significant for a gastric sleeve in 2022.  She denies use of blood thinning medications.  She denies use of tobacco products, alcohol, and illicit drugs.  Past Medical History:  Diagnosis Date   Arthritis    Hypertension    Joint pain    Left knee pain    Pain of right clavicle 10/17/2018   Pneumonia    Seasonal allergies     Past Surgical History:  Procedure Laterality Date   BIOPSY  06/29/2021   Procedure: BIOPSY;  Surgeon: Harvel Quale, MD;  Location: [facility];  Service: Gastroenterology;;   CESAREAN SECTION     COLONOSCOPY N/A 09/15/2014   Procedure: COLONOSCOPY;  Surgeon: Rogene Houston, MD;  Location: [facility];  Service: Endoscopy;  Laterality: N/A;  830   COLONOSCOPY WITH PROPOFOL N/A 06/29/2021   Procedure: COLONOSCOPY WITH PROPOFOL;  Surgeon: Harvel Quale, MD;  Location: [facility];  Service: Gastroenterology;  Laterality: N/A;  9:35   Cyst removed from  buttock  08/10/1979   POLYPECTOMY  06/29/2021   Procedure: POLYPECTOMY;  Surgeon: Harvel Quale, MD;  Location: [facility];  Service: Gastroenterology;;   UPPER GI ENDOSCOPY N/A 10/09/2021   Procedure: UPPER GI ENDOSCOPY;  Surgeon: Greer Pickerel, MD;  Location: [facility];  Service: General;  Laterality: N/A;    Family History  Problem Relation Age of Onset   Diabetes Mother    High blood pressure Mother    Colon cancer Neg Hx     Social History   Tobacco Use   Smoking status: Never   Smokeless tobacco: Never  Vaping Use   Vaping Use: Never used  Substance Use Topics   Alcohol use: No   Drug use: No    Medications: I have reviewed the patient's current medications. Allergies as of 02/13/2023       Reactions   Sulfamethoxazole-trimethoprim Nausea Only        Medication List        Accurate as of February 13, 2023  3:00 PM. If you have any questions, ask your nurse or doctor.          STOP taking these medications    cetirizine 10 MG tablet Commonly known as: ZyrTEC Allergy Stopped by: Enisa Runyan A Cass Edinger, DO   cyclobenzaprine 5 MG tablet Commonly known as: FLEXERIL Stopped by: Amandeep Hogston A Cherylanne Ardelean, DO   LORazepam 2 MG tablet Commonly known as: Ativan  Stopped by: Flint Melter Rilan Eiland, DO       TAKE these medications    amLODipine 10 MG tablet Commonly known as: NORVASC Take 1 tablet (10 mg total) by mouth daily.   fluticasone 50 MCG/ACT nasal spray Commonly known as: FLONASE Place 2 sprays into both nostrils daily.   multivitamin tablet Take 1 tablet by mouth daily.         ROS:  Constitutional: negative for chills and fatigue Eyes: negative for visual disturbance and pain Ears, nose, mouth, throat, and face: negative for ear drainage, sore throat, and sinus problems Respiratory: negative for cough, wheezing, and shortness of breath Cardiovascular: negative for chest pain and palpitations Gastrointestinal: negative for  abdominal pain, nausea, and reflux symptoms Genitourinary:negative for dysuria and frequency Integument/breast: negative for dryness and rash Hematologic/lymphatic: negative for bleeding and lymphadenopathy Musculoskeletal:negative for back pain and neck pain Neurological: negative for dizziness and tremors Endocrine: negative for temperature intolerance  Blood pressure 130/82, pulse 74, temperature 98.7 F (37.1 C), temperature source Oral, resp. rate 14, height '5\' 6"'$  (1.676 m), weight 216 lb (98 kg), last menstrual period 10/19/2011, SpO2 96 %. Physical Exam Vitals reviewed.  Constitutional:      Appearance: Normal appearance.  HENT:     Head: Normocephalic and atraumatic.  Eyes:     Extraocular Movements: Extraocular movements intact.     Pupils: Pupils are equal, round, and reactive to light.  Cardiovascular:     Rate and Rhythm: Normal rate and regular rhythm.  Pulmonary:     Effort: Pulmonary effort is normal.     Breath sounds: Normal breath sounds.  Abdominal:     General: There is no distension.     Palpations: Abdomen is soft.     Tenderness: There is no abdominal tenderness.  Musculoskeletal:        General: Normal range of motion.     Cervical back: Normal range of motion.  Skin:    General: Skin is warm and dry.     Comments: Small 1 cm bump at medial left inframammary fold, nontender to palpation  Neurological:     General: No focal deficit present.     Mental Status: She is alert and oriented to person, place, and time.  Psychiatric:        Mood and Affect: Mood normal.        Behavior: Behavior normal.     Results: No results found for this or any previous visit (from the past 48 hour(s)).  No results found.   Assessment & Plan:  [name] is a 62 y.o. female who presents for evaluation of chest wall lump.  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  And I explained recommendations for surgical excision -She is up-to-date on her  mammograms, with her most recent 1 demonstrating no abnormalities -The risk and benefits of chest wall mass excision were discussed including but not limited to bleeding, infection, injury to surrounding structures, and need for additional procedures.  After careful consideration, [name] has decided to proceed with procedure. -Will plan to perform this procedure in office, on 4/11 -Information provided to the patient regarding epidermoid cyst  All questions were answered to the satisfaction of the patient.   Graciella Freer, DO [facility] [address] [place], [place] [address] [phone] (office)

## 2023-03-06 ENCOUNTER — Ambulatory Visit: Payer: BC Managed Care – PPO | Admitting: Diagnostic Neuroimaging

## 2023-03-06 ENCOUNTER — Telehealth: Payer: Self-pay | Admitting: Neurology

## 2023-03-06 NOTE — Telephone Encounter (Signed)
Pt was scheduled for 4 pm today, Dr Leta Baptist has to leave early and will not be able to see her today. Pt will need to be rescheduled. Pt is aware apt today will be cancelled and Dr Leta Baptist asked she be called and rescheduled.

## 2023-03-06 NOTE — Telephone Encounter (Signed)
Called pt. Left voicemail message to please call office.

## 2023-03-20 ENCOUNTER — Ambulatory Visit (INDEPENDENT_AMBULATORY_CARE_PROVIDER_SITE_OTHER): Payer: BC Managed Care – PPO | Admitting: Surgery

## 2023-03-20 ENCOUNTER — Encounter: Payer: Self-pay | Admitting: Surgery

## 2023-03-20 VITALS — BP 122/81 | HR 80 | Temp 98.5°F | Resp 14 | Ht 66.0 in | Wt 221.0 lb

## 2023-03-20 DIAGNOSIS — L720 Epidermal cyst: Secondary | ICD-10-CM

## 2023-03-20 NOTE — Patient Instructions (Signed)
-  Take over the counter Advil or Tylenol as needed for pain -I will call you for a phone follow up in 2 weeks

## 2023-03-21 NOTE — Progress Notes (Signed)
[facility] Note   HPI:  62 y.o. Female presents to clinic for excision of subcutaneous cyst on medial left breast.  She denies any issues with the cyst since evaluation in the office.  She has no complaints at this time.  Review of Systems:  All other review of systems: otherwise negative   Vital Signs:  BP 122/81   Pulse 80   Temp 98.5 F (36.9 C) (Oral)   Resp 14   Ht 5\' 6"  (1.676 m)   Wt 221 lb (100.2 kg)   LMP 10/19/2011   SpO2 98%   BMI 35.67 kg/m    Physical Exam:  Physical Exam Vitals reviewed.  Constitutional:      Appearance: Normal appearance.  Chest:       Comments: 1 cm left breast cyst at medial aspect of left breast, nontender to palpation Neurological:     Mental Status: She is alert.     Laboratory studies: None  Imaging:  None  Assessment:  62 y.o. yo Female who presents for in office left medial breast cyst excision.  Plan:  -The risks and benefits of bedside left breast cyst excision were discussed, including but not limited to bleeding, infection, injury to surrounding structures, and need for additional procedures.  After careful consideration, Jaeleigh Gardiner has decided to proceed with an office procedure. -Written consent was obtained -Please refer to procedure note below for details -She has overlying skin glue, which will flake off in 1 to 2 weeks.  Underlying incision, she has dissolvable stitches. -Advised the patient that I will call her for phone follow-up in 2 weeks.  At that time, I will inform her of her pathology results -She should take over-the-counter Tylenol and Advil as needed for pain.  She will call the office if she needs something stronger for pain  All of the above recommendations were discussed with the patient, and all of patient's questions were answered to her expressed satisfaction.   In office procedure note   Preoperative Diagnosis: 1 cm left medial breast cyst Postoperative Diagnosis: 1 cm  left medial breast cyst   Procedure(s) Performed: Excision of left medial breast cyst   Performing provider: Santina Evans Saba Neuman, DO   Estimated Blood Loss: Minimal   Findings: 1 cm left medial breast cyst   Procedure: In the office, the patient was sterilely prepped and draped with Betadine.  1% lidocaine with epinephrine was used to localize the incision site.  A linear incision was made overlying the mass. Using a combination of blunt and sharp dissection, the dermis and subcutaneous tissues dissected around the mass. The mass was removed in its entirety and sent to pathology for evaluation. Hemostasis was achieved using electrocautery. The dermis was approximated using 3-0 Vicryl sutures. The skin was closed using 4-0 Monocryl in a running subcuticular fashion.  The incision was dressed with Dermabond. The patient tolerated the procedure without issue.  Theophilus Kinds, DO [facility] [address] [place], [place] [address] [phone] (office)

## 2023-03-26 LAB — PATHOLOGY REPORT

## 2023-03-26 LAB — TISSUE SPECIMEN

## 2023-04-01 ENCOUNTER — Ambulatory Visit (INDEPENDENT_AMBULATORY_CARE_PROVIDER_SITE_OTHER): Payer: BC Managed Care – PPO | Admitting: Surgery

## 2023-04-01 ENCOUNTER — Ambulatory Visit: Payer: BC Managed Care – PPO | Admitting: Internal Medicine

## 2023-04-01 ENCOUNTER — Encounter: Payer: Self-pay | Admitting: Internal Medicine

## 2023-04-01 DIAGNOSIS — L720 Epidermal cyst: Secondary | ICD-10-CM

## 2023-04-01 DIAGNOSIS — J0110 Acute frontal sinusitis, unspecified: Secondary | ICD-10-CM

## 2023-04-01 MED ORDER — FLUTICASONE PROPIONATE 50 MCG/ACT NA SUSP: 2.0000 | Freq: Every day | NASAL | 6 refills | Status: DC

## 2023-04-01 MED ORDER — AMOXICILLIN-POT CLAVULANATE 875-125 MG PO TABS: 1.0000 | ORAL_TABLET | Freq: Two times a day (BID) | ORAL | 0 refills | Status: AC

## 2023-04-01 NOTE — Progress Notes (Signed)
[facility]  I am calling the patient for post operative evaluation. This is a billable encounter, and the patient was notified.  The patient had a left breast/chest cyst excision on 4/11 in office. The patient reports that she is doing well. She has good pain control.  The incision is healing well without issue.  The skin glue has already come off.  She states that the area is a little bumping, and I explained that this is likely related to scar tissue and will soften with time. The patient has no concerns.   Pathology: A Diagnosis   Comment: - Benign cyst compatible with steatocystoma.   Will see the patient PRN.   Theophilus Kinds, DO [facility] [address] [place], [place] [address] [phone] (office)

## 2023-04-01 NOTE — Progress Notes (Signed)
   HPI:[name] is a 62 y.o. female who presents for evaluation of sinus congestion. For the details of today's visit, please refer to the assessment and plan.  Physical Exam: Vitals:   04/01/23 0944  BP: 137/82  Pulse: 75  Resp: 16  Temp: 98.1 F (36.7 C)  TempSrc: Oral  SpO2: 96%  Weight: 223 lb (101.2 kg)  Height:  (1.676 m)     Physical Exam Constitutional:      Appearance: She is not toxic-appearing or diaphoretic.  HENT:     Right Ear: Tympanic membrane normal.     Left Ear: Tympanic membrane normal.     Nose: Congestion present. No rhinorrhea.     Mouth/Throat:     Pharynx: Posterior oropharyngeal erythema present. No oropharyngeal exudate.  Cardiovascular:     Rate and Rhythm: Normal rate and regular rhythm.  Pulmonary:     Effort: Pulmonary effort is normal. No respiratory distress.     Breath sounds: No wheezing.      Assessment & Plan:   [name] was seen today for sinus problem.  Acute non-recurrent frontal sinusitis Overview: Qualifier: Diagnosis of  By: Lodema Hong MD, Margaret    Assessment & Plan: Patient present with a week of congestion in her face and has a headache now.  On Sunday she was having thick mucus secretions and she has tried using Nettie pot without much relief.  She has been using Alka-Seltzer sinus.  She also has been taking Claritin.  She is taken 2 COVID test which were negative.  She denies any fever, malaise, cough, or chest pain.  Her husband was recently diagnosed with acute sinusitis.  Recommend taking Claritin daily Continue nasal rinses daily Start fluticasone, use after nasal rinses Prescribed Augmentin, patient will start if not improving in 2 days with treatment of her allergies    Orders: -     Amoxicillin-Pot Clavulanate; Take 1 tablet by mouth 2 (two) times daily for 7 days.  Dispense: 14 tablet; Refill: 0 -     Fluticasone Propionate; Place 2 sprays into both nostrils daily.  Dispense: 15.8 mL;  Refill: 6      Milus Banister, MD

## 2023-04-01 NOTE — Patient Instructions (Signed)
Thank you, [name] for allowing [place] to provide your care today.   Acute non-recurrent frontal sinusitis - amoxicillin-clavulanate (AUGMENTIN) 875-125 MG tablet; Take 1 tablet by mouth 2 (two) times daily for 7 days.  Dispense: 14 tablet; Refill: 0 - fluticasone (FLONASE) 50 MCG/ACT nasal spray; Place 2 sprays into both nostrils daily.  Dispense: 15.8 mL; Refill: 6   Follow up if symptoms worsen or fail to improve     Thurmon Fair, M.D.

## 2023-04-01 NOTE — Assessment & Plan Note (Addendum)
Patient present with a week of congestion in her face and has a headache now.  On Sunday she was having thick mucus secretions and she has tried using Nettie pot without much relief.  She has been using Alka-Seltzer sinus.  She also has been taking Claritin.  She is taken 2 COVID test which were negative.  She denies any fever, malaise, cough, or chest pain.  Her husband was recently diagnosed with acute sinusitis.  Recommend taking Claritin daily Continue nasal rinses daily Start fluticasone, use after nasal rinses Prescribed Augmentin, patient will start if not improving in 2 days with treatment of her allergies

## 2023-04-02 ENCOUNTER — Other Ambulatory Visit (HOSPITAL_COMMUNITY): Payer: Self-pay

## 2023-05-14 ENCOUNTER — Encounter: Payer: Self-pay | Admitting: Diagnostic Neuroimaging

## 2023-05-14 ENCOUNTER — Ambulatory Visit: Payer: BC Managed Care – PPO | Admitting: Diagnostic Neuroimaging

## 2023-05-14 VITALS — BP 136/78 | HR 71 | Ht 66.0 in | Wt 220.0 lb

## 2023-05-14 DIAGNOSIS — R519 Headache, unspecified: Secondary | ICD-10-CM | POA: Diagnosis not present

## 2023-05-14 NOTE — Progress Notes (Signed)
[facility]  PATIENT: [name] DOB: Apr 09, 1961  REFERRING CLINICIAN: Kerri Perches, MD HISTORY FROM: patient REASON FOR VISIT: new consult   HISTORICAL  CHIEF COMPLAINT:  Chief Complaint  Patient presents with   Headache    Rm 7  Pt is well, reports she has been having occasionally headaches since beginning of 2023. Last headache was Sunday, didn't have one prior to that since January.  She does have TMJ.     HISTORY OF PRESENT ILLNESS:   62 year old female here for evaluation of headaches.  February 2024 patient had onset of right-sided headaches with nausea and sensitive to light and sound.  Also had event in June 2024.  Around this time patient was chewing gum and eating popcorn, leading to pain and tenderness in her right temporomandibular joint.  After stopping these foods symptoms improved.  No prior history of migraine headaches.  Continues to have some occasional mild headaches.   REVIEW OF SYSTEMS: Full 14 system review of systems performed and negative with exception of: As per HPI.  ALLERGIES: Allergies  Allergen Reactions   Sulfamethoxazole-Trimethoprim Nausea Only    HOME MEDICATIONS: Outpatient Medications Prior to Visit  Medication Sig Dispense Refill   amLODipine (NORVASC) 10 MG tablet Take 1 tablet (10 mg total) by mouth daily. 90 tablet 3   Ascorbic Acid (VITAMIN C) 1000 MG tablet Take 1,000 mg by mouth daily.     Biotin 1000 MCG CHEW Chew 1 tablet by mouth daily.     clotrimazole-betamethasone (LOTRISONE) lotion Apply 5 mLs topically 2 (two) times daily.     fluticasone (FLONASE) 50 MCG/ACT nasal spray Place 2 sprays into both nostrils daily. 15.8 mL 6   Magnesium 250 MG TABS Take 250 mg by mouth as needed.     Multiple Vitamin (MULTIVITAMIN) tablet Take 1 tablet by mouth daily.     ergocalciferol (VITAMIN D2) 1.25 MG (50000 UT) capsule Take 50,000 Units by mouth once a week.     No facility-administered medications  prior to visit.    PAST MEDICAL HISTORY: Past Medical History:  Diagnosis Date   Arthritis    Hypertension    Joint pain    Left knee pain    Pain of right clavicle 10/17/2018   Pneumonia    Seasonal allergies     PAST SURGICAL HISTORY: Past Surgical History:  Procedure Laterality Date   BIOPSY  06/29/2021   Procedure: BIOPSY;  Surgeon: Dolores Frame, MD;  Location: [facility];  Service: Gastroenterology;;   CESAREAN SECTION     COLONOSCOPY N/A 09/15/2014   Procedure: COLONOSCOPY;  Surgeon: Malissa Hippo, MD;  Location: [facility];  Service: Endoscopy;  Laterality: N/A;  830   COLONOSCOPY WITH PROPOFOL N/A 06/29/2021   Procedure: COLONOSCOPY WITH PROPOFOL;  Surgeon: Dolores Frame, MD;  Location: [facility];  Service: Gastroenterology;  Laterality: N/A;  9:35   Cyst removed from buttock  08/10/1979   POLYPECTOMY  06/29/2021   Procedure: POLYPECTOMY;  Surgeon: Dolores Frame, MD;  Location: [facility];  Service: Gastroenterology;;   UPPER GI ENDOSCOPY N/A 10/09/2021   Procedure: UPPER GI ENDOSCOPY;  Surgeon: Gaynelle Adu, MD;  Location: [facility];  Service: General;  Laterality: N/A;    FAMILY HISTORY: Family History  Problem Relation Age of Onset   Diabetes Mother    High blood pressure Mother    Colon cancer Neg Hx     SOCIAL HISTORY: Social History   Socioeconomic  History   Marital status: Married    Spouse name: Not on file   Number of children: Not on file   Years of education: Not on file   Highest education level: Not on file  Occupational History   Occupation: Teacher  Tobacco Use   Smoking status: Never   Smokeless tobacco: Never  Vaping Use   Vaping Use: Never used  Substance and Sexual Activity   Alcohol use: No   Drug use: No   Sexual activity: Yes  Other Topics Concern   Not on file  Social History Narrative   Not on file   Social Determinants of Health   Financial Resource Strain: Not on file   Food Insecurity: Not on file  Transportation Needs: Not on file  Physical Activity: Not on file  Stress: Not on file  Social Connections: Not on file  Intimate Partner Violence: Not on file     PHYSICAL EXAM   GENERAL EXAM/CONSTITUTIONAL: Vitals:  Vitals:   05/14/23 1206  BP: 136/78  Pulse: 71  Weight: 220 lb (99.8 kg)  Height: 5\' 6"  (1.676 m)   Body mass index is 35.51 kg/m. Wt Readings from Last 3 Encounters:  05/14/23 220 lb (99.8 kg)  04/01/23 223 lb (101.2 kg)  03/20/23 221 lb (100.2 kg)   Patient is in no distress; well developed, nourished and groomed; neck is supple  CARDIOVASCULAR: Examination of carotid arteries is normal; no carotid bruits Regular rate and rhythm, no murmurs Examination of peripheral vascular system by observation and palpation is normal  EYES: Ophthalmoscopic exam of optic discs and posterior segments is normal; no papilledema or hemorrhages No results found.  MUSCULOSKELETAL: Gait, strength, tone, movements noted in Neurologic exam below  NEUROLOGIC: MENTAL STATUS:      No data to display         awake, alert, oriented to person, place and time recent and remote memory intact normal attention and concentration language fluent, comprehension intact, naming intact fund of knowledge appropriate  CRANIAL NERVE:  2nd - no papilledema on fundoscopic exam 2nd, 3rd, 4th, 6th - pupils equal and reactive to light, visual fields full to confrontation, extraocular muscles intact, no nystagmus 5th - facial sensation symmetric 7th - facial strength symmetric 8th - hearing intact 9th - palate elevates symmetrically, uvula midline 11th - shoulder shrug symmetric 12th - tongue protrusion midline  MOTOR:  normal bulk and tone, full strength in the BUE, BLE  SENSORY:  normal and symmetric to light touch, temperature, vibration  COORDINATION:  finger-nose-finger, fine finger movements normal  REFLEXES:  deep tendon reflexes TRACE  and symmetric  GAIT/STATION:  narrow based gait     DIAGNOSTIC DATA (LABS, IMAGING, TESTING) - I reviewed patient records, labs, notes, testing and imaging myself where available.  Lab Results  Component Value Date   WBC 3.5 12/04/2022   HGB 13.2 12/04/2022   HCT 39.8 12/04/2022   MCV 88 12/04/2022   PLT 290 12/04/2022      Component Value Date/Time   NA 142 12/04/2022 1331   K 4.0 12/04/2022 1331   CL 102 12/04/2022 1331   CO2 23 12/04/2022 1331   GLUCOSE 101 (H) 12/04/2022 1331   GLUCOSE 140 (H) 10/10/2021 0422   BUN 10 12/04/2022 1331   CREATININE 0.71 12/04/2022 1331   CREATININE 0.78 05/11/2020 0801   CALCIUM 10.3 (H) 12/04/2022 1331   PROT 7.0 12/04/2022 1331   ALBUMIN 4.4 12/04/2022 1331   AST 20 12/04/2022 1331  ALT 19 12/04/2022 1331   ALKPHOS 84 12/04/2022 1331   BILITOT 0.3 12/04/2022 1331   GFRNONAA >60 10/10/2021 0422   GFRNONAA 85 05/11/2020 0801   GFRAA 98 05/11/2020 0801   Lab Results  Component Value Date   CHOL 215 (H) 12/04/2022   HDL 77 12/04/2022   LDLCALC 127 (H) 12/04/2022   TRIG 63 12/04/2022   CHOLHDL 2.8 12/04/2022   Lab Results  Component Value Date   HGBA1C 5.1 01/05/2020   Lab Results  Component Value Date   VITAMINB12 1,013 01/12/2020   Lab Results  Component Value Date   TSH 1.580 12/04/2022    01/01/23 MRI brain [I reviewed images myself and agree with interpretation. Non-specific findings. -VRP]  1. No acute intracranial abnormality. 2. Stable few T2 hyperintense lesions of the white matter in a nonspecific pattern. Differential diagnosis include demyelinating disease, migraine headache, and post inflammatory/infectious processes.   ASSESSMENT AND PLAN  62 y.o. year old female here with:  Dx:  1. Right-sided headache       PLAN:  RIGHT SIDED HEADACHES (Feb, June 2024; ddx: TMJ disorder, temporal arteritis, migraine with aura) - check ESR, CRP (temporal arteritis eval; less likely) - to prevent or  relieve headaches, try the following: Cool Compress. Lie down and place a cool compress on your head.   Avoid headache triggers. If certain foods or odors seem to have triggered your migraines in the past, avoid them. A headache diary might help you identify triggers.   Include physical activity in your daily routine.  Manage stress. Find healthy ways to cope with the stressors, such as delegating tasks on your to-do list.   Practice relaxation techniques. Try deep breathing, yoga, massage and visualization.   Eat regularly. Eating regularly scheduled meals and maintaining a healthy diet might help prevent headaches. Also, drink plenty of fluids.   Follow a regular sleep schedule. Sleep deprivation might contribute to headaches Consider biofeedback. With this mind-body technique, you learn to control certain bodily functions -- such as muscle tension, heart rate and blood pressure -- to prevent headaches or reduce headache pain.  MRI BRAIN FINDINGS - mild findings; suspect migraine associated vs chronic small vessel ischemic disease white matter changes  Orders Placed This Encounter  Procedures   Sedimentation Rate   C-reactive Protein   Return for pending if symptoms worsen or fail to improve, pending test results.    Suanne Marker, MD 05/14/2023, 12:38 PM Certified in Neurology, Neurophysiology and Neuroimaging  [facility], [facility] [address] [place], [place] [address] [phone]

## 2023-05-14 NOTE — Patient Instructions (Signed)
  RIGHT SIDED HEADACHES (ddx: TMJ disorder, temporal arteritis, migraine with aura) - check ESR, CRP - to prevent or relieve headaches, try the following: Cool Compress. Lie down and place a cool compress on your head.   Avoid headache triggers. If certain foods or odors seem to have triggered your migraines in the past, avoid them. A headache diary might help you identify triggers.   Include physical activity in your daily routine.  Manage stress. Find healthy ways to cope with the stressors, such as delegating tasks on your to-do list.   Practice relaxation techniques. Try deep breathing, yoga, massage and visualization.   Eat regularly. Eating regularly scheduled meals and maintaining a healthy diet might help prevent headaches. Also, drink plenty of fluids.   Follow a regular sleep schedule. Sleep deprivation might contribute to headaches Consider biofeedback. With this mind-body technique, you learn to control certain bodily functions -- such as muscle tension, heart rate and blood pressure -- to prevent headaches or reduce headache pain.  MRI BRAIN FINDINGS - mild findings; suspect migraine associated vs chronic small vessel ischemic disease white matter changes

## 2023-05-15 LAB — C-REACTIVE PROTEIN: CRP: 2 mg/L (ref 0–10)

## 2023-05-15 LAB — SEDIMENTATION RATE: Sed Rate: 8 mm/hr (ref 0–40)

## 2023-05-23 ENCOUNTER — Ambulatory Visit: Payer: BC Managed Care – PPO | Admitting: Family Medicine

## 2023-06-25 ENCOUNTER — Ambulatory Visit: Payer: BC Managed Care – PPO | Admitting: Family Medicine

## 2023-06-25 ENCOUNTER — Encounter: Payer: Self-pay | Admitting: Family Medicine

## 2023-06-25 VITALS — BP 115/73 | HR 63 | Ht 66.0 in | Wt 224.1 lb

## 2023-06-25 DIAGNOSIS — E669 Obesity, unspecified: Secondary | ICD-10-CM | POA: Diagnosis not present

## 2023-06-25 DIAGNOSIS — E785 Hyperlipidemia, unspecified: Secondary | ICD-10-CM | POA: Diagnosis not present

## 2023-06-25 DIAGNOSIS — I10 Essential (primary) hypertension: Secondary | ICD-10-CM

## 2023-06-25 DIAGNOSIS — E559 Vitamin D deficiency, unspecified: Secondary | ICD-10-CM

## 2023-06-25 DIAGNOSIS — J0110 Acute frontal sinusitis, unspecified: Secondary | ICD-10-CM

## 2023-06-25 DIAGNOSIS — J3089 Other allergic rhinitis: Secondary | ICD-10-CM

## 2023-06-25 DIAGNOSIS — E041 Nontoxic single thyroid nodule: Secondary | ICD-10-CM

## 2023-06-25 DIAGNOSIS — G8929 Other chronic pain: Secondary | ICD-10-CM

## 2023-06-25 DIAGNOSIS — M25561 Pain in right knee: Secondary | ICD-10-CM

## 2023-06-25 MED ORDER — WEGOVY 0.25 MG/0.5ML ~~LOC~~ SOAJ: 0.2500 mg | SUBCUTANEOUS | 0 refills | Status: DC

## 2023-06-25 MED ORDER — FLUTICASONE PROPIONATE 50 MCG/ACT NA SUSP: 2.0000 | Freq: Every day | NASAL | 6 refills | Status: DC

## 2023-06-25 MED ORDER — AMLODIPINE BESYLATE 10 MG PO TABS: 10.0000 mg | ORAL_TABLET | Freq: Every day | ORAL | 3 refills | Status: DC

## 2023-06-25 NOTE — Patient Instructions (Signed)
F/U in 9 weeks, call if you need me sooner  Fasting CBc, lipid, cmp and eGFr, TSH, Vit D , and magnesium  this week  New med wegovy for help with weight loss is prescribed , send a message asap as to whether ins will cover that or similar, I will attempt top get a few weeks of samples if not  Sleep is crucial for health  Regular exercise 30 mins for health and plant based eating is the only healthy way  You need RSV vaccine, get as soon as possible  Get covid vaccine when next available at pharmacy  Thanks for choosing [facility], we consider it a privelige to serve you.

## 2023-06-26 ENCOUNTER — Telehealth: Payer: Self-pay | Admitting: Family Medicine

## 2023-06-26 NOTE — Telephone Encounter (Signed)
Pt called in [facility] is not doing RSV vaccine.

## 2023-06-27 ENCOUNTER — Encounter: Payer: Self-pay | Admitting: Family Medicine

## 2023-06-27 NOTE — Telephone Encounter (Signed)
Noted  

## 2023-06-30 ENCOUNTER — Encounter: Payer: Self-pay | Admitting: Family Medicine

## 2023-06-30 DIAGNOSIS — E559 Vitamin D deficiency, unspecified: Secondary | ICD-10-CM | POA: Insufficient documentation

## 2023-06-30 NOTE — Assessment & Plan Note (Signed)
Deteriorated with weight re gain  Patient re-educated about  the importance of commitment to a  minimum of 150 minutes of exercise per week as able.  The importance of healthy food choices with portion control discussed, as well as eating regularly and within a 12 hour window most days. The need to choose "clean , green" food 50 to 75% of the time is discussed, as well as to make water the primary drink and set a goal of 64 ounces water daily.       06/25/2023    4:27 PM 05/14/2023   12:06 PM 04/01/2023    9:44 AM  Weight /BMI  Weight 224 lb 1.9 oz 220 lb 223 lb  Height 5\' 6"  (1.676 m) 5\' 6"  (1.676 m) 5\' 6"  (1.676 m)  BMI 36.17 kg/m2 35.51 kg/m2 35.99 kg/m2    Samp[les of wegovy x 2 months to be provided

## 2023-06-30 NOTE — Assessment & Plan Note (Signed)
Controlled, no change in medication DASH diet and commitment to daily physical activity for a minimum of 30 minutes discussed and encouraged, as a part of hypertension management. The importance of attaining a healthy weight is also discussed.     06/25/2023    4:27 PM 05/14/2023   12:06 PM 04/01/2023    9:44 AM 03/20/2023    2:58 PM 02/13/2023    2:54 PM 12/19/2022    3:50 PM 12/10/2022    3:31 PM  BP/Weight  Systolic BP 115 136 137 122 130 126 133  Diastolic BP 73 78 82 81 82 76 85  Wt. (Lbs) 224.12 220 223 221 216 208.04 203  BMI 36.17 kg/m2 35.51 kg/m2 35.99 kg/m2 35.67 kg/m2 34.86 kg/m2 33.58 kg/m2 32.77 kg/m2

## 2023-06-30 NOTE — Assessment & Plan Note (Signed)
Check TSH 

## 2023-06-30 NOTE — Assessment & Plan Note (Signed)
Controlled, no change in medication  

## 2023-06-30 NOTE — Assessment & Plan Note (Signed)
Plans on knee surgery in next 6 months

## 2023-06-30 NOTE — Assessment & Plan Note (Signed)
Hyperlipidemia:Low fat diet discussed and encouraged.   Lipid Panel  Lab Results  Component Value Date   CHOL 215 (H) 12/04/2022   HDL 77 12/04/2022   LDLCALC 127 (H) 12/04/2022   TRIG 63 12/04/2022   CHOLHDL 2.8 12/04/2022     Updated lab needed at/ before next visit.

## 2023-06-30 NOTE — Progress Notes (Signed)
[name]     MRN: [id number]      DOB: 01/22/1961  Chief Complaint  Patient presents with   Follow-up    Follow up discuss ozempic    HPI [name] is here for follow up and re-evaluation of chronic medical conditions, medication management and review of any available recent lab and radiology data.  Preventive health is updated, specifically  Cancer screening and Immunization.   Questions or concerns regarding consultations or procedures which the PT has had in the interim are  addressed. The PT denies any adverse reactions to current medications since the last visit.  There are no new concerns.  There are no specific complaints   ROS Denies recent fever or chills. Denies sinus pressure, nasal congestion, ear pain or sore throat. Denies chest congestion, productive cough or wheezing. Denies chest pains, palpitations and leg swelling Denies abdominal pain, nausea, vomiting,diarrhea or constipation.   Denies dysuria, frequency, hesitancy or incontinence.  Denies headaches, seizures, numbness, or tingling. Denies depression, anxiety or insomnia. Denies skin break down or rash.   PE  BP 115/73 (BP Location: Right Arm, Patient Position: Sitting, Cuff Size: Large)   Pulse 63   Ht 5\' 6"  (1.676 m)   Wt 224 lb 1.9 oz (101.7 kg)   LMP 10/19/2011   SpO2 94%   BMI 36.17 kg/m   Patient alert and oriented and in no cardiopulmonary distress.  HEENT: No facial asymmetry, EOMI,     Neck supple .  Chest: Clear to auscultation bilaterally.  CVS: S1, S2 no murmurs, no S3.Regular rate.  ABD: Soft non tender.   Ext: No edema  MS: Adequate ROM spine, shoulders, hips and reduced in  knees.  Skin: Intact, no ulcerations or rash noted.  Psych: Good eye contact, normal affect. Memory intact not anxious or depressed appearing.  CNS: CN 2-12 intact, power,  normal throughout.no focal deficits noted.   Assessment & Plan  HTN (hypertension) Controlled, no change in  medication DASH diet and commitment to daily physical activity for a minimum of 30 minutes discussed and encouraged, as a part of hypertension management. The importance of attaining a healthy weight is also discussed.     06/25/2023    4:27 PM 05/14/2023   12:06 PM 04/01/2023    9:44 AM 03/20/2023    2:58 PM 02/13/2023    2:54 PM 12/19/2022    3:50 PM 12/10/2022    3:31 PM  BP/Weight  Systolic BP 115 136 137 122 130 126 133  Diastolic BP 73 78 82 81 82 76 85  Wt. (Lbs) 224.12 220 223 221 216 208.04 203  BMI 36.17 kg/m2 35.51 kg/m2 35.99 kg/m2 35.67 kg/m2 34.86 kg/m2 33.58 kg/m2 32.77 kg/m2       Obesity (BMI 30.0-34.9) Deteriorated with weight re gain  Patient re-educated about  the importance of commitment to a  minimum of 150 minutes of exercise per week as able.  The importance of healthy food choices with portion control discussed, as well as eating regularly and within a 12 hour window most days. The need to choose "clean , green" food 50 to 75% of the time is discussed, as well as to make water the primary drink and set a goal of 64 ounces water daily.       06/25/2023    4:27 PM 05/14/2023   12:06 PM 04/01/2023    9:44 AM  Weight /BMI  Weight 224 lb 1.9 oz 220 lb 223 lb  Height  5\' 6"  (1.676 m) 5\' 6"  (1.676 m) 5\' 6"  (1.676 m)  BMI 36.17 kg/m2 35.51 kg/m2 35.99 kg/m2    Samp[les of wegovy x 2 months to be provided  Pain in right knee Plans on knee surgery in next 6 months  Allergic rhinitis Controlled, no change in medication   Dyslipidemia Hyperlipidemia:Low fat diet discussed and encouraged.   Lipid Panel  Lab Results  Component Value Date   CHOL 215 (H) 12/04/2022   HDL 77 12/04/2022   LDLCALC 127 (H) 12/04/2022   TRIG 63 12/04/2022   CHOLHDL 2.8 12/04/2022     Updated lab needed at/ before next visit.   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

## 2023-06-30 NOTE — Assessment & Plan Note (Signed)
Updated lab needed at/ before next visit.   

## 2023-07-02 LAB — CMP14+EGFR
ALT: 15 IU/L (ref 0–32)
AST: 17 IU/L (ref 0–40)
Albumin: 4.2 g/dL (ref 3.8–4.9)
Alkaline Phosphatase: 97 IU/L (ref 44–121)
BUN/Creatinine Ratio: 21 (ref 12–28)
BUN: 16 mg/dL (ref 8–27)
Bilirubin Total: 0.3 mg/dL (ref 0.0–1.2)
CO2: 24 mmol/L (ref 20–29)
Calcium: 9.9 mg/dL (ref 8.7–10.3)
Chloride: 105 mmol/L (ref 96–106)
Creatinine, Ser: 0.76 mg/dL (ref 0.57–1.00)
Globulin, Total: 2.6 g/dL (ref 1.5–4.5)
Glucose: 80 mg/dL (ref 70–99)
Potassium: 4.3 mmol/L (ref 3.5–5.2)
Sodium: 144 mmol/L (ref 134–144)
Total Protein: 6.8 g/dL (ref 6.0–8.5)
eGFR: 90 mL/min/{1.73_m2} (ref >59)

## 2023-07-02 LAB — CBC
Hematocrit: 37.8 % (ref 34.0–46.6)
Hemoglobin: 12.5 g/dL (ref 11.1–15.9)
MCH: 30.9 pg (ref 26.6–33.0)
MCHC: 33.1 g/dL (ref 31.5–35.7)
MCV: 93 fL (ref 79–97)
Platelets: 248 10*3/uL (ref 150–450)
RBC: 4.05 x10E6/uL (ref 3.77–5.28)
RDW: 11.5 % — ABNORMAL LOW (ref 11.7–15.4)
WBC: 5.9 10*3/uL (ref 3.4–10.8)

## 2023-07-02 LAB — LIPID PANEL
Chol/HDL Ratio: 2.4 ratio (ref 0.0–4.4)
Cholesterol, Total: 186 mg/dL (ref 100–199)
HDL: 76 mg/dL (ref >39)
LDL Chol Calc (NIH): 100 mg/dL — ABNORMAL HIGH (ref 0–99)
Triglycerides: 52 mg/dL (ref 0–149)
VLDL Cholesterol Cal: 10 mg/dL (ref 5–40)

## 2023-07-02 LAB — MAGNESIUM: Magnesium: 2.2 mg/dL (ref 1.6–2.3)

## 2023-07-02 LAB — TSH: TSH: 2.52 u[IU]/mL (ref 0.450–4.500)

## 2023-07-02 LAB — VITAMIN D 25 HYDROXY (VIT D DEFICIENCY, FRACTURES): Vit D, 25-Hydroxy: 65.1 ng/mL (ref 30.0–100.0)

## 2023-07-04 ENCOUNTER — Ambulatory Visit: Payer: BC Managed Care – PPO | Admitting: Family Medicine

## 2023-07-23 ENCOUNTER — Ambulatory Visit: Payer: BC Managed Care – PPO | Admitting: Family Medicine

## 2023-07-25 ENCOUNTER — Ambulatory Visit: Payer: BC Managed Care – PPO | Admitting: Family Medicine

## 2023-09-01 ENCOUNTER — Other Ambulatory Visit: Payer: Self-pay

## 2023-09-01 ENCOUNTER — Emergency Department (HOSPITAL_COMMUNITY)
Admission: EM | Admit: 2023-09-01 | Discharge: 2023-09-01 | Disposition: A | Discharge status: Home / Self Care | Payer: BC Managed Care – PPO | Attending: Emergency Medicine | Admitting: Emergency Medicine

## 2023-09-01 ENCOUNTER — Encounter (HOSPITAL_COMMUNITY): Payer: Self-pay | Admitting: Emergency Medicine

## 2023-09-01 ENCOUNTER — Emergency Department (HOSPITAL_COMMUNITY): Payer: BC Managed Care – PPO

## 2023-09-01 DIAGNOSIS — R109 Unspecified abdominal pain: Secondary | ICD-10-CM | POA: Diagnosis present

## 2023-09-01 DIAGNOSIS — I10 Essential (primary) hypertension: Secondary | ICD-10-CM | POA: Diagnosis not present

## 2023-09-01 DIAGNOSIS — R1084 Generalized abdominal pain: Secondary | ICD-10-CM

## 2023-09-01 DIAGNOSIS — K5900 Constipation, unspecified: Secondary | ICD-10-CM | POA: Diagnosis not present

## 2023-09-01 LAB — BASIC METABOLIC PANEL
Anion gap: 9 (ref 5–15)
BUN: 10 mg/dL (ref 6–20)
CO2: 28 mmol/L (ref 22–32)
Calcium: 9.5 mg/dL (ref 8.9–10.3)
Chloride: 103 mmol/L (ref 98–111)
Creatinine, Ser: 0.66 mg/dL (ref 0.44–1.00)
GFR, Estimated: >60 mL/min (ref >60)
Glucose, Bld: 104 mg/dL — ABNORMAL HIGH (ref 70–99)
Potassium: 3.5 mmol/L (ref 3.5–5.1)
Sodium: 140 mmol/L (ref 135–145)

## 2023-09-01 LAB — CBC
HCT: 39.8 % (ref 36.0–46.0)
Hemoglobin: 12.7 g/dL (ref 12.0–15.0)
MCH: 30.5 pg (ref 26.0–34.0)
MCHC: 31.9 g/dL (ref 30.0–36.0)
MCV: 95.7 fL (ref 80.0–100.0)
Platelets: 273 10*3/uL (ref 150–400)
RBC: 4.16 MIL/uL (ref 3.87–5.11)
RDW: 12.2 % (ref 11.5–15.5)
WBC: 3.6 10*3/uL — ABNORMAL LOW (ref 4.0–10.5)
nRBC: 0 % (ref 0.0–0.2)

## 2023-09-01 LAB — URINALYSIS, ROUTINE W REFLEX MICROSCOPIC
Bilirubin Urine: NEGATIVE
Glucose, UA: NEGATIVE mg/dL
Hgb urine dipstick: NEGATIVE
Ketones, ur: 5 mg/dL — AB
Leukocytes,Ua: NEGATIVE
Nitrite: NEGATIVE
Protein, ur: NEGATIVE mg/dL
Specific Gravity, Urine: 1.008 (ref 1.005–1.030)
pH: 7 (ref 5.0–8.0)

## 2023-09-01 MED ORDER — ACETAMINOPHEN 325 MG PO TABS
650.0000 mg | ORAL_TABLET | Freq: Once | ORAL | Status: AC
  Administered 2023-09-01: 650 mg via ORAL
  Filled 2023-09-01: qty 2

## 2023-09-01 MED ORDER — IOHEXOL 300 MG/ML  SOLN
100.0000 mL | Freq: Once | INTRAMUSCULAR | Status: AC | PRN
  Administered 2023-09-01: 100 mL via INTRAVENOUS

## 2023-09-01 NOTE — ED Provider Notes (Signed)
[place] EMERGENCY DEPARTMENT AT [facility] Provider Note   CSN: [id number] Arrival date & time: 09/01/23  1121     History  Chief Complaint  Patient presents with   Flank Pain    Right    [name] is a 62 y.o. female.  Patient with history of hypertension, obesity, laparoscopic sleeve gastrectomy presents today with complaints of abdominal pain.  She states that same began about a week and a half ago and has been persistent since onset.  Her pain is in her right lower quadrant and does not radiate.  She states that she recently began [facility] for weight loss and has had issues with constipation since then.  She recently received her second injection of this medication.  She states she is having regular bowel movements but they are small and hard and requires significant straining.  She denies any hematochezia or melena.  She does note she has had a colonoscopy previously that was normal.  Denies any urinary symptoms or vaginal discharge.  Denies any nausea, vomiting, or diarrhea. She is passing normal flatus.   The history is provided by the patient. No language interpreter was used.  Flank Pain Associated symptoms include abdominal pain.       Home Medications Prior to Admission medications   Medication Sig Start Date End Date Taking? Authorizing Provider  amLODipine (NORVASC) 10 MG tablet Take 1 tablet (10 mg total) by mouth daily. 06/25/23   Kerri Perches, MD  Ascorbic Acid (VITAMIN C) 1000 MG tablet Take 1,000 mg by mouth daily.    [provider]  Biotin 1000 MCG CHEW Chew 1 tablet by mouth daily.    [provider]  clotrimazole-betamethasone (LOTRISONE) lotion Apply 5 mLs topically 2 (two) times daily.    [provider]  fluticasone (FLONASE) 50 MCG/ACT nasal spray Place 2 sprays into both nostrils daily. 06/25/23   Kerri Perches, MD  Magnesium 250 MG TABS Take 250 mg by mouth as needed.    [provider]   Multiple Vitamin (MULTIVITAMIN) tablet Take 1 tablet by mouth daily.    [provider]  Semaglutide-Weight Management (WEGOVY) 0.25 MG/0.5ML SOAJ Inject 0.25 mg into the skin once a week. 06/25/23   Kerri Perches, MD      Allergies    Sulfamethoxazole-trimethoprim    Review of Systems   Review of Systems  Gastrointestinal:  Positive for abdominal pain.  All other systems reviewed and are negative.   Physical Exam Updated Vital Signs BP (!) 145/83   Pulse 80   Temp 98.6 F (37 C)   Resp 20   Ht 5\' 6"  (1.676 m)   Wt 103.9 kg   LMP 10/19/2011   SpO2 100%   BMI 36.96 kg/m  Physical Exam Vitals and nursing note reviewed.  Constitutional:      General: She is not in acute distress.    Appearance: Normal appearance. She is normal weight. She is not ill-appearing, toxic-appearing or diaphoretic.  HENT:     Head: Normocephalic and atraumatic.  Cardiovascular:     Rate and Rhythm: Normal rate.  Pulmonary:     Effort: Pulmonary effort is normal. No respiratory distress.  Abdominal:     General: Abdomen is flat.     Palpations: Abdomen is soft.     Tenderness: There is abdominal tenderness in the right lower quadrant. There is no guarding or rebound.  Musculoskeletal:        General: Normal  range of motion.     Cervical back: Normal range of motion.  Skin:    General: Skin is warm and dry.  Neurological:     General: No focal deficit present.     Mental Status: She is alert.  Psychiatric:        Mood and Affect: Mood normal.        Behavior: Behavior normal.     ED Results / Procedures / Treatments   Labs (all labs ordered are listed, but only abnormal results are displayed) Labs Reviewed  URINALYSIS, ROUTINE W REFLEX MICROSCOPIC - Abnormal; Notable for the following components:      Result Value   Ketones, ur 5 (*)    All other components within normal limits  BASIC METABOLIC PANEL - Abnormal; Notable for the following components:   Glucose, Bld  104 (*)    All other components within normal limits  CBC - Abnormal; Notable for the following components:   WBC 3.6 (*)    All other components within normal limits    EKG None  Radiology CT ABDOMEN PELVIS W CONTRAST  Result Date: 09/01/2023 CLINICAL DATA:  Acute abdominal pain EXAM: CT ABDOMEN AND PELVIS WITH CONTRAST TECHNIQUE: Multidetector CT imaging of the abdomen and pelvis was performed using the standard protocol following bolus administration of intravenous contrast. RADIATION DOSE REDUCTION: This exam was performed according to the departmental dose-optimization program which includes automated exposure control, adjustment of the mA and/or kV according to patient size and/or use of iterative reconstruction technique. CONTRAST:  OMNIPAQUE IOHEXOL 300 MG/ML  SOLN COMPARISON:  CT abdomen and pelvis 02/16/2021 FINDINGS: Lower chest: No acute abnormality. Hepatobiliary: No focal liver abnormality is seen. No gallstones, gallbladder wall thickening, or biliary dilatation. Pancreas: Unremarkable. No pancreatic ductal dilatation or surrounding inflammatory changes. Spleen: Normal in size without focal abnormality. Adrenals/Urinary Tract: There is a 4.6 cm cyst in the right kidney. There is an additional rounded hypodensity in the right kidney which is too small to characterize, also likely a cyst. Otherwise, the adrenal glands, kidneys and bladder are within normal limits. Stomach/Bowel: There are postsurgical changes in the stomach. Appendix appears normal. No evidence of bowel wall thickening, distention, or inflammatory changes. Vascular/Lymphatic: No significant vascular findings are present. No enlarged abdominal or pelvic lymph nodes. Reproductive: Posterior uterine fibroid is again seen measuring 2.1 cm. Adnexa are within normal limits. Other: No abdominal wall hernia or abnormality. No abdominopelvic ascites. Musculoskeletal: No acute or significant osseous findings. IMPRESSION: 1. No  acute localizing process in the abdomen or pelvis. 2. Uterine fibroid. 3. Right renal cyst.  No follow-up imaging recommended.  b Electronically Signed   By: Darliss Cheney M.D.   On: 09/01/2023 18:32    Procedures Procedures    Medications Ordered in ED Medications  acetaminophen (TYLENOL) tablet 650 mg (650 mg Oral Given 09/01/23 1351)  iohexol (OMNIPAQUE) 300 MG/ML solution 100 mL (100 mLs Intravenous Contrast Given 09/01/23 1639)    ED Course/ Medical Decision Making/ A&P                                 Medical Decision Making Amount and/or Complexity of Data Reviewed Labs: ordered. Radiology: ordered.  Risk OTC drugs. Prescription drug management.   This patient is a 62 y.o. female who presents to the ED for concern of abdominal pain, this involves an extensive number of treatment options, and is a complaint that  carries with it a high risk of complications and morbidity. The emergent differential diagnosis prior to evaluation includes, but is not limited to,  AAA, gastroenteritis, appendicitis, Bowel obstruction, Bowel perforation. Gastroparesis, DKA, Hernia, Inflammatory bowel disease, mesenteric ischemia, pancreatitis, peritonitis SBP, volvulus.    This is not an exhaustive differential.   Past Medical History / Co-morbidities / Social History:  has a past medical history of Arthritis, Hypertension, Joint pain, Left knee pain, Pain of right clavicle (10/17/2018), Pneumonia, and Seasonal allergies.  Additional history: Chart reviewed.  Physical Exam: Physical exam performed. The pertinent findings include: right sided abdominal tenderness to palpation  Lab Tests: I ordered, and personally interpreted labs.  The pertinent results include:  no acute laboratory abnormalities   Imaging Studies: I ordered imaging studies including CT abdomen pelvis. I independently visualized and interpreted imaging which showed   1. No acute localizing process in the abdomen or  pelvis. 2. Uterine fibroid. 3. Right renal cyst.  No follow-up imaging recommended.  I agree with the radiologist interpretation.   Medications: I ordered medication including tylenol  for pain per patients request. Reevaluation of the patient after these medicines showed that the patient improved. I have reviewed the patients home medicines and have made adjustments as needed.  Disposition: After consideration of the diagnostic results and the patients response to treatment, I feel that emergency department workup does not suggest an emergent condition requiring admission or immediate intervention beyond what has been performed at this time. The plan is: discharge with close outpatient follow-up and return precautions.  Patient's workup is benign today.  Upon personal evaluation of the patient's CT scan it does appear that she has some constipation.  This is consistent with the patient's story of having small hard stools recently.  Symptoms likely due to the [facility] she is recently started on.  Discussed this with patient who is understanding and in agreement with this.  Recommend MiraLAX bowel cleanout and close outpatient follow-up.  Instructions given for same. Evaluation and diagnostic testing in the emergency department does not suggest an emergent condition requiring admission or immediate intervention beyond what has been performed at this time.  Plan for discharge with close PCP follow-up.  Patient is understanding and amenable with plan, educated on red flag symptoms that would prompt immediate return.  Patient discharged in stable condition.  Final Clinical Impression(s) / ED Diagnoses Final diagnoses:  Constipation, unspecified constipation type  Generalized abdominal pain    Rx / DC Orders ED Discharge Orders     None     An After Visit Summary was printed and given to the patient.     Vear Clock 09/01/23 2105    Pricilla Loveless, MD 09/08/23 [id number]

## 2023-09-01 NOTE — Discharge Instructions (Signed)
As we discussed, your workup in the ER was reassuring for acute findings.  Laboratory evaluation and CT imaging did not reveal any emergent etiology of her symptoms.  However, upon personal evaluation of your CT scan I suspect that your symptoms are due to constipation.  For this, I recommend that you do a MiraLAX bowel cleanout which involves 8 capfuls of MiraLAX in Gatorade. Drink this in 2 hours on a day where you dont have plans and expect significant bowel movements throughout the day ending in liquid stools. You may continue forward with 1 capfull of Miralax twice a day if difficulties with bowel movements continue.  You can also try an Ex-Lax chocolate and a fleets enema as well if you need to.  All of these medications you can get over-the-counter at your local pharmacy.  Please call your primary doctor schedule follow-up appointment as well.  Return if development of any new or worsening symptoms.

## 2023-09-01 NOTE — ED Triage Notes (Signed)
Pt presents with right flank pain, with constipation x 1.5 weeks, no urinary symptoms.

## 2023-09-09 ENCOUNTER — Ambulatory Visit: Payer: BC Managed Care – PPO | Admitting: Family Medicine

## 2023-09-09 VITALS — BP 118/73 | HR 72 | Ht 66.0 in | Wt 219.1 lb

## 2023-09-09 DIAGNOSIS — E785 Hyperlipidemia, unspecified: Secondary | ICD-10-CM | POA: Diagnosis not present

## 2023-09-09 DIAGNOSIS — E66811 Obesity, class 1: Secondary | ICD-10-CM

## 2023-09-09 DIAGNOSIS — K5903 Drug induced constipation: Secondary | ICD-10-CM

## 2023-09-09 DIAGNOSIS — I10 Essential (primary) hypertension: Secondary | ICD-10-CM

## 2023-09-09 DIAGNOSIS — G8929 Other chronic pain: Secondary | ICD-10-CM

## 2023-09-09 DIAGNOSIS — R1011 Right upper quadrant pain: Secondary | ICD-10-CM

## 2023-09-09 DIAGNOSIS — Z23 Encounter for immunization: Secondary | ICD-10-CM | POA: Diagnosis not present

## 2023-09-09 DIAGNOSIS — R1013 Epigastric pain: Secondary | ICD-10-CM | POA: Diagnosis not present

## 2023-09-09 DIAGNOSIS — M1712 Unilateral primary osteoarthritis, left knee: Secondary | ICD-10-CM

## 2023-09-09 NOTE — Patient Instructions (Addendum)
F/U in 3 months, call if tyou neeed me sooner  Flu vaccine today  Fasting lipid, cmp and eGFR 3 to 5 days before next appointment  Need mammogram  Please get covid vaccine at your p[harm[facility]  You are referred for [place] of gallbladder and also to se GI Specialist  Work on n bowel movements daily, senokot s one daily, colace 1 to 2 daily and on day 3 if no bM and mild laxative of your choice  Exercise and water commitments go a long way also  Congrats on weight loss, continue wegovy 2.5 mg weekly if able to tolerate it  Thanks for choosing [facility], we consider it a privelige to serve you.

## 2023-09-10 DIAGNOSIS — G8929 Other chronic pain: Secondary | ICD-10-CM | POA: Insufficient documentation

## 2023-09-10 DIAGNOSIS — K5900 Constipation, unspecified: Secondary | ICD-10-CM | POA: Insufficient documentation

## 2023-09-10 DIAGNOSIS — R1013 Epigastric pain: Secondary | ICD-10-CM | POA: Insufficient documentation

## 2023-09-10 NOTE — Assessment & Plan Note (Signed)
Controlled, no change in medication DASH diet and commitment to daily physical activity for a minimum of 30 minutes discussed and encouraged, as a part of hypertension management. The importance of attaining a healthy weight is also discussed.     09/09/2023    4:08 PM 09/01/2023    7:12 PM 09/01/2023    4:52 PM 09/01/2023    4:51 PM 09/01/2023    4:50 PM 09/01/2023   12:08 PM 09/01/2023   12:06 PM  BP/Weight  Systolic BP 118 138 145 145 145 142   Diastolic BP 73 70 83 83 83 81   Wt. (Lbs) 219.12      229  BMI 35.37 kg/m2      36.96 kg/m2

## 2023-09-10 NOTE — Assessment & Plan Note (Signed)
Debilitating, ready for surgery as soon as her weight is appropriate

## 2023-09-10 NOTE — Assessment & Plan Note (Signed)
  Patient re-educated about  the importance of commitment to a  minimum of 150 minutes of exercise per week as able.  The importance of healthy food choices with portion control discussed, as well as eating regularly and within a 12 hour window most days. The need to choose "clean , green" food 50 to 75% of the time is discussed, as well as to make water the primary drink and set a goal of 64 ounces water daily.       09/09/2023    4:08 PM 09/01/2023   12:06 PM 06/25/2023    4:27 PM  Weight /BMI  Weight 219 lb 1.9 oz 229 lb 224 lb 1.9 oz  Height 5\' 6"  (1.676 m) 5\' 6"  (1.676 m) 5\' 6"  (1.676 m)  BMI 35.37 kg/m2 36.96 kg/m2 36.17 kg/m2    Excellent response to wegovy, stay on lowest dose, will monitor to see if able to toleratedrug due to GI complaints,

## 2023-09-10 NOTE — Assessment & Plan Note (Signed)
Suspect that constipation is induced by [facility] however started 4 weeks after taking medication, does report less exercise and intentional food choice. Thinks her prob started since gastric bypass Constellation of bothersome GI symptoms, will refer to GI also Reviewed dily use of fiber and stool softener alsolaxative gentle every 3 days if needed and attention to lifestyle

## 2023-09-10 NOTE — Progress Notes (Signed)
[name]     MRN: [id number]      DOB: November 20, 1961  Chief Complaint  Patient presents with   Follow-up    Follow up stomach pain comes and goes seen at ER 09/23 for constipation     HPI [name] is here for follow up and re-evaluation of chronic medical conditions, medication management and review of any available recent lab and radiology data.  Preventive health is updated, specifically  Cancer screening and Immunization.   Recent ED visit reviewed with pt Excellent weight loss withwegovy, not certain she can tolerate it however   New c/o RUQ pain awakening her, RUQ pain intermittently x 2 to 3 weeks, and constipation x 5 weeks   ROS Denies recent fever or chills. Denies sinus pressure, nasal congestion, ear pain or sore throat. Denies chest congestion, productive cough or wheezing. Denies chest pains, palpitations and leg swelling    Denies dysuria, frequency, hesitancy or incontinence. Left knee pain and limitation in mobility, wants replacement  Denies headaches, seizures, numbness, or tingling. Denies depression, anxiety or insomnia. Denies skin break down or rash.   PE  BP 118/73 (BP Location: Right Arm, Patient Position: Sitting, Cuff Size: Large)   Pulse 72   Ht 5\' 6"  (1.676 m)   Wt 219 lb 1.9 oz (99.4 kg)   LMP 10/19/2011   SpO2 98%   BMI 35.37 kg/m   Patient alert and oriented and in no cardiopulmonary distress.  HEENT: No facial asymmetry, EOMI,     Neck supple .  Chest: Clear to auscultation bilaterally.  CVS: S1, S2 no murmurs, no S3.Regular rate.  ABD: Soft mild epigastric tenderness, no guarding or rebound, no organomegaly or mass, normal BS.   Ext: No edema  MS: Adequate ROM spine, shoulders, hips and reduced in  knees.  Skin: Intact, no ulcerations or rash noted.  Psych: Good eye contact, normal affect. Memory intact not anxious or depressed appearing.  CNS: CN 2-12 intact, power,  normal throughout.no focal deficits  noted.   Assessment & Plan  Chronic RUQ pain Intermittent RUQ pain x approx 2 weeks, awakened pt from sleep on more than one occasion, was in ED, no abn seen on abdominal scan to expalin, needs [place] gall bladder , and will also refer to GI as has some epigastric discomfort on palpation  Epigastric discomfort Epigastric discomfort to palpation with RUQ pain and constipation refer GI  Constipation Suspect that constipation is induced by [facility] however started 4 weeks after taking medication, does report less exercise and intentional food choice. Thinks her prob started since gastric bypass Constellation of bothersome GI symptoms, will refer to GI also Reviewed dily use of fiber and stool softener alsolaxative gentle every 3 days if needed and attention to lifestyle   HTN (hypertension) Controlled, no change in medication DASH diet and commitment to daily physical activity for a minimum of 30 minutes discussed and encouraged, as a part of hypertension management. The importance of attaining a healthy weight is also discussed.     09/09/2023    4:08 PM 09/01/2023    7:12 PM 09/01/2023    4:52 PM 09/01/2023    4:51 PM 09/01/2023    4:50 PM 09/01/2023   12:08 PM 09/01/2023   12:06 PM  BP/Weight  Systolic BP 118 138 145 145 145 142   Diastolic BP 73 70 83 83 83 81   Wt. (Lbs) 219.12      229  BMI 35.37 kg/m2  36.96 kg/m2       Dyslipidemia Hyperlipidemia:Low fat diet discussed and encouraged.   Lipid Panel  Lab Results  Component Value Date   CHOL 186 07/01/2023   HDL 76 07/01/2023   LDLCALC 100 (H) 07/01/2023   TRIG 52 07/01/2023   CHOLHDL 2.4 07/01/2023     Updated lab needed at/ before next visit.   Obesity (BMI 30.0-34.9)  Patient re-educated about  the importance of commitment to a  minimum of 150 minutes of exercise per week as able.  The importance of healthy food choices with portion control discussed, as well as eating regularly and within a 12 hour window  most days. The need to choose "clean , green" food 50 to 75% of the time is discussed, as well as to make water the primary drink and set a goal of 64 ounces water daily.       09/09/2023    4:08 PM 09/01/2023   12:06 PM 06/25/2023    4:27 PM  Weight /BMI  Weight 219 lb 1.9 oz 229 lb 224 lb 1.9 oz  Height 5\' 6"  (1.676 m) 5\' 6"  (1.676 m) 5\' 6"  (1.676 m)  BMI 35.37 kg/m2 36.96 kg/m2 36.17 kg/m2    Excellent response to wegovy, stay on lowest dose, will monitor to see if able to toleratedrug due to GI complaints,   Osteoarthritis of left knee Debilitating, ready for surgery as soon as her weight is appropriate

## 2023-09-10 NOTE — Assessment & Plan Note (Signed)
Intermittent RUQ pain x approx 2 weeks, awakened pt from sleep on more than one occasion, was in ED, no abn seen on abdominal scan to expalin, needs [place] gall bladder , and will also refer to GI as has some epigastric discomfort on palpation

## 2023-09-10 NOTE — Assessment & Plan Note (Signed)
Hyperlipidemia:Low fat diet discussed and encouraged.   Lipid Panel  Lab Results  Component Value Date   CHOL 186 07/01/2023   HDL 76 07/01/2023   LDLCALC 100 (H) 07/01/2023   TRIG 52 07/01/2023   CHOLHDL 2.4 07/01/2023     Updated lab needed at/ before next visit.

## 2023-09-10 NOTE — Assessment & Plan Note (Signed)
Epigastric discomfort to palpation with RUQ pain and constipation refer GI

## 2023-09-15 LAB — HM MAMMOGRAPHY

## 2023-09-18 ENCOUNTER — Encounter (INDEPENDENT_AMBULATORY_CARE_PROVIDER_SITE_OTHER): Payer: Self-pay | Admitting: Gastroenterology

## 2023-09-18 ENCOUNTER — Ambulatory Visit (INDEPENDENT_AMBULATORY_CARE_PROVIDER_SITE_OTHER): Payer: BC Managed Care – PPO | Admitting: Gastroenterology

## 2023-09-18 VITALS — BP 119/68 | HR 78 | Temp 97.5°F | Ht 66.0 in | Wt 222.9 lb

## 2023-09-18 DIAGNOSIS — T50995A Adverse effect of other drugs, medicaments and biological substances, initial encounter: Secondary | ICD-10-CM | POA: Diagnosis not present

## 2023-09-18 DIAGNOSIS — M940 Chondrocostal junction syndrome [Tietze]: Secondary | ICD-10-CM

## 2023-09-18 DIAGNOSIS — K5903 Drug induced constipation: Secondary | ICD-10-CM | POA: Diagnosis not present

## 2023-09-18 MED ORDER — NAPROXEN 500 MG PO TABS
500.0000 mg | ORAL_TABLET | Freq: Two times a day (BID) | ORAL | 0 refills | Status: DC
Start: 2023-09-18 — End: 2024-04-28

## 2023-09-18 NOTE — Patient Instructions (Addendum)
Start taking naproxen 500 mg twice a day Can use lidocaine patches in the affected area every 12 hours Avoid restarting [facility]

## 2023-09-18 NOTE — Progress Notes (Signed)
Katrinka Blazing, M.D. Gastroenterology & Hepatology [facility] Gastroenterology [address] [place], [place] [address]  Primary Care Physician: Kerri Perches, MD [address] [place] [address]  I will communicate my assessment and recommendations to the referring MD via EMR.  Problems: Right upper quadrant pain  History of Present Illness: [name] is a 62 y.o. female with past medical history arthritis, hypertension, obesity s/p sleeve gastrectomy and hiatal hernia repair, who presents for evaluation of abdominal pain.  Patient reports that possibly on 9/20, she has presented a nagging  discomfort in her RUQ and in the back. She reports that she was having he discomfort when she lays down on her RUQ. It  was keeping her awake during the night.  Patient was started on Wegovy in early September. States that after a couple of weeks she presented significant constipation. Was also having constipation. She states that even before starting [facility] she had small amount of Bms, although she was having daily Bms. She has stopped Wegovy and her constipation has improved. She states nausea has resolved and the nagging discomfort is better, as she can sleep through the night.  The patient denies having any nausea, vomiting, fever, chills, hematochezia, melena, hematemesis, abdominal distention, diarrhea, jaundice, pruritus. Has lost 100 lb since her weight loss surgery.  Last EGD: unclear if she had it around the time of her sleeve gastrectomy Last Colonoscopy: 06/29/2021 A 2 mm polyp was found in the ascending colon. The polyp was sessile. The polyp was removed with a cold biopsy forceps. Resection and retrieval were complete.  A 4 mm polyp was found in the transverse colon. The polyp was sessile. The polyp was removed with a cold snare. Resection and retrieval were complete.  Non- bleeding internal hemorrhoids were found during  retroflexion. The hemorrhoids were small.  Pathology showed two tubular adenoma, repeat colonoscopy in 7 years  Past Medical History: Past Medical History:  Diagnosis Date   Arthritis    Hypertension    Joint pain    Left knee pain    Pain of right clavicle 10/17/2018   Pneumonia    Seasonal allergies     Past Surgical History: Past Surgical History:  Procedure Laterality Date   BIOPSY  06/29/2021   Procedure: BIOPSY;  Surgeon: Dolores Frame, MD;  Location: [facility];  Service: Gastroenterology;;   CESAREAN SECTION     COLONOSCOPY N/A 09/15/2014   Procedure: COLONOSCOPY;  Surgeon: Malissa Hippo, MD;  Location: [facility];  Service: Endoscopy;  Laterality: N/A;  830   COLONOSCOPY WITH PROPOFOL N/A 06/29/2021   Procedure: COLONOSCOPY WITH PROPOFOL;  Surgeon: Dolores Frame, MD;  Location: [facility];  Service: Gastroenterology;  Laterality: N/A;  9:35   Cyst removed from buttock  08/10/1979   POLYPECTOMY  06/29/2021   Procedure: POLYPECTOMY;  Surgeon: Dolores Frame, MD;  Location: [facility];  Service: Gastroenterology;;   UPPER GI ENDOSCOPY N/A 10/09/2021   Procedure: UPPER GI ENDOSCOPY;  Surgeon: Gaynelle Adu, MD;  Location: [facility];  Service: General;  Laterality: N/A;    Family History: Family History  Problem Relation Age of Onset   Diabetes Mother    High blood pressure Mother    Colon cancer Neg Hx     Social History: Social History   Tobacco Use  Smoking Status Never  Smokeless Tobacco Never   Social History   Substance and Sexual Activity  Alcohol  Use No   Social History   Substance and Sexual Activity  Drug Use No    Allergies: Allergies  Allergen Reactions   Sulfamethoxazole-Trimethoprim Nausea Only    Medications: Current Outpatient Medications  Medication Sig Dispense Refill   amLODipine (NORVASC) 10 MG tablet Take 1 tablet (10 mg total) by mouth daily. 90 tablet 3   Ascorbic Acid (VITAMIN C)  1000 MG tablet Take 1,000 mg by mouth daily.     Biotin 1000 MCG CHEW Chew 1 tablet by mouth daily.     fluticasone (FLONASE) 50 MCG/ACT nasal spray Place 2 sprays into both nostrils daily. 15.8 mL 6   Magnesium 250 MG TABS Take 250 mg by mouth as needed.     Multiple Vitamin (MULTIVITAMIN) tablet Take 1 tablet by mouth daily.     OVER THE COUNTER MEDICATION Vit D once per day B12 once per day Calcium daily.     Semaglutide-Weight Management (WEGOVY) 0.25 MG/0.5ML SOAJ Inject 0.25 mg into the skin once a week. 2 mL 0   No current facility-administered medications for this visit.    Review of Systems: GENERAL: negative for malaise, night sweats HEENT: No changes in hearing or vision, no nose bleeds or other nasal problems. NECK: Negative for lumps, goiter, pain and significant neck swelling RESPIRATORY: Negative for cough, wheezing CARDIOVASCULAR: Negative for chest pain, leg swelling, palpitations, orthopnea GI: SEE HPI MUSCULOSKELETAL: Negative for joint pain or swelling, back pain, and muscle pain. SKIN: Negative for lesions, rash PSYCH: Negative for sleep disturbance, mood disorder and recent psychosocial stressors. HEMATOLOGY Negative for prolonged bleeding, bruising easily, and swollen nodes. ENDOCRINE: Negative for cold or heat intolerance, polyuria, polydipsia and goiter. NEURO: negative for tremor, gait imbalance, syncope and seizures. The remainder of the review of systems is noncontributory.   Physical Exam: BP 119/68 (BP Location: Left Arm, Patient Position: Sitting, Cuff Size: Large)   Pulse 78   Temp (!) 97.5 F (36.4 C) (Temporal)   Ht 5\' 6"  (1.676 m)   Wt 222 lb 14.4 oz (101.1 kg)   LMP 10/19/2011   BMI 35.98 kg/m  GENERAL: The patient is AO x3, in no acute distress. HEENT: Head is normocephalic and atraumatic. EOMI are intact. Mouth is well hydrated and without lesions. NECK: Supple. No masses LUNGS: Clear to auscultation. No presence of  rhonchi/wheezing/rales. Adequate chest expansion CHEST: Mildly tender upon palpation of the lower rib cage on the right side HEART: RRR, normal s1 and s2. ABDOMEN: Soft, nontender, no guarding, no peritoneal signs, and nondistended. BS +. No masses. EXTREMITIES: Without any cyanosis, clubbing, rash, lesions or edema. NEUROLOGIC: AOx3, no focal motor deficit. SKIN: no jaundice, no rashes  Imaging/Labs: as above  I personally reviewed and interpreted the available labs, imaging and endoscopic files.  Impression and Plan: [name] is a 62 y.o. female with past medical history arthritis, hypertension, obesity s/p sleeve gastrectomy and hiatal hernia repair, who presents for evaluation of abdominal pain.  The patient is presenting pain in the right lower rib cage and on the lateral aspect of the right upper quadrant.  Based on physical exam findings, this appears to be secondary to costochondritis for which she will be given naproxen 500 mg every 12 hours and she can use lidocaine patches as needed.  As she was also presenting side effects with the use of Wegovy, she should avoid restarting this medication.  -Start taking naproxen 500 mg twice a day -Can use lidocaine patches in the affected area every  12 hours -Avoid restarting Wegovy  All questions were answered.      Katrinka Blazing, MD Gastroenterology and Hepatology [facility]

## 2023-09-19 ENCOUNTER — Ambulatory Visit (HOSPITAL_COMMUNITY)
Admission: RE | Admit: 2023-09-19 | Discharge: 2023-09-19 | Disposition: A | Discharge status: Home / Self Care | Payer: BC Managed Care – PPO | Source: Ambulatory Visit | Attending: Family Medicine | Admitting: Family Medicine

## 2023-09-19 DIAGNOSIS — G8929 Other chronic pain: Secondary | ICD-10-CM | POA: Insufficient documentation

## 2023-09-19 DIAGNOSIS — R1011 Right upper quadrant pain: Secondary | ICD-10-CM | POA: Insufficient documentation

## 2023-10-02 ENCOUNTER — Encounter: Payer: Self-pay | Admitting: Family Medicine

## 2023-11-28 ENCOUNTER — Ambulatory Visit: Payer: BC Managed Care – PPO | Admitting: Family Medicine

## 2023-11-28 VITALS — BP 124/80 | HR 76 | Ht 66.0 in | Wt 222.1 lb

## 2023-11-28 DIAGNOSIS — J3089 Other allergic rhinitis: Secondary | ICD-10-CM | POA: Diagnosis not present

## 2023-11-28 DIAGNOSIS — I10 Essential (primary) hypertension: Secondary | ICD-10-CM

## 2023-11-28 DIAGNOSIS — J0140 Acute pansinusitis, unspecified: Secondary | ICD-10-CM

## 2023-11-28 DIAGNOSIS — J029 Acute pharyngitis, unspecified: Secondary | ICD-10-CM

## 2023-11-28 DIAGNOSIS — J019 Acute sinusitis, unspecified: Secondary | ICD-10-CM | POA: Insufficient documentation

## 2023-11-28 DIAGNOSIS — E66811 Obesity, class 1: Secondary | ICD-10-CM

## 2023-11-28 MED ORDER — METHYLPREDNISOLONE ACETATE 80 MG/ML IJ SUSP
40.0000 mg | Freq: Once | INTRAMUSCULAR | Status: AC
  Administered 2023-11-28: 40 mg via INTRAMUSCULAR

## 2023-11-28 MED ORDER — FLUTICASONE PROPIONATE 50 MCG/ACT NA SUSP: 2.0000 | Freq: Every day | NASAL | 6 refills | Status: DC

## 2023-11-28 MED ORDER — PREDNISONE 10 MG PO TABS: 10.0000 mg | ORAL_TABLET | Freq: Two times a day (BID) | ORAL | 0 refills | Status: DC

## 2023-11-28 MED ORDER — AZITHROMYCIN 250 MG PO TABS: ORAL_TABLET | ORAL | 0 refills | Status: AC

## 2023-11-28 MED ORDER — FLUCONAZOLE 150 MG PO TABS: 150.0000 mg | ORAL_TABLET | Freq: Once | ORAL | 0 refills | Status: AC

## 2023-11-28 NOTE — Assessment & Plan Note (Signed)
Rapid strep test in office

## 2023-11-28 NOTE — Progress Notes (Unsigned)
[name]     MRN: [id number]      DOB: 11/27/1961  Chief Complaint  Patient presents with   Sinusitis    Sinus drainage congestion x 2 weeks    HPI [name] is here with 2 week h/o increased head congestion drainage , clear to yellow, pressure over frontal and maxillary sinusesni ear pain, sore throat x 2 days, hoarse. Chills intermittent x 3 days, coughg ROS . Denies chest pains, palpitations and leg swelling Denies abdominal pain, nausea, vomiting,diarrhea or constipation.   Denies dysuria, frequency, hesitancy or incontinence. Denies joint pain, swelling and limitation in mobility. Denies headaches, seizures, numbness, or tingling. Denies depression, anxiety or insomnia. Denies skin break down or rash.   PE  BP 124/80 (BP Location: Right Arm, Patient Position: Sitting, Cuff Size: Large)   Pulse 76   Ht 5\' 6"  (1.676 m)   Wt 222 lb 1.9 oz (100.8 kg)   LMP 10/19/2011   SpO2 98%   BMI 35.85 kg/m   Patient alert and oriented and in no cardiopulmonary distress.  HEENT: No facial asymmetry, EOMI,     Neck supple .Frontal and maxillary sinus tenderness, ewrytema of poharynx no exiudate, bilateral cervical adenitis  Chest: Clear to auscultation bilaterally.  CVS: S1, S2 no murmurs, no S3.Regular rate.  ABD: Soft non tender.   Ext: No edema  MS: Adequate ROM spine, shoulders, hips and knees.  Skin: Intact, no ulcerations or rash noted.  Psych: Good eye contact, normal affect. Memory intact not anxious or depressed appearing.  CNS: CN 2-12 intact, power,  normal throughout.no focal deficits noted.   Assessment & Plan  Acute sinusitis Z pack  Acute pharyngitis Rapid strep test in office Is negative  Allergic rhinitis Uncontrolled , dep medrol 40 mg iM and 5 day course prednisone, daily flonase and zyrtec  HTN (hypertension) Controlled, no change in medication DASH diet and commitment to daily physical activity for a minimum of 30 minutes discussed and  encouraged, as a part of hypertension management. The importance of attaining a healthy weight is also discussed.     11/28/2023    9:53 AM 09/18/2023    3:38 PM 09/09/2023    4:08 PM 09/01/2023    7:12 PM 09/01/2023    4:52 PM 09/01/2023    4:51 PM 09/01/2023    4:50 PM  BP/Weight  Systolic BP 124 119 118 138 145 145 145  Diastolic BP 80 68 73 70 83 83 83  Wt. (Lbs) 222.12 222.9 219.12      BMI 35.85 kg/m2 35.98 kg/m2 35.37 kg/m2           Obesity (BMI 30.0-34.9)  Patient re-educated about  the importance of commitment to a  minimum of 150 minutes of exercise per week as able.  The importance of healthy food choices with portion control discussed, as well as eating regularly and within a 12 hour window most days. The need to choose "clean , green" food 50 to 75% of the time is discussed, as well as to make water the primary drink and set a goal of 64 ounces water daily.       11/28/2023    9:53 AM 09/18/2023    3:38 PM 09/09/2023    4:08 PM  Weight /BMI  Weight 222 lb 1.9 oz 222 lb 14.4 oz 219 lb 1.9 oz  Height 5\' 6"  (1.676 m) 5\' 6"  (1.676 m) 5\' 6"  (1.676 m)  BMI 35.85 kg/m2 35.98 kg/m2 35.37  kg/m2    WEIGHT GAIN, NEEDS TO RDUCE CARB INTAKE AND START MORE REGULAR EXERCISE

## 2023-11-28 NOTE — Patient Instructions (Signed)
Follow-up as before, call if you need me sooner.  You are treated for acute sinusitis pharyngitis and uncontrolled allergies.     Rapid strep test in the office today.  Depo-Medrol 40 mg IM in office today.    Medications are Prescribed and are  at pharmacy of your choice these are  prednisone azithromycin fluconazole and Flonase nasal spray.  Ensure you get adequate fluid intake as well as rest.  Best for the season and 2025!  Thanks for choosing [facility], we consider it a privelige to serve you.

## 2023-11-28 NOTE — Assessment & Plan Note (Signed)
Uncontrolled , dep medrol 40 mg iM and 5 day course prednisone, daily flonase and zyrtec

## 2023-11-28 NOTE — Assessment & Plan Note (Signed)
Zpack

## 2023-11-30 NOTE — Assessment & Plan Note (Signed)
Controlled, no change in medication DASH diet and commitment to daily physical activity for a minimum of 30 minutes discussed and encouraged, as a part of hypertension management. The importance of attaining a healthy weight is also discussed.     11/28/2023    9:53 AM 09/18/2023    3:38 PM 09/09/2023    4:08 PM 09/01/2023    7:12 PM 09/01/2023    4:52 PM 09/01/2023    4:51 PM 09/01/2023    4:50 PM  BP/Weight  Systolic BP 124 119 118 138 145 145 145  Diastolic BP 80 68 73 70 83 83 83  Wt. (Lbs) 222.12 222.9 219.12      BMI 35.85 kg/m2 35.98 kg/m2 35.37 kg/m2

## 2023-11-30 NOTE — Assessment & Plan Note (Signed)
  Patient re-educated about  the importance of commitment to a  minimum of 150 minutes of exercise per week as able.  The importance of healthy food choices with portion control discussed, as well as eating regularly and within a 12 hour window most days. The need to choose "clean , green" food 50 to 75% of the time is discussed, as well as to make water the primary drink and set a goal of 64 ounces water daily.       11/28/2023    9:53 AM 09/18/2023    3:38 PM 09/09/2023    4:08 PM  Weight /BMI  Weight 222 lb 1.9 oz 222 lb 14.4 oz 219 lb 1.9 oz  Height 5\' 6"  (1.676 m) 5\' 6"  (1.676 m) 5\' 6"  (1.676 m)  BMI 35.85 kg/m2 35.98 kg/m2 35.37 kg/m2    WEIGHT GAIN, NEEDS TO RDUCE CARB INTAKE AND START MORE REGULAR EXERCISE

## 2023-12-01 LAB — RAPID STREP SCREEN (MED CTR MEBANE ONLY): Strep Gp A Ag, IA W/Reflex: NEGATIVE

## 2023-12-01 LAB — CULTURE, GROUP A STREP

## 2023-12-12 ENCOUNTER — Ambulatory Visit: Payer: 59 | Admitting: Family Medicine

## 2023-12-23 ENCOUNTER — Encounter: Payer: Self-pay | Admitting: Family Medicine

## 2024-01-10 ENCOUNTER — Emergency Department (HOSPITAL_BASED_OUTPATIENT_CLINIC_OR_DEPARTMENT_OTHER): Payer: 59

## 2024-01-10 ENCOUNTER — Emergency Department (HOSPITAL_BASED_OUTPATIENT_CLINIC_OR_DEPARTMENT_OTHER): Payer: 59 | Admitting: Radiology

## 2024-01-10 ENCOUNTER — Encounter (HOSPITAL_BASED_OUTPATIENT_CLINIC_OR_DEPARTMENT_OTHER): Payer: Self-pay

## 2024-01-10 ENCOUNTER — Other Ambulatory Visit: Payer: Self-pay

## 2024-01-10 ENCOUNTER — Emergency Department (HOSPITAL_BASED_OUTPATIENT_CLINIC_OR_DEPARTMENT_OTHER)
Admission: EM | Admit: 2024-01-10 | Discharge: 2024-01-10 | Disposition: A | Discharge status: Home / Self Care | Payer: 59 | Attending: Emergency Medicine | Admitting: Emergency Medicine

## 2024-01-10 DIAGNOSIS — M25512 Pain in left shoulder: Secondary | ICD-10-CM | POA: Diagnosis present

## 2024-01-10 DIAGNOSIS — W01198A Fall on same level from slipping, tripping and stumbling with subsequent striking against other object, initial encounter: Secondary | ICD-10-CM | POA: Diagnosis not present

## 2024-01-10 DIAGNOSIS — M546 Pain in thoracic spine: Secondary | ICD-10-CM | POA: Insufficient documentation

## 2024-01-10 DIAGNOSIS — R519 Headache, unspecified: Secondary | ICD-10-CM | POA: Diagnosis not present

## 2024-01-10 DIAGNOSIS — M7918 Myalgia, other site: Secondary | ICD-10-CM

## 2024-01-10 MED ORDER — CYCLOBENZAPRINE HCL 5 MG PO TABS
5.0000 mg | ORAL_TABLET | Freq: Two times a day (BID) | ORAL | 0 refills | Status: DC | PRN
Start: 2024-01-10 — End: 2024-04-28

## 2024-01-10 NOTE — ED Notes (Signed)
 Patient transported to X-ray

## 2024-01-10 NOTE — ED Provider Notes (Signed)
[place] EMERGENCY DEPARTMENT AT [facility] Provider Note   CSN: [id number] Arrival date & time: 01/10/24  4098     History  Chief Complaint  Patient presents with   Shoulder Pain    [name] is a 63 y.o. female.  HPI   63 year old female presents emergency department after mechanical fall 1 week ago.  Patient states that she was in the bathroom and tripped over a rug falling onto her left upper back and hitting the back part of her head.  Since then she has had a mild posterior headache.  Denies any neck pain.  Is complaining of left shoulder, left upper back and mid back pain.  With the left shoulder pain patient had concern and wanted to rule out any type of cardiac pain.  But she feels that this is musculoskeletal.  Has been taking over-the-counter medications with mild relief.  Home Medications Prior to Admission medications   Medication Sig Start Date End Date Taking? Authorizing Provider  amLODipine (NORVASC) 10 MG tablet Take 1 tablet (10 mg total) by mouth daily. 06/25/23  Yes Kerri Perches, MD  Ascorbic Acid (VITAMIN C) 1000 MG tablet Take 1,000 mg by mouth daily.    [provider]  Biotin 1000 MCG CHEW Chew 1 tablet by mouth daily.    [provider]  fluticasone (FLONASE) 50 MCG/ACT nasal spray Place 2 sprays into both nostrils daily. 06/25/23   Kerri Perches, MD  fluticasone (FLONASE) 50 MCG/ACT nasal spray Place 2 sprays into both nostrils daily. 11/28/23   Kerri Perches, MD  Magnesium 250 MG TABS Take 250 mg by mouth as needed.    [provider]  Multiple Vitamin (MULTIVITAMIN) tablet Take 1 tablet by mouth daily.    [provider]  naproxen (NAPROSYN) 500 MG tablet Take 1 tablet (500 mg total) by mouth 2 (two) times daily with a meal. 09/18/23   Marguerita Merles, Reuel Boom, MD  OVER THE COUNTER MEDICATION Vit D once per day B12 once per day Calcium daily.    [provider]  predniSONE  (DELTASONE) 10 MG tablet Take 1 tablet (10 mg total) by mouth 2 (two) times daily with a meal. 11/28/23   Kerri Perches, MD  Semaglutide-Weight Management (WEGOVY) 0.25 MG/0.5ML SOAJ Inject 0.25 mg into the skin once a week. 06/25/23   Kerri Perches, MD      Allergies    Sulfamethoxazole-trimethoprim    Review of Systems   Review of Systems  Constitutional:  Negative for fever.  Respiratory:  Negative for shortness of breath.   Cardiovascular:  Negative for chest pain.  Gastrointestinal:  Negative for abdominal pain, diarrhea and vomiting.  Musculoskeletal:  Positive for back pain. Negative for neck pain and neck stiffness.       + left shoulder pain  Skin:  Negative for rash.  Neurological:  Positive for headaches.    Physical Exam Updated Vital Signs BP (!) 142/82 (BP Location: Right Arm)   Pulse 78   Temp 98.6 F (37 C) (Oral)   Resp 15   Ht 5\' 6"  (1.676 m)   Wt 100.7 kg   LMP 10/19/2011   SpO2 99%   BMI 35.83 kg/m  Physical Exam Vitals and nursing note reviewed.  Constitutional:      General: She is not in acute distress.    Appearance: Normal appearance. She is not ill-appearing.  HENT:     Head: Normocephalic.     Mouth/Throat:  Mouth: Mucous membranes are moist.  Eyes:     Pupils: Pupils are equal, round, and reactive to light.  Cardiovascular:     Rate and Rhythm: Normal rate.  Pulmonary:     Effort: Pulmonary effort is normal. No respiratory distress.  Abdominal:     Palpations: Abdomen is soft.     Tenderness: There is no abdominal tenderness.  Musculoskeletal:     Cervical back: No rigidity or tenderness.     Comments: Tenderness to palpation of the left trapezius muscle extending from the shoulder to the left paraspinal thoracic area.  No midline spinal tenderness, no step-offs.  Skin:    General: Skin is warm.  Neurological:     Mental Status: She is alert and oriented to person, place, and time. Mental status is at baseline.   Psychiatric:        Mood and Affect: Mood normal.     ED Results / Procedures / Treatments   Labs (all labs ordered are listed, but only abnormal results are displayed) Labs Reviewed - No data to display  EKG None  Radiology CT Head Wo Contrast Result Date: 01/10/2024 CLINICAL DATA:  Fall 1 week ago.  Ongoing posterior headache EXAM: CT HEAD WITHOUT CONTRAST TECHNIQUE: Contiguous axial images were obtained from the base of the skull through the vertex without intravenous contrast. RADIATION DOSE REDUCTION: This exam was performed according to the departmental dose-optimization program which includes automated exposure control, adjustment of the mA and/or kV according to patient size and/or use of iterative reconstruction technique. COMPARISON:  Brain MRI 01/01/2023 FINDINGS: Brain: No evidence of acute infarction, hemorrhage, hydrocephalus, extra-axial collection or mass lesion/mass effect. Vascular: No hyperdense vessel or unexpected calcification. Skull: Normal. Negative for fracture or focal lesion. Sinuses/Orbits: No acute finding. IMPRESSION: Negative head CT. Electronically Signed   By: Tiburcio Pea M.D.   On: 01/10/2024 08:43    Procedures Procedures    Medications Ordered in ED Medications - No data to display  ED Course/ Medical Decision Making/ A&P                                 Medical Decision Making Amount and/or Complexity of Data Reviewed Radiology: ordered.  Risk Prescription drug management.   63 year old female presents emergency department primarily left shoulder/upper back pain as well as a posterior headache, all stemming from a mechanical fall a week ago.  Left shoulder/back pain is reproducible on exam.  Requesting EKG to rule out cardiac etiology.  Vitals are normal and stable on arrival.  EKG is unchanged for the patient.  Low suspicion for ACS given the reproducible nature and symptom stemming from mechanical fall.  CT of the head and x-rays are  negative.  Will treat the patient symptomatically for what appears to be musculoskeletal pain.  Patient at this time appears safe and stable for discharge and close outpatient follow up. Discharge plan and strict return to ED precautions discussed, patient verbalizes understanding and agreement.        Final Clinical Impression(s) / ED Diagnoses Final diagnoses:  None    Rx / DC Orders ED Discharge Orders     None         Rozelle Logan, DO 01/10/24 1013

## 2024-01-10 NOTE — Discharge Instructions (Signed)
You have been seen and discharged from the emergency department.  Your EKG, CT of the head and x-rays were normal.  I believe you are suffering from musculoskeletal pain.  Take muscle relaxer as needed.  You can take this in combination with Tylenol, NSAID for pain control.  Do not mix this medication with alcohol or other sedating medications. Do not drive or do heavy physical activity until you know how this medication affects you.  It may cause drowsiness.  Follow-up with your primary provider for further evaluation and further care. Take home medications as prescribed. If you have any worsening symptoms or further concerns for your health please return to an emergency department for further evaluation.

## 2024-01-10 NOTE — ED Triage Notes (Signed)
In for eval of left arm/shoulder pain and right ear pain and head pain sec to fall 1 week ago. Bruising noted to right posterior upper arm. Denies LOC with fall. Denies blood thinners.

## 2024-04-20 ENCOUNTER — Telehealth: Payer: Self-pay | Admitting: Family Medicine

## 2024-04-20 NOTE — Telephone Encounter (Signed)
 Copied from CRM [id number]. Topic: Appointments - Scheduling Inquiry for Clinic >> Apr 20, 2024  2:51 PM Crispin Dolphin wrote: Reason for CRM: Patient called. State knee replacement surgery scheduled for 6/3. She has appt on 5/30 with another provider but would prefer to see Dr Rodolph Clap for surgical clearance. Adding to waitlist. If there is any way she is able to be seen by Dr Rodolph Clap prior to surgery could someone let her know. Thank You

## 2024-04-21 NOTE — Telephone Encounter (Signed)
 Noted. Will look out for cancellations

## 2024-04-27 ENCOUNTER — Telehealth: Payer: Self-pay

## 2024-04-27 NOTE — Telephone Encounter (Signed)
 Copied from CRM [id number]. Topic: Clinical - Medical Advice >> Apr 27, 2024  1:09 PM Donald Frost wrote: Reason for CRM: The patient called in needing a letter of medical necessity from her provider stating she needs knee replacement surgery. It is scheduled for June 3rd and she had blood work done today. She states she talked with her provider about it some last month. Please assist patient further. The surgery will be with [facility].

## 2024-04-27 NOTE — Telephone Encounter (Signed)
 Lvm to CB. If patient calls back please let her know I have added her to Dr. Doylene Genet waitlist and will call if anything opens up. But keep appt with Dr. Kermit Ped at this time

## 2024-04-28 ENCOUNTER — Ambulatory Visit: Admitting: Family Medicine

## 2024-04-28 ENCOUNTER — Encounter: Payer: Self-pay | Admitting: Family Medicine

## 2024-04-28 ENCOUNTER — Ambulatory Visit (HOSPITAL_COMMUNITY)
Admission: RE | Admit: 2024-04-28 | Discharge: 2024-04-28 | Disposition: A | Discharge status: Home / Self Care | Source: Ambulatory Visit | Attending: Family Medicine | Admitting: Family Medicine

## 2024-04-28 VITALS — BP 117/62 | HR 60 | Resp 18 | Ht 66.0 in | Wt 235.1 lb

## 2024-04-28 DIAGNOSIS — I10 Essential (primary) hypertension: Secondary | ICD-10-CM

## 2024-04-28 DIAGNOSIS — Z01818 Encounter for other preprocedural examination: Secondary | ICD-10-CM | POA: Insufficient documentation

## 2024-04-28 NOTE — Assessment & Plan Note (Signed)
 Controlled, no change in medication DASH diet and commitment to daily physical activity for a minimum of 30 minutes discussed and encouraged, as a part of hypertension management. The importance of attaining a healthy weight is also discussed.     04/28/2024    3:54 PM 01/10/2024    8:15 AM 01/10/2024    7:37 AM 01/10/2024    7:36 AM 11/28/2023    9:53 AM 09/18/2023    3:38 PM 09/09/2023    4:08 PM  BP/Weight  Systolic BP 117 129 142  124 119 118  Diastolic BP 62 79 82  80 68 73  Wt. (Lbs) 235.12   222 222.12 222.9 219.12  BMI 37.95 kg/m2   35.83 kg/m2 35.85 kg/m2 35.98 kg/m2 35.37 kg/m2

## 2024-04-28 NOTE — Patient Instructions (Addendum)
 F/U in 4 months, call if you need me sooner  CCUA  in office today and CXR  at hospital, you will be medically cleared once results are back   No med changes    It is important that you exercise regularly at least 30 minutes 5 times a week. If you develop chest pain, have severe difficulty breathing, or feel very tired, stop exercising immediately and seek medical attention   Thanks for choosing [facility], we consider it a privelige to serve you.

## 2024-04-28 NOTE — Assessment & Plan Note (Addendum)
 HISTORY AND EXAM AS DOCUMENTED EKG FROM FEB 2025 REVIEWED , NO ISCHEMIA, NO CARDIAC SYMPTOMS REPORTED  CXR, CCUA WITHIN NORMAL UPDATED LABS HAVE BEEN ORDERED BY ORTHO FOR THEIR REVIEW CLEARED FOR PROPOSED SURGERY

## 2024-04-29 ENCOUNTER — Encounter: Payer: Self-pay | Admitting: Family Medicine

## 2024-04-29 ENCOUNTER — Ambulatory Visit: Payer: Self-pay | Admitting: Family Medicine

## 2024-04-29 LAB — UA/M W/RFLX CULTURE, ROUTINE
Bilirubin, UA: NEGATIVE
Glucose, UA: NEGATIVE
Ketones, UA: NEGATIVE
Leukocytes,UA: NEGATIVE
Nitrite, UA: NEGATIVE
Protein,UA: NEGATIVE
RBC, UA: NEGATIVE
Specific Gravity, UA: 1.02 (ref 1.005–1.030)
Urobilinogen, Ur: 0.2 mg/dL (ref 0.2–1.0)
pH, UA: 6 (ref 5.0–7.5)

## 2024-04-29 LAB — MICROSCOPIC EXAMINATION
Bacteria, UA: NONE SEEN
Casts: NONE SEEN /LPF
Epithelial Cells (non renal): NONE SEEN /HPF (ref 0–10)
RBC, Urine: NONE SEEN /HPF (ref 0–2)
WBC, UA: NONE SEEN /HPF (ref 0–5)

## 2024-04-29 NOTE — Progress Notes (Signed)
   [name]     MRN: [id number]      DOB: 12/25/1960  Chief Complaint  Patient presents with   surgical clearance     Here for surgical clearance for left knee replacement surgery on 06/03    HPI [name] is here for medical clearance for left knee replacement scheduled for 05/11/2024 ROS is positive only for debilitating left knee pain limiting function   ROS Denies recent fever or chills. Denies sinus pressure, nasal congestion, ear pain or sore throat. Denies chest congestion, productive cough or wheezing. Denies chest pains, palpitations and leg swelling Denies abdominal pain, nausea, vomiting,diarrhea or constipation.   Denies dysuria, frequency, hesitancy or incontinence.  Denies headaches, seizures, numbness, or tingling. Denies depression, anxiety or insomnia. Denies skin break down or rash.   PE  BP 117/62   Pulse 60   Resp 18   Ht 5\' 6"  (1.676 m)   Wt 235 lb 1.9 oz (106.6 kg)   LMP 10/19/2011   SpO2 96%   BMI 37.95 kg/m   Patient alert and oriented and in no cardiopulmonary distress.  HEENT: No facial asymmetry, EOMI,     Neck supple .  Chest: Clear to auscultation bilaterally.  CVS: S1, S2 no murmurs, no S3.Regular rate.  ABD: Soft non tender.   Ext: No edema  MS: Adequate ROM spine, shoulders, hips and  reduced in knees.left greater than right  Skin: Intact, no ulcerations or rash noted.  Psych: Good eye contact, normal affect. Memory intact not anxious or depressed appearing.  CNS: CN 2-12 intact, power,  normal throughout.no focal deficits noted.   Assessment & Plan HTN (hypertension) Controlled, no change in medication DASH diet and commitment to daily physical activity for a minimum of 30 minutes discussed and encouraged, as a part of hypertension management. The importance of attaining a healthy weight is also discussed.     04/28/2024    3:54 PM 01/10/2024    8:15 AM 01/10/2024    7:37 AM 01/10/2024    7:36 AM 11/28/2023    9:53 AM  09/18/2023    3:38 PM 09/09/2023    4:08 PM  BP/Weight  Systolic BP 117 129 142  124 119 118  Diastolic BP 62 79 82  80 68 73  Wt. (Lbs) 235.12   222 222.12 222.9 219.12  BMI 37.95 kg/m2   35.83 kg/m2 35.85 kg/m2 35.98 kg/m2 35.37 kg/m2       Preop examination HISTORY AND EXAM AS DOCUMENTED EKG FROM FEB 2025 REVIEWED , NO ISCHEMIA, NO CARDIAC SYMPTOMS REPORTED  CXR, CCUA WITHIN NORMAL UPDATED LABS HAVE BEEN ORDERED BY ORTHO FOR THEIR REVIEW CLEARED FOR PROPOSED SURGERY

## 2024-05-04 NOTE — Telephone Encounter (Signed)
 Faxed letter on file.  Copied from CRM [id number]. Topic: General - Other >> May 04, 2024 10:54 AM Elle L wrote: Reason for CRM: [name] with [facility], [phone], is requesting for the patient's surgical clearance letter from the patient's appointment on 5/21 to be faxed to [phone].

## 2024-05-07 ENCOUNTER — Ambulatory Visit: Payer: Self-pay | Admitting: Internal Medicine

## 2024-05-14 ENCOUNTER — Encounter (HOSPITAL_COMMUNITY): Payer: Self-pay | Admitting: *Deleted

## 2024-07-27 ENCOUNTER — Other Ambulatory Visit: Payer: Self-pay | Admitting: Family Medicine

## 2024-08-10 ENCOUNTER — Telehealth: Payer: Self-pay | Admitting: Family Medicine

## 2024-08-10 NOTE — Telephone Encounter (Signed)
 Called patient left voicemail we have added her to a waiting list.

## 2024-08-10 NOTE — Telephone Encounter (Signed)
 Copied from CRM [id number]. Topic: Appointments - Appointment Cancel/Reschedule >> Aug 10, 2024  3:35 PM Montie POUR wrote: Patient/patient representative is calling to cancel or reschedule an appointment. Refer to attachments for appointment information.  Zayda rescheduled appointment and next available is Jan. 9, 2025. She wants to see if she can be seen sooner. Please call Catha at [phone] to schedule appointment sooner; she needs appointment to be after 3:00 PM.

## 2024-09-01 ENCOUNTER — Ambulatory Visit: Admitting: Family Medicine

## 2024-09-08 LAB — HM PAP SMEAR: HM Pap smear: NORMAL

## 2024-10-04 LAB — HM MAMMOGRAPHY

## 2024-10-06 ENCOUNTER — Encounter: Payer: Self-pay | Admitting: Family Medicine

## 2024-10-13 ENCOUNTER — Encounter (INDEPENDENT_AMBULATORY_CARE_PROVIDER_SITE_OTHER): Payer: Self-pay | Admitting: Gastroenterology

## 2024-11-18 ENCOUNTER — Other Ambulatory Visit: Payer: Self-pay | Admitting: Family Medicine

## 2024-12-17 ENCOUNTER — Ambulatory Visit: Admitting: Family Medicine

## 2024-12-29 ENCOUNTER — Encounter: Payer: Self-pay | Admitting: Family Medicine

## 2024-12-29 ENCOUNTER — Ambulatory Visit: Payer: Self-pay | Admitting: Family Medicine

## 2024-12-29 VITALS — BP 136/80 | HR 65 | Resp 18 | Ht 66.0 in | Wt 241.1 lb

## 2024-12-29 DIAGNOSIS — J019 Acute sinusitis, unspecified: Secondary | ICD-10-CM | POA: Diagnosis not present

## 2024-12-29 DIAGNOSIS — E559 Vitamin D deficiency, unspecified: Secondary | ICD-10-CM

## 2024-12-29 DIAGNOSIS — B9689 Other specified bacterial agents as the cause of diseases classified elsewhere: Secondary | ICD-10-CM | POA: Diagnosis not present

## 2024-12-29 DIAGNOSIS — E785 Hyperlipidemia, unspecified: Secondary | ICD-10-CM | POA: Diagnosis not present

## 2024-12-29 DIAGNOSIS — Z131 Encounter for screening for diabetes mellitus: Secondary | ICD-10-CM

## 2024-12-29 DIAGNOSIS — I10 Essential (primary) hypertension: Secondary | ICD-10-CM

## 2024-12-29 MED ORDER — FLUCONAZOLE 150 MG PO TABS: 150.0000 mg | ORAL_TABLET | Freq: Once | ORAL | 0 refills | Status: AC

## 2024-12-29 MED ORDER — WEGOVY 0.25 MG/0.5ML ~~LOC~~ SOAJ: 0.2500 mg | SUBCUTANEOUS | 0 refills | Status: AC

## 2024-12-29 MED ORDER — AZITHROMYCIN 250 MG PO TABS: ORAL_TABLET | ORAL | 0 refills | Status: AC

## 2024-12-29 MED ORDER — FLUTICASONE PROPIONATE 50 MCG/ACT NA SUSP: 2.0000 | Freq: Every day | NASAL | 6 refills | Status: AC

## 2024-12-29 NOTE — Progress Notes (Signed)
 "  [name]     MRN: [id number]      DOB: 29-Jul-1961  Chief Complaint  Patient presents with   Hypertension    4 month follow up    Nasal Congestion    Pt complains of lingering nasal congestion and drainage. Went to UC last week, still not fully better.     HPI [name] is here for follow up and re-evaluation of chronic medical conditions, medication management and review of any available recent lab and radiology data.  Preventive health is updated, specifically  Cancer screening and Immunization.   Questions or concerns regarding consultations or procedures which the PT has had in the interim are  addressed. The PT denies any adverse reactions to current medications since the last visit.  C/o nasal and sinus  congestion and pressure x 1 week, drainage at times thick and yellow  ROS C/o intermittent  chills. Denies , ear pain or sore throat. Denies chest congestion, productive cough or wheezing. Denies chest pains, palpitations and leg swelling Denies abdominal pain, nausea, vomiting,diarrhea or constipation.   Denies dysuria, frequency, hesitancy or incontinence. Denies uncontrolled  joint pain, swelling and limitation in mobility. Denies headaches, seizures, numbness, or tingling. Denies depression, anxiety or insomnia. Denies skin break down or rash.   PE BP 136/80   Pulse 65   Resp 18   Ht 5' 6 (1.676 m)   Wt 241 lb 1.9 oz (109.4 kg)   LMP 10/19/2011   SpO2 100%   BMI 38.92 kg/m    Patient alert and oriented and in no cardiopulmonary distress.  HEENT: No facial asymmetry, EOMI,     Neck supple .Maxillary sinus tenderness and nasal congestion, TM clear bilaterally  Chest: Clear to auscultation bilaterally.  CVS: S1, S2 no murmurs, no S3.Regular rate.  ABD: Soft non tender.   Ext: No edema  MS: Adequate ROM spine, shoulders, hips and knees.  Skin: Intact, no ulcerations or rash noted.  Psych: Good eye contact, normal affect. Memory intact not anxious  or depressed appearing.  CNS: CN 2-12 intact, power,  normal throughout.no focal deficits noted.   Assessment & Plan  Morbid obesity [facility])  Patient re-educated about  the importance of commitment to a  minimum of 150 minutes of exercise per week as able.  The importance of healthy food choices with portion control discussed, as well as eating regularly and within a 12 hour window most days. The need to choose clean , green food 50 to 75% of the time is discussed, as well as to make water  the primary drink and set a goal of 64 ounces water  daily.       12/29/2024    4:21 PM 04/28/2024    3:54 PM 01/10/2024    7:36 AM  Weight /BMI  Weight 241 lb 1.9 oz 235 lb 1.9 oz 222 lb  Height 5' 6 (1.676 m) 5' 6 (1.676 m) 5' 6 (1.676 m)  BMI 38.92 kg/m2 37.95 kg/m2 35.83 kg/m2    Deteriorated , wegovy  prescribedwill f/u to let me know if covered  HTN (hypertension) Controlled, no change in medication DASH diet and commitment to daily physical activity for a minimum of 30 minutes discussed and encouraged, as a part of hypertension management. The importance of attaining a healthy weight is also discussed.     12/29/2024    4:54 PM 12/29/2024    4:21 PM 04/28/2024    3:54 PM 01/10/2024    8:15 AM 01/10/2024  7:37 AM 01/10/2024    7:36 AM 11/28/2023    9:53 AM  BP/Weight  Systolic BP 136 144 117 129 142  124  Diastolic BP 80 80 62 79 82  80  Wt. (Lbs)  241.12 235.12   222 222.12  BMI  38.92 kg/m2 37.95 kg/m2   35.83 kg/m2 35.85 kg/m2       Acute bacterial rhinosinusitis Zpack prescribed and saline flushes encouraged  Dyslipidemia Hyperlipidemia:Low fat diet discussed and encouraged.   Lipid Panel  Lab Results  Component Value Date   CHOL 186 07/01/2023   HDL 76 07/01/2023   LDLCALC 100 (H) 07/01/2023   TRIG 52 07/01/2023   CHOLHDL 2.4 07/01/2023     Updated lab needed at/ before next visit. Needs to reduce fat in diet  Vitamin D  deficiency Updated lab needed at/  before next visit.    "

## 2024-12-29 NOTE — Patient Instructions (Addendum)
 Nurse pls enter pap 09/2025 historic , Normal ( cousins)  Nurse visit for pneumonia vaccine in 3 to 4 weeks F/U in 4  months  You are treated for Sinus infections with antibiotic  ( azithromycin )  Fasting CBC, lipid, cmp and EGFr, tSH, and vit D, HBa1C  in am  You are being referred for education to manage weight  It is important that you exercise regularly at least 30 minutes 5 times a week. If you develop chest pain, have severe difficulty breathing, or feel very tired, stop exercising immediately and seek medical attention   New is once weekly wegovy , send message re coverage pls  Thanks for choosing [facility], we consider it a privelige to serve you.

## 2025-01-01 ENCOUNTER — Encounter: Payer: Self-pay | Admitting: Family Medicine

## 2025-01-01 DIAGNOSIS — Z131 Encounter for screening for diabetes mellitus: Secondary | ICD-10-CM | POA: Insufficient documentation

## 2025-01-01 DIAGNOSIS — B9689 Other specified bacterial agents as the cause of diseases classified elsewhere: Secondary | ICD-10-CM | POA: Insufficient documentation

## 2025-01-01 NOTE — Assessment & Plan Note (Signed)
 Hyperlipidemia:Low fat diet discussed and encouraged.   Lipid Panel  Lab Results  Component Value Date   CHOL 186 07/01/2023   HDL 76 07/01/2023   LDLCALC 100 (H) 07/01/2023   TRIG 52 07/01/2023   CHOLHDL 2.4 07/01/2023     Updated lab needed at/ before next visit. Needs to reduce fat in diet

## 2025-01-01 NOTE — Assessment & Plan Note (Signed)
 Controlled, no change in medication DASH diet and commitment to daily physical activity for a minimum of 30 minutes discussed and encouraged, as a part of hypertension management. The importance of attaining a healthy weight is also discussed.     12/29/2024    4:54 PM 12/29/2024    4:21 PM 04/28/2024    3:54 PM 01/10/2024    8:15 AM 01/10/2024    7:37 AM 01/10/2024    7:36 AM 11/28/2023    9:53 AM  BP/Weight  Systolic BP 136 144 117 129 142  124  Diastolic BP 80 80 62 79 82  80  Wt. (Lbs)  241.12 235.12   222 222.12  BMI  38.92 kg/m2 37.95 kg/m2   35.83 kg/m2 35.85 kg/m2

## 2025-01-01 NOTE — Assessment & Plan Note (Signed)
" °  Patient re-educated about  the importance of commitment to a  minimum of 150 minutes of exercise per week as able.  The importance of healthy food choices with portion control discussed, as well as eating regularly and within a 12 hour window most days. The need to choose clean , green food 50 to 75% of the time is discussed, as well as to make water  the primary drink and set a goal of 64 ounces water  daily.       12/29/2024    4:21 PM 04/28/2024    3:54 PM 01/10/2024    7:36 AM  Weight /BMI  Weight 241 lb 1.9 oz 235 lb 1.9 oz 222 lb  Height 5' 6 (1.676 m) 5' 6 (1.676 m) 5' 6 (1.676 m)  BMI 38.92 kg/m2 37.95 kg/m2 35.83 kg/m2    Deteriorated , wegovy  prescribedwill f/u to let me know if covered "

## 2025-01-01 NOTE — Assessment & Plan Note (Signed)
 Zpack prescribed and saline flushes encouraged

## 2025-01-01 NOTE — Assessment & Plan Note (Signed)
 Updated lab needed at/ before next visit.

## 2025-04-29 ENCOUNTER — Ambulatory Visit: Payer: Self-pay | Admitting: Family Medicine
